# Patient Record
Sex: Female | Born: 1940 | Race: Black or African American | Hispanic: No | Smoking: Never smoker
Health system: Southern US, Community
[De-identification: ages and names within clinical notes are randomized; demographics above are authoritative.]

## PROBLEM LIST (undated history)

## (undated) DIAGNOSIS — F329 Major depressive disorder, single episode, unspecified: Secondary | ICD-10-CM

## (undated) DIAGNOSIS — E785 Hyperlipidemia, unspecified: Secondary | ICD-10-CM

## (undated) DIAGNOSIS — R350 Frequency of micturition: Secondary | ICD-10-CM

## (undated) DIAGNOSIS — K219 Gastro-esophageal reflux disease without esophagitis: Secondary | ICD-10-CM

## (undated) DIAGNOSIS — F0390 Unspecified dementia without behavioral disturbance: Secondary | ICD-10-CM

## (undated) DIAGNOSIS — I10 Essential (primary) hypertension: Secondary | ICD-10-CM

## (undated) DIAGNOSIS — K759 Inflammatory liver disease, unspecified: Secondary | ICD-10-CM

## (undated) DIAGNOSIS — F419 Anxiety disorder, unspecified: Secondary | ICD-10-CM

## (undated) DIAGNOSIS — F32A Depression, unspecified: Secondary | ICD-10-CM

## (undated) DIAGNOSIS — M810 Age-related osteoporosis without current pathological fracture: Secondary | ICD-10-CM

## (undated) DIAGNOSIS — H01009 Unspecified blepharitis unspecified eye, unspecified eyelid: Secondary | ICD-10-CM

## (undated) DIAGNOSIS — T783XXA Angioneurotic edema, initial encounter: Secondary | ICD-10-CM

## (undated) DIAGNOSIS — T464X5A Adverse effect of angiotensin-converting-enzyme inhibitors, initial encounter: Secondary | ICD-10-CM

## (undated) DIAGNOSIS — R011 Cardiac murmur, unspecified: Secondary | ICD-10-CM

## (undated) HISTORY — DX: Essential (primary) hypertension: I10

## (undated) HISTORY — DX: Depression, unspecified: F32.A

## (undated) HISTORY — DX: Angioneurotic edema, initial encounter: T78.3XXA

## (undated) HISTORY — DX: Adverse effect of angiotensin-converting-enzyme inhibitors, initial encounter: T46.4X5A

## (undated) HISTORY — DX: Hyperlipidemia, unspecified: E78.5

## (undated) HISTORY — DX: Anxiety disorder, unspecified: F41.9

## (undated) HISTORY — DX: Major depressive disorder, single episode, unspecified: F32.9

## (undated) HISTORY — PX: THYROIDECTOMY: SHX17

---

## 1997-04-07 ENCOUNTER — Encounter: Admission: RE | Admit: 1997-04-07 | Discharge: 1997-04-07 | Payer: Self-pay | Admitting: Sports Medicine

## 1997-09-15 ENCOUNTER — Encounter: Admission: RE | Admit: 1997-09-15 | Discharge: 1997-09-15 | Payer: Self-pay | Admitting: Family Medicine

## 1998-03-06 ENCOUNTER — Emergency Department (HOSPITAL_COMMUNITY): Admission: EM | Admit: 1998-03-06 | Discharge: 1998-03-06 | Payer: Self-pay | Admitting: Emergency Medicine

## 1998-06-03 ENCOUNTER — Ambulatory Visit (HOSPITAL_COMMUNITY): Admission: RE | Admit: 1998-06-03 | Discharge: 1998-06-03 | Payer: Self-pay | Admitting: Gastroenterology

## 1998-09-02 ENCOUNTER — Emergency Department (HOSPITAL_COMMUNITY): Admission: EM | Admit: 1998-09-02 | Discharge: 1998-09-02 | Payer: Self-pay | Admitting: Emergency Medicine

## 1999-08-05 ENCOUNTER — Other Ambulatory Visit: Admission: RE | Admit: 1999-08-05 | Discharge: 1999-08-05 | Payer: Self-pay | Admitting: Internal Medicine

## 1999-08-12 ENCOUNTER — Ambulatory Visit (HOSPITAL_COMMUNITY): Admission: RE | Admit: 1999-08-12 | Discharge: 1999-08-12 | Payer: Self-pay | Admitting: Internal Medicine

## 1999-08-12 ENCOUNTER — Encounter: Payer: Self-pay | Admitting: Internal Medicine

## 1999-08-24 ENCOUNTER — Encounter: Admission: RE | Admit: 1999-08-24 | Discharge: 1999-08-24 | Payer: Self-pay | Admitting: Internal Medicine

## 1999-08-24 ENCOUNTER — Encounter: Payer: Self-pay | Admitting: Internal Medicine

## 2000-08-28 ENCOUNTER — Other Ambulatory Visit: Admission: RE | Admit: 2000-08-28 | Discharge: 2000-08-28 | Payer: Self-pay | Admitting: Internal Medicine

## 2000-10-02 ENCOUNTER — Encounter: Admission: RE | Admit: 2000-10-02 | Discharge: 2000-10-02 | Payer: Self-pay | Admitting: Internal Medicine

## 2000-10-02 ENCOUNTER — Encounter: Payer: Self-pay | Admitting: Internal Medicine

## 2000-10-02 ENCOUNTER — Encounter: Admission: RE | Admit: 2000-10-02 | Discharge: 2000-12-31 | Payer: Self-pay | Admitting: Internal Medicine

## 2001-11-26 ENCOUNTER — Encounter: Admission: RE | Admit: 2001-11-26 | Discharge: 2001-11-26 | Payer: Self-pay | Admitting: Family Medicine

## 2001-11-26 ENCOUNTER — Encounter: Payer: Self-pay | Admitting: Family Medicine

## 2003-09-12 ENCOUNTER — Emergency Department (HOSPITAL_COMMUNITY): Admission: EM | Admit: 2003-09-12 | Discharge: 2003-09-12 | Payer: Self-pay | Admitting: Emergency Medicine

## 2005-03-04 ENCOUNTER — Encounter: Admission: RE | Admit: 2005-03-04 | Discharge: 2005-03-04 | Payer: Self-pay | Admitting: Family Medicine

## 2005-03-21 ENCOUNTER — Emergency Department (HOSPITAL_COMMUNITY): Admission: EM | Admit: 2005-03-21 | Discharge: 2005-03-22 | Payer: Self-pay | Admitting: Emergency Medicine

## 2005-04-25 ENCOUNTER — Emergency Department (HOSPITAL_COMMUNITY): Admission: EM | Admit: 2005-04-25 | Discharge: 2005-04-26 | Payer: Self-pay | Admitting: Emergency Medicine

## 2005-10-05 ENCOUNTER — Other Ambulatory Visit: Admission: RE | Admit: 2005-10-05 | Discharge: 2005-10-05 | Payer: Self-pay | Admitting: Family Medicine

## 2006-03-29 ENCOUNTER — Encounter: Admission: RE | Admit: 2006-03-29 | Discharge: 2006-03-29 | Payer: Self-pay | Admitting: Family Medicine

## 2007-05-02 ENCOUNTER — Encounter: Admission: RE | Admit: 2007-05-02 | Discharge: 2007-05-02 | Payer: Self-pay | Admitting: Family Medicine

## 2007-05-03 ENCOUNTER — Emergency Department (HOSPITAL_COMMUNITY): Admission: EM | Admit: 2007-05-03 | Discharge: 2007-05-03 | Payer: Self-pay | Admitting: Emergency Medicine

## 2009-01-12 ENCOUNTER — Encounter: Payer: Self-pay | Admitting: Family Medicine

## 2009-01-12 ENCOUNTER — Ambulatory Visit: Payer: Self-pay | Admitting: Family Medicine

## 2009-01-12 DIAGNOSIS — Z9089 Acquired absence of other organs: Secondary | ICD-10-CM | POA: Insufficient documentation

## 2009-01-12 DIAGNOSIS — I10 Essential (primary) hypertension: Secondary | ICD-10-CM

## 2009-01-12 DIAGNOSIS — R093 Abnormal sputum: Secondary | ICD-10-CM

## 2009-01-12 DIAGNOSIS — F324 Major depressive disorder, single episode, in partial remission: Secondary | ICD-10-CM

## 2009-01-12 DIAGNOSIS — Z794 Long term (current) use of insulin: Secondary | ICD-10-CM

## 2009-01-12 DIAGNOSIS — E1165 Type 2 diabetes mellitus with hyperglycemia: Secondary | ICD-10-CM

## 2009-01-12 DIAGNOSIS — E785 Hyperlipidemia, unspecified: Secondary | ICD-10-CM | POA: Insufficient documentation

## 2009-01-12 DIAGNOSIS — F411 Generalized anxiety disorder: Secondary | ICD-10-CM | POA: Insufficient documentation

## 2009-01-12 HISTORY — DX: Essential (primary) hypertension: I10

## 2009-01-12 LAB — CONVERTED CEMR LAB
ALT: 32 units/L (ref 0–35)
Alkaline Phosphatase: 99 units/L (ref 39–117)
BUN: 20 mg/dL (ref 6–23)
Creatinine, Ser: 1.05 mg/dL (ref 0.40–1.20)
Sodium: 143 meq/L (ref 135–145)
Total CHOL/HDL Ratio: 3.6
Triglycerides: 169 mg/dL — ABNORMAL HIGH (ref <150)
VLDL: 34 mg/dL (ref 0–40)

## 2009-01-28 ENCOUNTER — Ambulatory Visit: Payer: Self-pay | Admitting: Family Medicine

## 2009-01-28 LAB — CONVERTED CEMR LAB
Blood in Urine, dipstick: NEGATIVE
Nitrite: NEGATIVE
Protein, U semiquant: NEGATIVE
Urobilinogen, UA: 0.2

## 2009-04-28 ENCOUNTER — Telehealth: Payer: Self-pay | Admitting: *Deleted

## 2009-05-15 ENCOUNTER — Ambulatory Visit: Payer: Self-pay | Admitting: Family Medicine

## 2009-05-15 ENCOUNTER — Encounter: Payer: Self-pay | Admitting: Family Medicine

## 2009-05-15 DIAGNOSIS — K5900 Constipation, unspecified: Secondary | ICD-10-CM | POA: Insufficient documentation

## 2009-05-15 LAB — CONVERTED CEMR LAB
AST: 36 units/L (ref 0–37)
CO2: 24 meq/L (ref 19–32)
Calcium: 9.1 mg/dL (ref 8.4–10.5)
Chloride: 104 meq/L (ref 96–112)
Hgb A1c MFr Bld: 7.1 %
Sodium: 141 meq/L (ref 135–145)
Total Bilirubin: 0.7 mg/dL (ref 0.3–1.2)
Total Protein: 7.4 g/dL (ref 6.0–8.3)

## 2009-05-19 ENCOUNTER — Encounter: Admission: RE | Admit: 2009-05-19 | Discharge: 2009-05-19 | Payer: Self-pay | Admitting: Family Medicine

## 2009-09-21 ENCOUNTER — Encounter: Payer: Self-pay | Admitting: Family Medicine

## 2009-09-21 ENCOUNTER — Ambulatory Visit: Payer: Self-pay | Admitting: Family Medicine

## 2009-09-21 DIAGNOSIS — M810 Age-related osteoporosis without current pathological fracture: Secondary | ICD-10-CM | POA: Insufficient documentation

## 2009-09-21 LAB — CONVERTED CEMR LAB
Creatinine, Ser: 1.11 mg/dL (ref 0.40–1.20)
Glucose, Bld: 209 mg/dL — ABNORMAL HIGH (ref 70–99)
Hgb A1c MFr Bld: 8 %
Vit D, 25-Hydroxy: 56 ng/mL (ref 30–89)

## 2009-09-30 ENCOUNTER — Ambulatory Visit: Payer: Self-pay | Admitting: Family Medicine

## 2009-09-30 DIAGNOSIS — G309 Alzheimer's disease, unspecified: Secondary | ICD-10-CM

## 2009-09-30 DIAGNOSIS — F411 Generalized anxiety disorder: Secondary | ICD-10-CM | POA: Insufficient documentation

## 2009-09-30 DIAGNOSIS — F0281 Dementia in other diseases classified elsewhere with behavioral disturbance: Secondary | ICD-10-CM

## 2009-10-20 ENCOUNTER — Telehealth: Payer: Self-pay | Admitting: Family Medicine

## 2009-10-30 ENCOUNTER — Encounter: Payer: Self-pay | Admitting: Family Medicine

## 2009-11-25 ENCOUNTER — Ambulatory Visit: Payer: Self-pay | Admitting: Family Medicine

## 2009-11-25 ENCOUNTER — Encounter: Payer: Self-pay | Admitting: Family Medicine

## 2009-11-25 LAB — CONVERTED CEMR LAB
Creatinine, Ser: 1.1 mg/dL (ref 0.40–1.20)
Potassium: 3.5 meq/L (ref 3.5–5.3)
Sodium: 143 meq/L (ref 135–145)

## 2010-01-24 ENCOUNTER — Encounter: Payer: Self-pay | Admitting: Family Medicine

## 2010-02-02 NOTE — Assessment & Plan Note (Signed)
Summary: Mood, memory, hypertension    Vital Signs:  Patient profile:   70 year old female Height:      64 inches Weight:      122 pounds BMI:     21.02 BSA:     1.59 Temp:     98.1 degrees F Pulse rate:   69 / minute BP sitting:   141 / 76  Vitals Entered By: Jone Baseman CMA (September 30, 2009 10:29 AM) CC: Discuss memory, Hypertension Management, Depression Is Patient Diabetic? No Pain Assessment Patient in pain? no        Primary Care Provider:  Doree Albee MD  CC:  Discuss memory, Hypertension Management, and Depression.  History of Present Illness: 62 YOF with PMHx/o hypertension, diabetes, recent diagnosis of generalized anxiety here for followup visit on hypetension and memory issues.    Depression History:      The patient comes in today for her first follow up visit for depression.  The patient denies a depressed mood most of the day and a diminished interest in her usual daily activities.  The patient denies insomnia, hypersomnia, psychomotor agitation, fatigue (loss of energy), and feelings of worthlessness (guilt).        Comments:  Mood: Pt/daughter report moderate improvement in mood since starting buspar. Feels that medication has helped.  No HI/SI per pt. .  Hypertension History:      She denies headache, chest pain, palpitations, dyspnea with exertion, orthopnea, peripheral edema, and visual symptoms.        Positive major cardiovascular risk factors include female age 73 years old or older, diabetes, hyperlipidemia, and hypertension.  Negative major cardiovascular risk factors include non-tobacco-user status.       Habits & Providers  Alcohol-Tobacco-Diet     Tobacco Status: never  Current Medications (verified): 1)  Hydrochlorothiazide 25 Mg Tabs (Hydrochlorothiazide) .... Take 1 Tablet Daily 2)  Protonix 40 Mg Tbec (Pantoprazole Sodium) .... Take 1 Tablet Daily 3)  Aspir-Low 81 Mg Tbec (Aspirin) 4)  Buspirone Hcl 10 Mg Tabs (Buspirone  Hcl) .... Take 1 Tablet Twice Daily 5)  Miralax  Powd (Polyethylene Glycol 3350) .Marland Kitchen.. 1 Capful in 8 Ozs Water Daily 6)  Aricept 5 Mg Tabs (Donepezil Hcl) .... Take 1 Tablet Nightly  Allergies: No Known Drug Allergies  Physical Exam  General:  alert.   Head:  normocephalic and atraumatic.   Eyes:  vision grossly intact; glasses in place  Ears:  R ear normal and L ear normal.   Nose:  no external deformity.   Mouth:  good dentition.   Neck:  supple and full ROM.   Lungs:  CTAB, no wheezes, rales, rhoncii  Heart:  RRR, no rubs, gallops, murmurs  Abdomen:  S/NT/+ bowel sounds  Extremities:  2+ peripheral pulses, trace edema  Neurologic:  alert & oriented X3.    Diabetes Management Exam:    Foot Exam (with socks and/or shoes not present):       Sensory-Pinprick/Light touch:          Left medial foot (L-4): normal          Left dorsal foot (L-5): normal          Left lateral foot (S-1): normal          Right medial foot (L-4): normal          Right dorsal foot (L-5): normal          Right lateral foot (S-1): normal  Sensory-Monofilament:          Left foot: normal          Right foot: normal       Inspection:          Left foot: normal          Right foot: normal       Nails:          Left foot: normal          Right foot: normal   Impression & Recommendations:  Problem # 1:  ANXIETY DISORDER, GENERALIZED (ICD-300.02) Overall mood improved. Will reassess at next clinical visit in 1 month.  Her updated medication list for this problem includes:    Buspirone Hcl 10 Mg Tabs (Buspirone hcl) .Marland Kitchen... Take 1 tablet twice daily  Problem # 2:  DEMENTIA, MILD (ICD-294.8) MMSE 22/28. Will start patient aricept for memory. Will reassess for improvement in overall memory at next visit and subsequent visits. Also addressed side effect profile of aricept with patient. Pt/daughter agreeable to plan.   Problem # 3:  HYPERTENSION (ICD-401.9) Blood pressures minimally elevated will  consider starting on norvasc if SBP >140 at next clinical visit. Pt agreeable to plan.  Her updated medication list for this problem includes:    Hydrochlorothiazide 25 Mg Tabs (Hydrochlorothiazide) .Marland Kitchen... Take 1 tablet daily  Problem # 4:  DIABETES MELLITUS, TYPE II (ICD-250.00) Overall CBGs in mid-upper 100s. Reencouraged tid-qid blood sugar testing as well as low-carb diet.  Pt diet controlled in the past. Will reassess need for medical management with nexy A1C. Will also refer to podiatry.  Her updated medication list for this problem includes:    Aspir-low 81 Mg Tbec (Aspirin)  Orders: Podiatry Referral (Podiatry)  Complete Medication List: 1)  Hydrochlorothiazide 25 Mg Tabs (Hydrochlorothiazide) .... Take 1 tablet daily 2)  Protonix 40 Mg Tbec (Pantoprazole sodium) .... Take 1 tablet daily 3)  Aspir-low 81 Mg Tbec (Aspirin) 4)  Buspirone Hcl 10 Mg Tabs (Buspirone hcl) .... Take 1 tablet twice daily 5)  Miralax Powd (Polyethylene glycol 3350) .Marland Kitchen.. 1 capful in 8 ozs water daily 6)  Aricept 5 Mg Tabs (Donepezil hcl) .... Take 1 tablet nightly  Hypertension Assessment/Plan:      The patient's hypertensive risk group is category C: Target organ damage and/or diabetes.  Her calculated 10 year risk of coronary heart disease is 24 %.  Today's blood pressure is 141/76.  Her blood pressure goal is < 140/80.  Patient Instructions: 1)  It was good to see you again today 2)  We will start you on medication for your memory 3)  Check your blood sugars and blood pressures everyday 4)  Your mood seems to be better  5)  Come back to see me in 1 month to see if your mood has improved 6)  Otherwise call for any questions Prescriptions: ARICEPT 5 MG TABS (DONEPEZIL HCL) take 1 tablet nightly  #30 x 3   Entered and Authorized by:   Doree Albee MD   Signed by:   Doree Albee MD on 09/30/2009   Method used:   Electronically to        [facility] [phone]* (retail)       [address]       [place], [place]  [address]       Ph: [phone]       Fax: [phone]   RxID:   [id number]

## 2010-02-02 NOTE — Assessment & Plan Note (Signed)
Summary: f/u,df   Vital Signs:  Patient profile:   70 year old female Height:      64 inches Weight:      120 pounds BMI:     20.67 Temp:     98.3 degrees F oral Pulse rate:   96 / minute BP sitting:   131 / 82  (right arm) Cuff size:   regular  Vitals Entered By: Tessie Fass, CMA CC: F/U diabetes Is Patient Diabetic? Yes Pain Assessment Patient in pain? no        Primary Care Provider:  Doree Albee MD  CC:  F/U diabetes.  History of Present Illness: 1 YOF w/ PMHx/o HTN, DM, GERD here for new patient visit followup: 1. HTN: BP much improved in setting of starting HCTZ; almost at goal. Pt asymptomatic at baseline, and currently w/o symptomatology. Pt is asking about eye check as pt has not been to eye doctor in several years. Pt has been wearing glasses "for as long as I can remember" with no acute worsening of vision since dx of HTN and DM.  2. DM: HbA1C at 7.0 01/12/09. Pt states that blood sugars are usually very good.  3. GERD: Pt initially presented at new pt visit w/ complaint of persistent oral secretions, especially after eating in setting of remote hx/o reflux in past. Pt with marked improvement in symptomatology since beginning daily protonix.     Habits & Providers  Alcohol-Tobacco-Diet     Tobacco Status: never  Allergies: No Known Drug Allergies  Physical Exam  General:  alert and underweight appearing.   Head:  normocephalic and atraumatic.   Eyes:  vision grossly intact.  + glasses  Nose:  no external deformity.   Mouth:  good dentition and no gingival abnormalities.   Neck:  supple and full ROM.   Lungs:  normal respiratory effort, no intercostal retractions, no accessory muscle use, and normal breath sounds.   Heart:  normal rate, regular rhythm, no murmur, and no gallop.   Abdomen:  soft, non-tender, and normal bowel sounds.   Extremities:  no edema Neurologic:  alert & oriented X3.  neuro exam grossly intact.    Impression &  Recommendations:  Problem # 1:  HYPERTENSION (ICD-401.9) BP currently at goal. Will check UA for proteinuria to assess pt need for ACEi for renal protection. Otherwise pt will followup in 3-6 months.  Her updated medication list for this problem includes:    Hydrochlorothiazide 25 Mg Tabs (Hydrochlorothiazide) .Marland Kitchen... Take 1 tablet daily  Orders: Urinalysis-[facility] (00000) [facility]- Est Level  3 ([address])  Problem # 2:  ABNORMAL SPUTUM (ICD-786.4) Likely secondary to GERD in setting of protonix regimen, WIll continue current regimen.   Problem # 3:  DIABETES MELLITUS, TYPE II (ICD-250.00) Stable. Will reassess in 3-6 months.  Orders: Ophthalmology Referral (Ophthalmology) Urinalysis-[facility] (00000) [facility]- Est Level  3 ([address]454)  Problem # 4:  Preventive Health Care (ICD-V70.0) Will obtain records from office of Dr. Laurann Montana as pt had longstanding care from this primary care physician.   Complete Medication List: 1)  Hydrochlorothiazide 25 Mg Tabs (Hydrochlorothiazide) .... Take 1 tablet daily 2)  Protonix 40 Mg Tbec (Pantoprazole sodium) .... Take 1 tablet daily   Prevention & Chronic Care Immunizations   Influenza vaccine: Fluvax MCR  (01/12/2009)    Tetanus booster: Not documented    Pneumococcal vaccine: Pneumovax  (01/12/2009)    H. zoster vaccine: Not documented  Colorectal Screening   Hemoccult: Not documented  Colonoscopy: Not documented  Other Screening   Pap smear: Not documented    Mammogram: Not documented    DXA bone density scan: Not documented   Smoking status: never  (01/28/2009)  Diabetes Mellitus   HgbA1C: 7.0  (01/12/2009)    Eye exam: Not documented   Diabetic eye exam action/deferral: Ophthalmology referral  (01/28/2009)    Foot exam: yes  (01/12/2009)   High risk foot: Not documented   Foot care education: Not documented    Urine microalbumin/creatinine ratio: Not documented    Diabetes flowsheet reviewed?: Yes   Progress toward A1C goal:  Unchanged  Lipids   Total Cholesterol: 206  (01/12/2009)   Lipid panel action/deferral: Lipid Panel ordered   LDL: 115  (01/12/2009)   LDL Direct: Not documented   HDL: 57  (01/12/2009)   Triglycerides: 169  (01/12/2009)    SGOT (AST): 33  (01/12/2009)   SGPT (ALT): 32  (01/12/2009)   Alkaline phosphatase: 99  (01/12/2009)   Total bilirubin: 0.5  (01/12/2009)    Lipid flowsheet reviewed?: Yes   Progress toward LDL goal: Unchanged  Hypertension   Last Blood Pressure: 131 / 82  (01/28/2009)   Serum creatinine: 1.05  (01/12/2009)   Serum potassium 4.2  (01/12/2009)    Hypertension flowsheet reviewed?: Yes   Progress toward BP goal: At goal  Self-Management Support :    Diabetes self-management support: Written self-care plan  (01/28/2009)   Diabetes care plan printed    Hypertension self-management support: Written self-care plan  (01/28/2009)   Hypertension self-care plan printed.    Lipid self-management support: Written self-care plan  (01/28/2009)   Lipid self-care plan printed.   Nursing Instructions: Refer for screening diabetic eye exam (see order)   Laboratory Results   Urine Tests  Date/Time Received: January 28, 2009 4:27 PM  Date/Time Reported: January 28, 2009 4:32 PM   Routine Urinalysis   Color: yellow Appearance: Clear Glucose: negative   (Normal Range: Negative) Bilirubin: negative   (Normal Range: Negative) Ketone: trace (5)   (Normal Range: Negative) Spec. Gravity: 1.025   (Normal Range: 1.003-1.035) Blood: negative   (Normal Range: Negative) pH: 5.5   (Normal Range: 5.0-8.0) Protein: negative   (Normal Range: Negative) Urobilinogen: 0.2   (Normal Range: 0-1) Nitrite: negative   (Normal Range: Negative) Leukocyte Esterace: negative   (Normal Range: Negative)    Comments: ...............test performed by......Marland KitchenBonnie A. [place], MLS (ASCP)cm

## 2010-02-02 NOTE — Assessment & Plan Note (Signed)
Summary: F/U VISIT/[facility]   Vital Signs:  Patient profile:   70 year old female Height:      64 inches Weight:      124 pounds BMI:     21.36 Temp:     98.5 degrees F oral Pulse rate:   88 / minute BP sitting:   145 / 75  (left arm) Cuff size:   regular  Vitals Entered By: Tessie Fass CMA (September 21, 2009 1:47 PM) CC: F/U, Depression, Hypertension Management Is Patient Diabetic? Yes Pain Assessment Patient in pain? no        Primary Care Provider:  Doree Albee MD  CC:  F/U, Depression, and Hypertension Management.  History of Present Illness: 83 YOF w/ multiple medical problems here for followup visit: DM: Pt denies polyuria, polydypsia, hyperphagia or other systemic sxs. Has recently picked up sugar intake per daughter; drinking sweetened drinks and more deserts.   Memory: Daughter reports pt forgetting placement of things such as keys and money on a regular basis. Able to perform ADLs on a regular basis. HAs not forgotten names of family or places of repeated travel.   Depression History:      Positive alarm features for depression include insomnia.  However, she denies significant weight loss and feelings of worthlessness (guilt).  The patient denies symptoms of a manic disorder including persistently & abnormally elevated mood, talkative or feels need to keep talking, inflated self-esteem or grandiosity, and excessive buying sprees.        The patient denies that she feels like life is not worth living, denies that she wishes that she were dead, and denies that she has thought about ending her life.        Comments:  Daughter reports hx/o paranoia at multiple previous residencies; acusing neighbors of wanting to get her money-being out to get her. .  Hypertension History:      She denies headache, chest pain, palpitations, dyspnea with exertion, orthopnea, PND, peripheral edema, and visual symptoms.  Further comments include: Has not been compliant in using HCTZ since  last clincinal visit per daughter. Marland Kitchen        Positive major cardiovascular risk factors include female age 51 years old or older, diabetes, hyperlipidemia, and hypertension.  Negative major cardiovascular risk factors include non-tobacco-user status.           Habits & Providers  Alcohol-Tobacco-Diet     Tobacco Status: never  Allergies: No Known Drug Allergies  Physical Exam  General:  alert and well-developed.   Head:  normocephalic and atraumatic.   Eyes:  vision grossly intact, pupils equal, pupils round, and pupils reactive to light.   Ears:  R ear normal and L ear normal.   Mouth:  good dentition.   Neck:  supple and full ROM, no JVD Lungs:  CTAB, no wheezes, rales, rhoncii  Heart:  RRR, no rubs, gallops, murmurs Abdomen:  non-tender, normal bowel sounds, and no distention.   Extremities:  no edema, 2+ peripheral pulses  Neurologic:  alert & oriented X3.     Impression & Recommendations:  Problem # 1:  HYPERTENSION (ICD-401.9) Not compliant on regimen. Reinforced need for comlinace w/ regimen. Will obtain BMET to asses renal function. Daughter agreeable to reinforcing plan at home in addition to pt being agreeable to plan. Hypertension red flags reviewed.  Her updated medication list for this problem includes:    Hydrochlorothiazide 25 Mg Tabs (Hydrochlorothiazide) .Marland Kitchen... Take 1 tablet daily  Orders: Basic Met-[facility] [phone])  Problem # 2:  DEPRESSION (ICD-311) Overall symptomatology consitent with GAD. Will start pt on Buspar for appropriate treatment. Will reassess in 2-4 weeks. Pt agreeable to plan.  Will also reassess memory at next clinical visit w/ MMSE.  Her updated medication list for this problem includes:    Buspirone Hcl 10 Mg Tabs (Buspirone hcl) .Marland Kitchen... Take 1 tablet twice daily  Problem # 3:  DIABETES MELLITUS, TYPE II (ICD-250.00) Plan to obtian A1C and BMET. Will consider starting metformin pending A1C. Also stressed need to check CBGs at home as well  as decrease CHO intake. Pt agreeable to plan.  Her updated medication list for this problem includes:    Aspir-low 81 Mg Tbec (Aspirin)  Orders: A1C-[facility] (52841) Ophthalmology Referral (Ophthalmology) UA Microalbumin-[facility] [phone])  Problem # 4:  CONSTIPATION (ICD-564.00) Pt ionstructed to start taking miralax daily as pt reports worsening in constipation as pt has been noncompliant w/ miralax use.  Her updated medication list for this problem includes:    Miralax Powd (Polyethylene glycol 3350) .Marland Kitchen... 1 capful in 8 ozs water daily  Problem # 5:  OSTEOPOROSIS (ICD-733.00) Will check calcium and vit D level. Pt reports intermittent use of oscal. Encouraged daily use. Wil consider starting of bisphosphinate pending lab results. Pt agreeable to plan.  Orders: Vit D, 25 [place]-[facility] ([id number])  Complete Medication List: 1)  Hydrochlorothiazide 25 Mg Tabs (Hydrochlorothiazide) .... Take 1 tablet daily 2)  Protonix 40 Mg Tbec (Pantoprazole sodium) .... Take 1 tablet daily 3)  Aspir-low 81 Mg Tbec (Aspirin) 4)  Buspirone Hcl 10 Mg Tabs (Buspirone hcl) .... Take 1 tablet twice daily 5)  Miralax Powd (Polyethylene glycol 3350) .Marland Kitchen.. 1 capful in 8 ozs water daily  Other Orders: Pap Smear-[facility] ([id number])  Hypertension Assessment/Plan:      The patient's hypertensive risk group is category C: Target organ damage and/or diabetes.  Her calculated 10 year risk of coronary heart disease is 24 %.  Today's blood pressure is 145/75.    Patient Instructions: 1)  It was to see you again  2)  We will check some labs for your blood pressure and diabetes 3)  We will also check some labs for your osteoporosis 4)  These are the medications that I will call in for you to take daily: 5)  HCTZ 25 mg daily 6)  Miralax 1 capful in 8 ozs of water daily 7)  BuSpar 10 mg twice daily 8)  Protonix 40 mg daily  9)  -Take Oscal daily for your osteoporosis; we may start you on an additional medication depending on what  your labs show 10)  In terms of diet: 11)  Limit your Sodium(salt) intake 12)  Decrease your sugar intake 13)  Come back to see me in 1 week 14)  We will talk about your memory and we will do a pap smear 15)  Otherwise call for any questions, 16)  God Bless Prescriptions: ASPIR-LOW 81 MG TBEC (ASPIRIN)   #30 x 3   Entered and Authorized by:   Doree Albee MD   Signed by:   Doree Albee MD on 09/21/2009   Method used:   Print then Give to Patient   RxID:   [id number] PROTONIX 40 MG TBEC (PANTOPRAZOLE SODIUM) take 1 tablet daily  #30 x 1   Entered and Authorized by:   Doree Albee MD   Signed by:   Doree Albee MD on 09/21/2009   Method used:   Print then Give to Patient  RxID:   [id number] HYDROCHLOROTHIAZIDE 25 MG TABS (HYDROCHLOROTHIAZIDE) take 1 tablet daily  #30 x 1   Entered and Authorized by:   Doree Albee MD   Signed by:   Doree Albee MD on 09/21/2009   Method used:   Print then Give to Patient   RxID:   [id number] MIRALAX  POWD (POLYETHYLENE GLYCOL 3350) 1 capful in 8 ozs water daily  #86month(qs) x 3   Entered and Authorized by:   Doree Albee MD   Signed by:   Doree Albee MD on 09/21/2009   Method used:   Print then Give to Patient   RxID:   [id number] BUSPIRONE HCL 10 MG TABS (BUSPIRONE HCL) take 1 tablet twice daily  #60 x 3   Entered and Authorized by:   Doree Albee MD   Signed by:   Doree Albee MD on 09/21/2009   Method used:   Print then Give to Patient   RxID:   [id number]   Laboratory Results   Urine Tests  Date/Time Received: September 21, 2009 2:50 PM  Date/Time Reported: September 21, 2009 3:30 PM   Microalbumin (urine): 30 mg/L Creatinine: 200mg /dL  A:C Ratio <63 Normal Comments: ...............test performed by......Marland KitchenBonnie A. [place], MLS (ASCP)cm   Blood Tests   Date/Time Received: September 21, 2009 1:47 PM  Date/Time Reported: September 21, 2009 1:56 PM   HGBA1C: 8.0%   (Normal Range:  Non-Diabetic - 3-6%   Control Diabetic - 6-8%)  Comments: ...............test performed by......Marland KitchenBonnie A. [place], MLS (ASCP)cm      Prevention & Chronic Care Immunizations   Influenza vaccine: Fluvax MCR  (01/12/2009)    Tetanus booster: Not documented   Td booster deferral: Not indicated  (05/15/2009)    Pneumococcal vaccine: Pneumovax  (01/12/2009)    H. zoster vaccine: Not documented  Colorectal Screening   Hemoccult: Not documented    Colonoscopy: Not documented   Colonoscopy action/deferral: Not indicated  (05/15/2009)  Other Screening   Pap smear: Not documented   Pap smear action/deferral: Ordered  (09/21/2009)   Pap smear due: 10/02/2009    Mammogram: ASSESSMENT: Negative - BI-RADS 1^MM DIGITAL SCREENING  (05/19/2009)    DXA bone density scan: Not documented   DXA bone density action/deferral: Ordered  (05/15/2009)   Smoking status: never  (09/21/2009)  Diabetes Mellitus   HgbA1C: 8.0  (09/21/2009)    Eye exam: Not documented   Diabetic eye exam action/deferral: Ophthalmology referral  (09/21/2009)    Foot exam: yes  (05/15/2009)   Foot exam action/deferral: Do today   High risk foot: No  (05/15/2009)   Foot care education: Not documented    Urine microalbumin/creatinine ratio: Not documented   Urine microalbumin action/deferral: Ordered    Diabetes flowsheet reviewed?: Yes   Progress toward A1C goal: Deteriorated  Lipids   Total Cholesterol: 206  (01/12/2009)   Lipid panel action/deferral: Lipid Panel ordered   LDL: 115  (01/12/2009)   LDL Direct: Not documented   HDL: 57  (01/12/2009)   Triglycerides: 169  (01/12/2009)    SGOT (AST): 36  (05/15/2009)   SGPT (ALT): 28  (05/15/2009)   Alkaline phosphatase: 97  (05/15/2009)   Total bilirubin: 0.7  (05/15/2009)    Lipid flowsheet reviewed?: Yes   Progress toward LDL goal: Unchanged  Hypertension   Last Blood Pressure: 145 / 75  (09/21/2009)   Serum creatinine: 0.97  (05/15/2009)   Serum  potassium 3.7  (05/15/2009)    Hypertension flowsheet reviewed?: Yes   Progress toward BP  goal: Deteriorated  Self-Management Support :   Personal Goals (by the next clinic visit) :     Personal A1C goal: 6.5  (05/15/2009)     Personal blood pressure goal: 130/80  (05/15/2009)     Personal LDL goal: 100  (05/15/2009)    Diabetes self-management support: Written self-care plan  (05/15/2009)    Hypertension self-management support: Written self-care plan  (05/15/2009)    Lipid self-management support: Written self-care plan  (05/15/2009)    Nursing Instructions: Pap smear today Refer for screening diabetic eye exam (see order)     Appended Document: F/U VISIT/[facility]    Clinical Lists Changes  Orders: Added new Test order of [facility]- Est  Level 4 (14782) - Signed

## 2010-02-02 NOTE — Assessment & Plan Note (Signed)
Summary: np,df   Vital Signs:  Patient profile:   70 year old female Height:      64 inches Weight:      123.3 pounds BMI:     21.24 Temp:     98.5 degrees F oral Pulse rate:   84 / minute Pulse rhythm:   regular BP sitting:   168 / 93  (left arm) Cuff size:   regular  Vitals Entered By: Loralee Pacas CMA (January 12, 2009 3:09 PM) Comments pt. states that she has been having some problems with her throat she's been spitting up thick mucous x 2 months   Primary Care Provider:  Doree Albee MD   History of Present Illness: 41 YOF w/ PMHx/o Anxiety, Depression, Type 2 DM, HLD, HTN. Here for new pt visit.  States that she is here she can no longer get transportation to previous PCP as pt lives at ALF. Pt states that she has been spitting up thick fluid fro back of throat, usually assd w/ after eating. Pt desribes sputum and thick and whitish. No cough, wheezing. Pt describes sputum as non bloody. Pt states sxs have been going on for the last 2-3 months. Pt does report remote hx/o PPI use in past. However pt currently denies having GERD, or allergic rhinitis. Otherwise, pt denies any acute complaints(for full details please review ROS). Pt wishes to desire establishment of care.   Habits & Providers  Alcohol-Tobacco-Diet     Tobacco Status: never  Exercise-Depression-Behavior     Does Patient Exercise: no     Drug Use: no  Allergies (verified): No Known Drug Allergies  Past History:  Social History: Last updated: 01/12/2009 living in assisted living facility Retired Divorced Never Smoked Alcohol use-no Drug use-no Regular exercise-no  Past Medical History: Anxiety Depression Diabetes mellitus, type II Hyperlipidemia Hypertension  Family History: Family History of Alcoholism/Addiction Family History of Arthritis Family History of CAD Female 1st degree relative <60 Family History of Colon CA 1st degree relative <60 Family History Diabetes 1st degree  relative  Social History: living in assisted living facility Retired Divorced Never Smoked Alcohol use-no Drug use-no Regular exercise-no Drug Use:  no Blood Transfusions:  no Ethnicity:  Black Occupation:  None Transportation:  Therapist, music Residence:  Assisted Living Does Patient Exercise:  no Dental Care w/in 6 mos.:  no Alcohol:  None HIV Risk:  no risk noted Smoking Status:  never  Review of Systems  The patient denies decreased hearing, chest pain, syncope, dyspnea on exertion, peripheral edema, abdominal pain, severe indigestion/heartburn, incontinence, muscle weakness, difficulty walking, and depression.   Eyes:  Eye glasses in place. Pt w/ most recent optometry visit w/inlast 6 months. Vision at baseline. CV:  Denies fatigue, leg cramps with exertion, and palpitations. GI:  Denies bloody stools, change in bowel habits, constipation, nausea, and vomiting. GU:  Denies dysuria, incontinence, and urinary frequency. Endo:  Denies heat intolerance, polyuria, and weight change.  Physical Exam  General:  alert, well-nourished, and well-hydrated.   Head:  normocephalic and atraumatic.   Eyes:  vision grossly intact, pupils equal, pupils round, and pupils reactive to light.   Ears:  R ear normal and L ear normal.   Nose:  no external deformity.   Mouth:  good dentition.  Dentures in place Neck:  supple and full ROM.   Lungs:  normal respiratory effort, no accessory muscle use, and normal breath sounds.   Heart:  normal rate, regular rhythm, no murmur, no gallop, and  no rub.   Abdomen:  soft, non-tender, and normal bowel sounds.   Msk:  normal ROM.    Diabetes Management Exam:    Foot Exam (with socks and/or shoes not present):       Sensory-Pinprick/Light touch:          Left medial foot (L-4): normal          Left dorsal foot (L-5): normal          Left lateral foot (S-1): normal       Sensory-Monofilament:          Left foot: normal          Right foot:  normal       Inspection:          Left foot: normal          Right foot: normal       Nails:          Left foot: normal          Right foot: normal    Eye Exam:       Eye Exam done elsewhere   Impression & Recommendations:  Problem # 1:  Preventive Health Care (ICD-V70.0) In setting of establishment of care and past medical hx. Will obtain baseline labs including HbA1C, lipid profile, CMET to evaluate status of baseline chronic medical conditions. Apart from elevated BP, and increased sputum production. Pt otherwise clinically stable. Pt adisd to return for followup visit in theh next 1-2 weeks. Will aslo obtain prior medical records once more info recieved.  Problem # 2:  HYPERTENSION (ICD-401.9) intake SBP into 160s. Will start pt on HCTZ for BP management pending baseline renal function labs. Will consider transitioning HCTZ if pt baseline renal labs are grossly abnormal.  Her updated medication list for this problem includes:    Hydrochlorothiazide 25 Mg Tabs (Hydrochlorothiazide) .Marland Kitchen... Take 1 tablet daily  Problem # 3:  ABNORMAL SPUTUM (ICD-786.4) Likely secondary to GERD symptomatology. Will start pt on PPI w/ prompt followup in 2-4 weeks to evaluate for resolution of symptomatology.   Complete Medication List: 1)  Hydrochlorothiazide 25 Mg Tabs (Hydrochlorothiazide) .... Take 1 tablet daily 2)  Protonix 40 Mg Tbec (Pantoprazole sodium) .... Take 1 tablet daily  Other Orders: T-Lipid Profile [phone]) A1C-[facility] [phone]) Comp Met-[facility] [phone]) TSH-[facility] [phone]) Flu Shot (Medicare) [phone]) Pneumonia Shot (Medicare) [phone]) Influenza Vaccine MCR (24401) Pneumococcal Vaccine (02725) Admin 1st Vaccine (36644)  Patient Instructions: 1)  Please schedule a follow-up appointment in 2 weeks.  2)  See your eye doctor yearly to check for diabetic eye damage. 3)  Check your  Blood Pressure regularly . If it is above: 180/110   you should make an appointment. 4)  Thank you  for taking the time to see [place] today Prescriptions: PROTONIX 40 MG TBEC (PANTOPRAZOLE SODIUM) take 1 tablet daily  #30 x 1   Entered and Authorized by:   Doree Albee MD   Signed by:   Doree Albee MD on 01/12/2009   Method used:   Electronically to        [facility] [phone]* (retail)       [address]       [place], [place]  [address]       Ph: [phone]       Fax: [phone]   RxID:   [id number] HYDROCHLOROTHIAZIDE 25 MG TABS (HYDROCHLOROTHIAZIDE) take 1 tablet daily  #30 x 1   Entered and Authorized by:  Doree Albee MD   Signed by:   Doree Albee MD on 01/12/2009   Method used:   Electronically to        [facility] [phone]* (retail)       [address]       [place], [place]  [address]       Ph: [phone]       Fax: [phone]   RxID:   [id number]    Prevention & Chronic Care Immunizations   Influenza vaccine: Fluvax MCR  (01/12/2009)    Tetanus booster: Not documented    Pneumococcal vaccine: Pneumovax  (01/12/2009)    H. zoster vaccine: Not documented  Colorectal Screening   Hemoccult: Not documented    Colonoscopy: Not documented  Other Screening   Pap smear: Not documented    Mammogram: Not documented    DXA bone density scan: Not documented   Smoking status: never  (01/12/2009)  Diabetes Mellitus   HgbA1C: 7.0  (01/12/2009)    Eye exam: Not documented    Foot exam: yes  (01/12/2009)   High risk foot: Not documented   Foot care education: Not documented    Urine microalbumin/creatinine ratio: Not documented  Lipids   Total Cholesterol: Not documented   Lipid panel action/deferral: Lipid Panel ordered   LDL: Not documented   LDL Direct: Not documented   HDL: Not documented   Triglycerides: Not documented    SGOT (AST): Not documented   SGPT (ALT): Not documented CMP ordered    Alkaline phosphatase: Not documented   Total bilirubin: Not documented  Hypertension   Last Blood Pressure: 168 / 93   (01/12/2009)   Serum creatinine: Not documented   Serum potassium Not documented CMP ordered   Self-Management Support :    Diabetes self-management support: Written self-care plan  (01/12/2009)   Diabetes care plan printed    Hypertension self-management support: Written self-care plan  (01/12/2009)   Hypertension self-care plan printed.    Lipid self-management support: Written self-care plan  (01/12/2009)   Lipid self-care plan printed.   Nursing Instructions: Give Flu vaccine today Give Pneumovax today     Immunizations Administered:  Influenza Vaccine # 1:    Vaccine Type: Fluvax MCR    Mfr: GlaxoSmithKline    Dose: 0.5 ml    Route: IM    Given by: Gladstone Pih    Exp. Date: 07/02/2009    Lot #: [id number]    VIS given: 10/10    Physician counseled: yes  Pneumonia Vaccine:    Vaccine Type: Pneumovax    Site: right deltoid    Mfr: Merck    Dose: 0.5 ml    Route: IM    Given by: Gladstone Pih    Exp. Date: 12/13/2009    Lot #: [id number]    VIS given: 08/01/95 version given January 12, 2009.    Physician counseled: yes  Flu Vaccine Consent Questions:    Do you have a history of severe allergic reactions to this vaccine? no    Any prior history of allergic reactions to egg and/or gelatin? no    Do you have a sensitivity to the preservative Thimersol? no    Do you have a past history of Guillan-Barre Syndrome? no    Do you currently have an acute febrile illness? no    Have you ever had a severe reaction to latex? no    Vaccine information given and explained to patient? yes    Are you currently  pregnant? no  Laboratory Results   Blood Tests   Date/Time Received: January 12, 2009 4:15 PM  Date/Time Reported: January 12, 2009 4:50 PM   HGBA1C: 7.0%   (Normal Range: Non-Diabetic - 3-6%   Control Diabetic - 6-8%)  Comments: ...........test performed by...........Marland KitchenTerese Door, CMA

## 2010-02-02 NOTE — Assessment & Plan Note (Signed)
Summary: f/u,eo   Vital Signs:  Patient profile:   70 year old female Weight:      118.5 pounds Temp:     98.4 degrees F oral Pulse rate:   90 / minute Pulse rhythm:   regular BP sitting:   114 / 68  (left arm) Cuff size:   regular  Vitals Entered By: Loralee Pacas CMA (November 25, 2009 10:55 AM) CC: follow-up visit, Depression, Hypertension Management Is Patient Diabetic? Yes Did you bring your meter with you today? No   CC:  follow-up visit, Depression, and Hypertension Management.  Depression History:      Positive alarm features for depression include psychomotor agitation.  However, she denies significant weight loss, significant weight gain, feelings of worthlessness (guilt), and impaired concentration (indecisiveness).        The patient denies that she feels like life is not worth living, denies that she wishes that she were dead, and denies that she has thought about ending her life.        Comments:  Daughter reports pt w/ persisent paranoid behavior w/ pt knocking in neighbors doors to sleep/spend the night because she feels some on is "after her"/trying to break in to her appartment. Daughter states that pt has voluntarily moved 5-6 different communities over the last several years w/ similar concerns. However, there is no shown to be after her/breaking in o her apartment. .  Hypertension History:      She complains of visual symptoms, but denies headache, chest pain, palpitations, dyspnea with exertion, orthopnea, and peripheral edema.  Further comments include: visual symptoms felt to be related to glasses as pt feels she needs new rx. .        Positive major cardiovascular risk factors include female age 58 years old or older, diabetes, hyperlipidemia, and hypertension.  Negative major cardiovascular risk factors include non-tobacco-user status.       Habits & Providers  Alcohol-Tobacco-Diet     Tobacco Status: never  Exercise-Depression-Behavior     Have you  felt down or hopeless? yes     Have you felt little pleasure in things? no     Depression Counseling: further diagnostic testing and/or other treatment is indicated     Seat Belt Use: sometimes  Current Medications (verified): 1)  Hydrochlorothiazide 25 Mg Tabs (Hydrochlorothiazide) .... Take 1 Tablet Daily 2)  Protonix 40 Mg Tbec (Pantoprazole Sodium) .... Take 1 Tablet Daily 3)  Aspir-Low 81 Mg Tbec (Aspirin) 4)  Metformin Hcl 500 Mg Tabs (Metformin Hcl) .Marland Kitchen.. 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   Head:  normocephalic and atraumatic.   Eyes:  vision grossly intact.   Nose:  no external deformity.   Mouth:  good dentition.   Neck:  supple and full ROM. 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 Will increase aricept.  Plan for [facility] referral to evaluate functional status as well as to evaluate for any concerning behaviors. Daughter in process of applying for HCPOA. May need haldol/risperdol in the future. Will reassess pending HH assessment.  Orders: Home Health Referral [facility])  [facility]- Est  Level 4 (04540)  Problem # 2:  ANXIETY DISORDER, GENERALIZED (ICD-300.02) See problem #1. Will also increase wellbutrin.  Orders: Home Health Referral (Home Health) [facility]- Est  Level 4 (98119)  Her updated medication list for this problem includes:    Buspirone Hcl 15 Mg Tabs (Buspirone hcl) .Marland Kitchen... 1 tablet twice daily  Problem # 3:  DIABETES MELLITUS, TYPE II (ICD-250.00) Pt not checking blood sugars at home. Will check A1C. Most recent BMETw/ elevated blood sugar.  May need to start on metformin. Her updated medication list for this problem includes:    Aspir-low 81 Mg Tbec (Aspirin)    Metformin Hcl 500 Mg Tabs (Metformin hcl) .Marland Kitchen... 1 tablet daily  Orders: A1C-[facility] (14782) [facility]- Est  Level 4 (95621)  Problem # 4:  OSTEOPOROSIS (ICD-733.00) Will cont cal/vit D. Will start on fosamax Orders: [facility]- Est  Level 4 (30865)  Her updated medication list for this problem includes:    Calcium Citrate-vitamin D 315-200 Mg-unit Tabs (Calcium citrate-vitamin d) .Marland Kitchen... 2 tabs twice daily    Fosamax 70 Mg Tabs (Alendronate sodium) .Marland Kitchen... 1 tablet weekly  Complete Medication List: 1)  Hydrochlorothiazide 25 Mg Tabs (Hydrochlorothiazide) .... Take 1 tablet daily 2)  Protonix 40 Mg Tbec (Pantoprazole sodium) .... Take 1 tablet daily 3)  Aspir-low 81 Mg Tbec (Aspirin) 4)  Metformin Hcl 500 Mg Tabs (Metformin hcl) .Marland Kitchen.. 1 tablet daily 5)  Buspirone Hcl 15 Mg Tabs (Buspirone hcl) .Marland Kitchen.. 1 tablet twice daily 6)  Calcium Citrate-vitamin D 315-200 Mg-unit Tabs (Calcium citrate-vitamin d) .... 2 tabs twice daily 7)  Fosamax 70 Mg Tabs (Alendronate sodium) .Marland Kitchen.. 1 tablet weekly  Other Orders: Hemoccult Cards (Take Home) (Hemoccult Cards) Basic Met-[facility] [phone]) Influenza Vaccine MCR (84132)  Hypertension Assessment/Plan:      The patient's hypertensive risk group is category C: Target organ damage and/or diabetes.  Her calculated 10 year risk of coronary heart disease is 11 %.  Today's blood pressure is 114/68.  Her blood pressure goal is < 140/80.   Patient Instructions: 1)  It was good to see you today 2)  We will check some labs concerning blood pressure and diabetes 3)  We will likely need to place Ms. Sitts on medication for her diabetes 4)  I will be making a referral for home health to helop make sure that you are taking your medications like you should and make sure that other home concerns can be addressed  5)  I would also like to increase your wellbutrin  to help with your mood 6)   I will also add a medication for your bone density 7)  Come back to see me after the holidays or call if you have any other questions 8)  Happy Thanksgiving and God Bless, 9)  Doree Albee MD  Prescriptions: Mindi Slicker 70 MG TABS (ALENDRONATE SODIUM) 1 tablet weekly  #4 tabsqmonth x 6   Entered and Authorized by:   Doree Albee MD   Signed by:   Doree Albee MD on 11/28/2009   Method used:   Electronically to        [facility] [phone]* (retail)       [address]       [place], [place]  [address]       Ph: [phone]       Fax: [phone]   RxID:   [id number] CALCIUM CITRATE-VITAMIN D 315-200 MG-UNIT TABS (CALCIUM CITRATE-VITAMIN D) 2 tabs twice daily  #30 x 11  Entered and Authorized by:   Doree Albee MD   Signed by:   Doree Albee MD on 11/28/2009   Method used:   Electronically to        [facility] [phone]* (retail)       [address]       [place], [place]  [address]       Ph: [phone]       Fax: [phone]   RxID:   [id number] BUSPIRONE HCL 15 MG TABS (BUSPIRONE HCL) 1 tablet twice daily  #60 x 6   Entered and Authorized by:   Doree Albee MD   Signed by:   Doree Albee MD on 11/28/2009   Method used:   Electronically to        [facility] [phone]* (retail)       [address]       [place], [place]  [address]       Ph: [phone]       Fax: [phone]   RxID:   [id number] METFORMIN HCL 500 MG TABS (METFORMIN HCL) 1 tablet daily  #30 x 1   Entered and Authorized by:   Doree Albee MD   Signed by:   Doree Albee MD on 11/28/2009   Method used:   Electronically to        [facility] [phone]* (retail)       [address]       [place], [place]  [address]       Ph: [phone]       Fax: [phone]   RxID:   [id number]    Orders Added: 1)  Hemoccult Cards (Take Home) [Hemoccult Cards] 2)  Home Health Referral [facility]] 3)  Basic Met-[facility] [[address]-035[address]] 4)  A1C-[facility]  [83036] 5)  [facility]- Est  Level 4 [99214] 6)  Influenza Vaccine MCR [00025]   Immunizations Administered:  Influenza Vaccine # 1:    Vaccine Type: Fluvax MCR    Site: left deltoid    Mfr: GlaxoSmithKline    Dose: 0.5 ml    Route: IM    Given by: Loralee Pacas CMA    Exp. Date: 07/03/2010    Lot #: [id number]    VIS given: 07/28/09 version given November 25, 2009.  Flu Vaccine Consent Questions:    Do you have a history of severe allergic reactions to this vaccine? no    Any prior history of allergic reactions to egg and/or gelatin? no    Do you have a sensitivity to the preservative Thimersol? no    Do you have a past history of Guillan-Barre Syndrome? no    Do you currently have an acute febrile illness? no    Have you ever had a severe reaction to latex? no    Vaccine information given and explained to patient? yes    Are you currently pregnant? no   Immunizations Administered:  Influenza Vaccine # 1:    Vaccine Type: Fluvax MCR    Site: left deltoid    Mfr: GlaxoSmithKline    Dose: 0.5 ml    Route: IM    Given by: Loralee Pacas CMA    Exp. Date: 07/03/2010    Lot #: [id number]    VIS given: 07/28/09 version given November 25, 2009.   Prevention & Chronic Care Immunizations   Influenza vaccine: Fluvax MCR  (11/25/2009)    Tetanus booster: Not documented   Td booster deferral: Not indicated  (05/15/2009)  Pneumococcal vaccine: Pneumovax  (01/12/2009)    H. zoster vaccine: Not documented  Colorectal Screening   Hemoccult: Not documented   Hemoccult action/deferral: Ordered  (11/25/2009)    Colonoscopy: Not documented   Colonoscopy action/deferral: Not indicated  (05/15/2009)  Other Screening   Pap smear: Not documented   Pap smear action/deferral: Ordered  (09/21/2009)   Pap smear due: 10/02/2009    Mammogram: ASSESSMENT: Negative - BI-RADS 1^MM DIGITAL SCREENING  (05/19/2009)    DXA bone density scan: Not documented   DXA bone density action/deferral:  Ordered  (05/15/2009)   Smoking status: never  (11/25/2009)  Diabetes Mellitus   HgbA1C: 7.8  (11/25/2009)   HgbA1C action/deferral: Ordered  (11/25/2009)    Eye exam: Not documented   Diabetic eye exam action/deferral: Ophthalmology referral  (09/21/2009)    Foot exam: yes  (09/30/2009)   Foot exam action/deferral: Do today   High risk foot: No  (05/15/2009)   Foot care education: Not documented    Urine microalbumin/creatinine ratio: Not documented   Urine microalbumin action/deferral: Ordered    Diabetes flowsheet reviewed?: Yes   Progress toward A1C goal: Unchanged  Lipids   Total Cholesterol: 206  (01/12/2009)   Lipid panel action/deferral: Lipid Panel ordered   LDL: 115  (01/12/2009)   LDL Direct: Not documented   HDL: 57  (01/12/2009)   Triglycerides: 169  (01/12/2009)    SGOT (AST): 36  (05/15/2009)   SGPT (ALT): 28  (05/15/2009)   Alkaline phosphatase: 97  (05/15/2009)   Total bilirubin: 0.7  (05/15/2009)    Lipid flowsheet reviewed?: Yes   Progress toward LDL goal: Unchanged  Hypertension   Last Blood Pressure: 114 / 68  (11/25/2009)   Serum creatinine: 1.11  (09/21/2009)   Serum potassium 3.9  (09/21/2009)    Hypertension flowsheet reviewed?: Yes   Progress toward BP goal: At goal  Self-Management Support :   Personal Goals (by the next clinic visit) :     Personal A1C goal: 6.5  (05/15/2009)     Personal blood pressure goal: 130/80  (05/15/2009)     Personal LDL goal: 100  (05/15/2009)    Diabetes self-management support: Written self-care plan  (05/15/2009)    Hypertension self-management support: Written self-care plan  (05/15/2009)    Lipid self-management support: Written self-care plan  (05/15/2009)    Nursing Instructions: Give Flu vaccine today Provide Hemoccult cards with instructions (see order) HgbA1C today (see order)    Laboratory Results   Blood Tests   Date/Time Received: November 25, 2009 12:02 PM  Date/Time Reported:  November 25, 2009 2:25 PM   HGBA1C: 7.8%   (Normal Range: Non-Diabetic - 3-6%   Control Diabetic - 6-8%)  Comments: ...............test performed by......Marland KitchenBonnie A. [place], MLS (ASCP)cm

## 2010-02-02 NOTE — Progress Notes (Signed)
Summary: referral  Phone Note Call from Patient Call back at [phone]   Caller: daughter-Cassandra Summary of Call: is wanting to know about her podiatry referral Initial call taken by: De Nurse,  October 20, 2009 1:39 PM  Follow-up for Phone Call        spoke with daughter and in formed her of appt. info in orders Follow-up by: Jimmy Footman, CMA,  October 20, 2009 2:25 PM

## 2010-02-02 NOTE — Consult Note (Signed)
Summary: [facility]  [facility]   Imported By: Bradly Bienenstock 11/10/2009 16:17:02  _____________________________________________________________________  External Attachment:    Type:   Image     Comment:   External Document

## 2010-02-02 NOTE — Progress Notes (Signed)
Summary: refill  Phone Note Call from Patient Call back at Home Phone [phone]   Caller: Patient Summary of Call: needs refill for strips for her Conture DM machine [facility] Initial call taken by: De Nurse,  April 28, 2009 2:04 PM  Follow-up for Phone Call        called refill in & notified pt Follow-up by: Golden Circle RN,  April 28, 2009 2:05 PM

## 2010-02-02 NOTE — Assessment & Plan Note (Signed)
Summary: f/u,df   Vital Signs:  Patient profile:   70 year old female Height:      64 inches Weight:      125 pounds BMI:     21.53 BSA:     1.60 Temp:     99.0 degrees F Pulse rate:   88 / minute BP sitting:   127 / 76  Vitals Entered By: Jone Baseman CMA (May 15, 2009 1:29 PM) CC: f/u Is Patient Diabetic? Yes Did you bring your meter with you today? No Pain Assessment Patient in pain? no        Primary Care Provider:  Doree Albee MD  CC:  f/u.  History of Present Illness: 33 YOF w/ PMHx/o HTN and DM here for chronic disease followoup: HTN: Pt denies checking BPs at home on regular basis. Pt denies HA, CP , SOB. Pt reports abdominal fullness ass'd w/ constipation. Otherwise pt asymtomatic.  DM: Pt reports checking blood sugars once every other day w/ blood sugars being in the 120s-150s per pt. No change in diet per pt. Denies vision changes, nausea, reflux,  Increased BMs- Pt reports BMs 4-5X/day productive of "a few small pebbles". Straining w/ BMs. Persistent abdominal fullness. No melanotic like stools. No BRBPR. No nausea, vomiting. Pt w/ constipation in past.   Habits & Providers  Alcohol-Tobacco-Diet     Tobacco Status: never  Allergies: No Known Drug Allergies  Physical Exam  General:  alert and well-developed.   Head:  normocephalic and atraumatic.   Eyes:  vision grossly intact, pupils equal, pupils round, and pupils reactive to light.   Ears:  R ear normal and L ear normal.   Nose:  no external deformity.   Mouth:  good dentition.   Neck:  supple and full ROM, no JVD Lungs:  normal respiratory effort, no intercostal retractions, no accessory muscle use, and normal breath sounds.   Heart:  normal rate, regular rhythm, no murmur, no gallop, and no rub.   Abdomen:  non-tender, normal bowel sounds, and no distention.   Msk:  normal ROM and no joint tenderness.   Neurologic:  alert & oriented X3, cranial nerves II-XII intact, and strength normal in  all extremities.    Diabetes Management Exam:    Foot Exam (with socks and/or shoes not present):       Sensory-Pinprick/Light touch:          Left medial foot (L-4): normal          Left dorsal foot (L-5): normal          Left lateral foot (S-1): normal          Right medial foot (L-4): normal          Right dorsal foot (L-5): normal          Right lateral foot (S-1): normal       Sensory-Monofilament:          Left foot: normal          Right foot: normal       Inspection:          Left foot: normal          Right foot: normal       Nails:          Left foot: normal          Right foot: normal   Impression & Recommendations:  Problem # 1:  HYPERTENSION (ICD-401.9) BP at goal today in setting  of intermittent HCTZ use. Pt instructed to continue HCTZ use w/ reevaluation of use at next clinical visit. Plan to also obtain labs to evaluate electrolytes and renal function.  Orders: Comp Met-[facility] [phone]) [facility]- Est Level  3 (59563)  Her updated medication list for this problem includes:    Hydrochlorothiazide 25 Mg Tabs (Hydrochlorothiazide) .Marland Kitchen... Take 1 tablet daily  Problem # 2:  DIABETES MELLITUS, TYPE II (ICD-250.00) HbA1C @ 7.1  this visit (A1C 7.0 01/2009). currently on no medication. Plan to address possible addition of metformin at next visit. However, current A1C adequate in setting of age and comorbid disease.  Diabetic foot exam WNL. Pt tentatively scheduled for diabetic eye exam in next 2-4 weeks per pt. Otherwise will start pt on aspirin for cardiovascular prophylaxis. Her updated medication list for this problem includes:    Aspir-low 81 Mg Tbec (Aspirin)  Orders: A1C-[facility] (87564) [facility]- Est Level  3 (33295)  Problem # 3:  CONSTIPATION (ICD-564.00) Pt instructed to take 1 capful of miralax daily for constipation. Pt also instructed to increase water intake. Plan to reasess in 2-4 weeks for reassessment of symptoms.   Complete Medication List: 1)   Hydrochlorothiazide 25 Mg Tabs (Hydrochlorothiazide) .... Take 1 tablet daily 2)  Protonix 40 Mg Tbec (Pantoprazole sodium) .... Take 1 tablet daily 3)  Aspir-low 81 Mg Tbec (Aspirin)  Other Orders: Dexa scan (Dexa scan) Future Orders: Pap Smear-[facility] ([id number]) ... 06/02/2009  Patient Instructions: 1)  Your blood pressure is great today!!! 2)  Try taking miralax-1 capful daily for constipation 3)  We may start you on a medication for cholesterol at our next visit. We will discuss this next week 4)  Please return next week for evaluation for possible memory loss 5)  Thank you for allowing [place] to help in your healthcare   Prevention & Chronic Care Immunizations   Influenza vaccine: Fluvax MCR  (01/12/2009)    Tetanus booster: Not documented   Td booster deferral: Not indicated  (05/15/2009)    Pneumococcal vaccine: Pneumovax  (01/12/2009)    H. zoster vaccine: Not documented  Colorectal Screening   Hemoccult: Not documented    Colonoscopy: Not documented   Colonoscopy action/deferral: Not indicated  (05/15/2009)  Other Screening   Pap smear: Not documented   Pap smear action/deferral: Deferred  (05/15/2009)    Mammogram: Not documented    DXA bone density scan: Not documented   DXA bone density action/deferral: Ordered  (05/15/2009)   Smoking status: never  (05/15/2009)  Diabetes Mellitus   HgbA1C: 7.1  (05/15/2009)    Eye exam: Not documented   Diabetic eye exam action/deferral: Ophthalmology referral  (05/15/2009)    Foot exam: yes  (05/15/2009)   Foot exam action/deferral: Do today   High risk foot: No  (05/15/2009)   Foot care education: Not documented    Urine microalbumin/creatinine ratio: Not documented  Lipids   Total Cholesterol: 206  (01/12/2009)   Lipid panel action/deferral: Lipid Panel ordered   LDL: 115  (01/12/2009)   LDL Direct: Not documented   HDL: 57  (01/12/2009)   Triglycerides: 169  (01/12/2009)    SGOT (AST): 33  (01/12/2009)    SGPT (ALT): 32  (01/12/2009) CMP ordered    Alkaline phosphatase: 99  (01/12/2009)   Total bilirubin: 0.5  (01/12/2009)    Lipid flowsheet reviewed?: Yes   Progress toward LDL goal: Unchanged  Hypertension   Last Blood Pressure: 127 / 76  (05/15/2009)   Serum creatinine: 1.05  (01/12/2009)  Serum potassium 4.2  (01/12/2009) CMP ordered     Hypertension flowsheet reviewed?: Yes   Progress toward BP goal: Improved  Self-Management Support :   Personal Goals (by the next clinic visit) :     Personal A1C goal: 6.5  (05/15/2009)     Personal blood pressure goal: 130/80  (05/15/2009)     Personal LDL goal: 100  (05/15/2009)    Diabetes self-management support: Written self-care plan  (05/15/2009)   Diabetes care plan printed    Hypertension self-management support: Written self-care plan  (05/15/2009)   Hypertension self-care plan printed.    Lipid self-management support: Written self-care plan  (05/15/2009)   Lipid self-care plan printed.   Nursing Instructions: Give Herpes zoster vaccine today Pap smear today Schedule screening DXA bone density scan (see order) Refer for screening diabetic eye exam (see order) Diabetic foot exam today    Prevention & Chronic Care Immunizations   Influenza vaccine: Fluvax MCR  (01/12/2009)    Tetanus booster: Not documented   Td booster deferral: Not indicated  (05/15/2009)    Pneumococcal vaccine: Pneumovax  (01/12/2009)    H. zoster vaccine: Not documented  Colorectal Screening   Hemoccult: Not documented    Colonoscopy: Not documented   Colonoscopy action/deferral: Not indicated  (05/15/2009)  Other Screening   Pap smear: Not documented   Pap smear action/deferral: Deferred  (05/15/2009)    Mammogram: Not documented    DXA bone density scan: Not documented   DXA bone density action/deferral: Ordered  (05/15/2009)   Smoking status: never  (05/15/2009)  Diabetes Mellitus   HgbA1C: 7.1  (05/15/2009)    Eye exam:  Not documented   Diabetic eye exam action/deferral: Ophthalmology referral  (05/15/2009)    Foot exam: yes  (05/15/2009)   Foot exam action/deferral: Do today   High risk foot: No  (05/15/2009)   Foot care education: Not documented    Urine microalbumin/creatinine ratio: Not documented  Lipids   Total Cholesterol: 206  (01/12/2009)   Lipid panel action/deferral: Lipid Panel ordered   LDL: 115  (01/12/2009)   LDL Direct: Not documented   HDL: 57  (01/12/2009)   Triglycerides: 169  (01/12/2009)    SGOT (AST): 33  (01/12/2009)   SGPT (ALT): 32  (01/12/2009) CMP ordered    Alkaline phosphatase: 99  (01/12/2009)   Total bilirubin: 0.5  (01/12/2009)    Lipid flowsheet reviewed?: Yes   Progress toward LDL goal: Unchanged  Hypertension   Last Blood Pressure: 127 / 76  (05/15/2009)   Serum creatinine: 1.05  (01/12/2009)   Serum potassium 4.2  (01/12/2009) CMP ordered     Hypertension flowsheet reviewed?: Yes   Progress toward BP goal: Improved  Self-Management Support :   Personal Goals (by the next clinic visit) :     Personal A1C goal: 6.5  (05/15/2009)     Personal blood pressure goal: 130/80  (05/15/2009)     Personal LDL goal: 100  (05/15/2009)    Diabetes self-management support: Written self-care plan  (05/15/2009)   Diabetes care plan printed    Hypertension self-management support: Written self-care plan  (05/15/2009)   Hypertension self-care plan printed.    Lipid self-management support: Written self-care plan  (05/15/2009)   Lipid self-care plan printed.   Diabetic Foot Exam Foot Inspection Is there a history of a foot ulcer?              No Is there a foot ulcer now?  No Is there swelling or an abnormal foot shape?          No Are the toenails long?                No Are the toenails thick?                No Are the toenails ingrown?              No Is there heavy callous build-up?              No Is there pain in the calf muscle  (Intermittent claudication) when walking?    NoIs there a claw toe deformity?              No Is there elevated skin temperature?            No Is there limited ankle dorsiflexion?            No Is there foot or ankle muscle weakness?            No  Diabetic Foot Care Education Pulse Check          Right Foot          Left Foot Posterior Tibial:        normal            normal Dorsalis Pedis:        normal            normal  High Risk Feet? No   10-g (5.07) Semmes-Weinstein Monofilament Test           Right Foot          Left Foot Visual Inspection               Test Control      normal         normal Site 1         normal         normal Site 2         normal         normal Site 3         normal         normal Site 4         normal         normal Site 5         normal         normal Site 6         normal         normal Site 7         normal         normal Site 8         normal         normal Site 9         normal         normal Site 10         normal         normal  Impression      normal         normal   Laboratory Results   Blood Tests   Date/Time Received: May 15, 2009 1:28 PM  Date/Time Reported: May 15, 2009 2:15 PM   HGBA1C: 7.1%   (Normal Range: Non-Diabetic - 3-6%   Control Diabetic - 6-8%)  Comments: ...............test performed by......Marland KitchenBonnie A. [place], MLS (ASCP)cm

## 2010-03-17 ENCOUNTER — Ambulatory Visit (INDEPENDENT_AMBULATORY_CARE_PROVIDER_SITE_OTHER): Payer: Medicare (Managed Care) | Admitting: Family Medicine

## 2010-03-17 ENCOUNTER — Encounter: Payer: Self-pay | Admitting: Family Medicine

## 2010-03-17 DIAGNOSIS — E119 Type 2 diabetes mellitus without complications: Secondary | ICD-10-CM

## 2010-03-17 DIAGNOSIS — F0390 Unspecified dementia without behavioral disturbance: Secondary | ICD-10-CM

## 2010-03-17 DIAGNOSIS — I10 Essential (primary) hypertension: Secondary | ICD-10-CM

## 2010-03-17 LAB — CBC
HCT: 36.8 % (ref 36.0–46.0)
Hemoglobin: 12 g/dL (ref 12.0–15.0)
MCH: 31.1 pg (ref 26.0–34.0)
RBC: 3.86 MIL/uL — ABNORMAL LOW (ref 3.87–5.11)

## 2010-03-17 LAB — BASIC METABOLIC PANEL
BUN: 17 mg/dL (ref 6–23)
CO2: 26 mEq/L (ref 19–32)
Calcium: 9.4 mg/dL (ref 8.4–10.5)
Creat: 1.06 mg/dL (ref 0.40–1.20)
Glucose, Bld: 131 mg/dL — ABNORMAL HIGH (ref 70–99)

## 2010-03-17 LAB — CONVERTED CEMR LAB
BUN: 17 mg/dL (ref 6–23)
Chloride: 103 meq/L (ref 96–112)
Creatinine, Ser: 1.06 mg/dL (ref 0.40–1.20)
Glucose, Bld: 131 mg/dL — ABNORMAL HIGH (ref 70–99)
HCT: 36.8 % (ref 36.0–46.0)
MCV: 95.3 fL (ref 78.0–100.0)
Platelets: 207 10*3/uL (ref 150–400)
Potassium: 4.3 meq/L (ref 3.5–5.3)
Sodium: 140 meq/L (ref 135–145)
WBC: 5.4 10*3/uL (ref 4.0–10.5)

## 2010-03-17 MED ORDER — HALOPERIDOL 0.5 MG PO TABS: ORAL_TABLET | ORAL | Status: DC

## 2010-03-17 NOTE — Patient Instructions (Addendum)
I am adding on norvasc to your blood pressure regimen I am checking some labs today Also increase metformin 1 tab twice daily  I am starting you on medication for sleep and agitation. I am also referring you to be evaluated in geriatric clinic; you can come in tomorrow morning for evaluation Come back to see me in 2 weeks to see how things are going God Bless, Doree Albee MD

## 2010-03-17 NOTE — Progress Notes (Signed)
  Subjective:    Patient ID: [name], female    DOB: [birth year], 70 y.o.   MRN: [id number]  HPI 68 YOF w/ PMHx/o dementia, HTN, DM here for behavior follow up: Behavior: Pt has continued with paranoid activity per daughter. Believes that someone is trying to creep into the vents. Also has been knocking on neighbor's doors asking if any one was seen breaking into house. Previous rx for [facility] was done in 11/2009. However, was not able to be completed secondary to insurance coverage. Daughter denies any aggressive behaviour. Pt also reports worsening sleep secondary to fear of someone breaking in. Daughter states that medicare would not pay for home health RN, but there were some other options available. Daughter is wanting to discuss options.   DM: Has not been checkong blood sugars daily per pt. No polyuria, polydypsia. Has been compliant with daily metformin.   HTN: Pt not checking BO at home. No HA, CP, DOE, nausea. Is currently taking once daily HCTZ.       Review of Systems See HPI    Objective:   Physical Exam Gen: up in bed, NAD HEENT: NCAT, EOMI, no scleral icterus CV: RRR, no rubs, gallops, murmurs Pulm: ctab, No wheezes, rales, rhocii EXT: 2+ peripheral pulse; n o       Assessment & Plan:  6 YOF w/ PMHx/o dementia with worsening behavior  Dementia: Currently on aricept. Will continue. WIll start on low dose haldol for agitation at night. Will consider rechecking MMSE at next visit. Overall case discussed with [name]. Will refer pt to geriatric clinic for full assessment. Also discussed medicaid options with daughter. Plan for pt to follow up in am with Geriatric Clinic.   DM: WIll check A1C. May increase metformin to 500mg  bid  HTN: Will check BMET. Will add on norvasc to BP regimen.

## 2010-03-18 ENCOUNTER — Ambulatory Visit (INDEPENDENT_AMBULATORY_CARE_PROVIDER_SITE_OTHER): Payer: Medicare (Managed Care) | Admitting: Family Medicine

## 2010-03-18 ENCOUNTER — Encounter: Payer: Self-pay | Admitting: Family Medicine

## 2010-03-18 VITALS — BP 152/79 | HR 69 | Temp 98.0°F | Ht 61.81 in | Wt 111.0 lb

## 2010-03-18 DIAGNOSIS — F329 Major depressive disorder, single episode, unspecified: Secondary | ICD-10-CM

## 2010-03-18 DIAGNOSIS — R011 Cardiac murmur, unspecified: Secondary | ICD-10-CM | POA: Insufficient documentation

## 2010-03-18 DIAGNOSIS — M79651 Pain in right thigh: Secondary | ICD-10-CM | POA: Insufficient documentation

## 2010-03-18 DIAGNOSIS — F3289 Other specified depressive episodes: Secondary | ICD-10-CM

## 2010-03-18 DIAGNOSIS — N3941 Urge incontinence: Secondary | ICD-10-CM | POA: Insufficient documentation

## 2010-03-18 DIAGNOSIS — F411 Generalized anxiety disorder: Secondary | ICD-10-CM

## 2010-03-18 DIAGNOSIS — F068 Other specified mental disorders due to known physiological condition: Secondary | ICD-10-CM

## 2010-03-18 DIAGNOSIS — M79609 Pain in unspecified limb: Secondary | ICD-10-CM

## 2010-03-18 LAB — CONVERTED CEMR LAB: Vitamin B-12: 631 pg/mL (ref 211–911)

## 2010-03-18 MED ORDER — DONEPEZIL HCL 5 MG PO TABS: 5.0000 mg | ORAL_TABLET | Freq: Every day | ORAL | Status: DC

## 2010-03-18 MED ORDER — AMLODIPINE BESYLATE 5 MG PO TABS: 5.0000 mg | ORAL_TABLET | Freq: Every day | ORAL | Status: DC

## 2010-03-18 MED ORDER — MIRTAZAPINE 15 MG PO TABS: 15.0000 mg | ORAL_TABLET | Freq: Every day | ORAL | Status: DC

## 2010-03-18 MED ORDER — METFORMIN HCL 500 MG PO TABS: 500.0000 mg | ORAL_TABLET | Freq: Every day | ORAL | Status: DC

## 2010-03-18 MED ORDER — ACETAMINOPHEN ER 650 MG PO TBCR: 650.0000 mg | EXTENDED_RELEASE_TABLET | Freq: Three times a day (TID) | ORAL | Status: AC

## 2010-03-18 NOTE — Progress Notes (Signed)
  Subjective:    Patient ID: [name], female    DOB: 03-21-1940, 70 y.o.   MRN: [id number]  HPI We are seeing [name] today for geri assessment.  She and her daughter are present and expect to get home health referral. See below for results of assessments.  R thigh pain:  She last R thigh pain for a month or so now.  Worse with standing straight, better with sitting down.  Unable to describe quality of pain.  Not taking anything for the pain.  Pain radiates down lateral thigh.  No bowel changes.  Incontinence:  She has episodes of random urges, rushes to the toilet but doesn't always make it.  I spent > 45 mins with this pt, >50% was face to face counselling.  Review of Systems    See HPI Objective:   Physical Exam  Constitutional:       Thin but well developed AAF. NAD  HENT:  Head: Normocephalic and atraumatic.  Neck: Neck supple. No tracheal deviation present. No thyromegaly present.  Cardiovascular: Normal rate and regular rhythm.  Exam reveals no gallop and no friction rub.   Murmur heard.      3/6 SEM at R 2ICS.  Pulmonary/Chest: Effort normal and breath sounds normal. No respiratory distress. She has no wheezes. She has no rales. She exhibits no tenderness.  Musculoskeletal:       Bilateral Shoulders: Inspection reveals no abnormalities, atrophy or asymmetry. Palpation is normal with no tenderness over AC joint or bicipital groove. ROM is full in all planes. Rotator cuff strength normal throughout. No signs of impingement with negative Neer and Hawkin's tests, empty can sign. Speeds and Yergason's tests normal. No labral pathology noted with negative Obrien's, negative clunk and good stability. Normal scapular function observed. No painful arc and no drop arm sign. No apprehension sign  Bilateral Hips: ROM IR: 45 Deg, ER: 45 Deg, Flexion: 120 Deg, Extension: 100 Deg, Abduction: 45 Deg, Adduction: 45 Deg Strength IR: 5/5, ER: 5/5, Flexion: 5/5, Extension:  5/5, Abduction: 5/5, Adduction: 5/5 Pelvic alignment unremarkable to inspection and palpation. Standing hip rotation and gait without trendelenburg sign / unsteadiness. Greater trochanter without tenderness to palpation. No tenderness over piriformis and greater trochanter. No pain with FABER or FADIR. No SI joint tenderness and normal minimal SI movement.   Lymphadenopathy:    She has no cervical adenopathy.      Geri depression scale:  10 abnormals (>5 is positive) MMSE:  17/30   Assessment & Plan:

## 2010-03-18 NOTE — Assessment & Plan Note (Signed)
Severe. Not currently treated. Will start mirtazapine 15mg  qHS in the hopes of tx depression and improving appetite.

## 2010-03-18 NOTE — Assessment & Plan Note (Signed)
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 No signs CES. Will start acetaminophen 650.

## 2010-03-18 NOTE — Assessment & Plan Note (Signed)
To PCP. May consider newer bladder antispasmodics such as Vesicare with less central action. Should also try Kegel exercises.

## 2010-03-18 NOTE — Assessment & Plan Note (Addendum)
Moderate with MMSE 17. Agree with aricept, may increase dose in a month. Namenda has better evidence for mod/severe depression, can add this if no improvement with aricept up-titration. Will treat concurrent depression. CBC, CMET, TSH are ok. Completing workup with B12, RPR.

## 2010-03-18 NOTE — Assessment & Plan Note (Signed)
I suspect AS however without pulsus parvus et tardus, narrow pulse pressure, CHF, CP, or other symptoms unlikely tight stenosis. Should remain vigilant for onset of symptoms.

## 2010-03-18 NOTE — Assessment & Plan Note (Signed)
Stop Buspar as pt not using this.

## 2010-03-18 NOTE — Patient Instructions (Signed)
Great to meet you. Start taking the new medication as below. Checking some bloodwork. Come back to see [place] in a month, keep your other appts with Dr. Alvester Morin.  -Dr. Karie Schwalbe.

## 2010-03-19 LAB — PREALBUMIN: Prealbumin: 23.2 mg/dL (ref 17.0–34.0)

## 2010-03-31 ENCOUNTER — Ambulatory Visit (INDEPENDENT_AMBULATORY_CARE_PROVIDER_SITE_OTHER): Payer: Medicare (Managed Care) | Admitting: Family Medicine

## 2010-03-31 DIAGNOSIS — M79609 Pain in unspecified limb: Secondary | ICD-10-CM

## 2010-03-31 DIAGNOSIS — F0280 Dementia in other diseases classified elsewhere without behavioral disturbance: Secondary | ICD-10-CM

## 2010-03-31 DIAGNOSIS — F0281 Dementia in other diseases classified elsewhere with behavioral disturbance: Secondary | ICD-10-CM

## 2010-03-31 DIAGNOSIS — M79651 Pain in right thigh: Secondary | ICD-10-CM

## 2010-03-31 NOTE — Patient Instructions (Signed)
Dementia Dementia is a general term for problems with brain function. A person with dementia has memory loss and a hard time with at least one other brain function such as thinking, speaking, language skills or solving problems. It can affect social functioning, how you do your job, mood or personality. The changes may be hidden for a long time. The earliest forms of this disease are usually not detected by family or friends. CAUSES Dementia can be caused by:  Many small strokes (vascular dementia).   Medications.   Masses or pressure in the brain (tumor, blood clot, normal pressure hydrocephalus).   Infection (chronic meningitis or Creutzfelt-Jakob disease).   Depression. This can contribute to the symptoms of dementia.   Degeneration of brain cells (Alzheimer's disease or Lewy Body Disease).   Metabolic causes (excessive alcohol intake, vitamin B12 deficiency or thyroid disease).  SYMPTOMS Symptoms are often hard to detect. Families or co-workers may not notice them early in the disease process. Different people with dementia may have different symptoms.  Symptoms can include:  A hard time with memory, especially recent memory.   You may ask the same question multiple times or forget something someone just told you.   Long-term memory may not be impaired.   A hard time speaking your thoughts or finding certain words.   A hard time solving problems or performing familiar tasks (such as how to use a telephone).   Sudden changes in mood.   Changes in personality, especially increasing moodiness or mistrust.   A hard time understanding complex ideas that were never a problem in the past.  DIAGNOSIS There are no specific tests for dementia.   Your caregiver may recommend a thorough evaluation. This is because some forms of dementia can be reversible. The evaluation will likely include a physical exam and getting a detailed history from you and a family member. The history often  gives the best clues and suggestions for a diagnosis.   Memory testing may be done. A detailed brain function evaluation called neuropsychologic testing may be helpful.   Lab tests and brain imaging (such as CT scan or MRI scan) are sometimes important.   Sometimes observation and reevaluation over time is very helpful.  TREATMENT Treatment depends on the cause.   If the problem is a vitamin deficiency, it may be helped or cured with that supplement.   For dementias such as Alzheimer's disease (degenerative dementia), medicines are available to stabilize or slow the course of the disease. There are no cures for this type.   Your caregiver can help direct you to groups, organizations and other caregivers to help with decisions in the care of you or your loved one.   Dementia research is very active. Several medications are being looked at and tested in clinical trials. There is always a measure of hope that help will be coming.  SEEK MEDICAL CARE IF:  New behavioral problems start such as moodiness, aggressiveness or seeing things that are not there (hallucinations) develop.   Any new problem with brain function happens. This includes problems with balance, speech or falling a lot.   Problems with swallowing develop.   Any symptoms of other illness happen.  Small changes or worsening in any aspect of brain function can be a sign that the illness is getting worse. It can also be a sign of another medical illness such as infection. Seeing a caregiver right away is important. SEEK IMMEDIATE MEDICAL CARE IF:  A temperature over 100.5 develops   or as directed by your caregiver.   New or worsened confusion develops.   New or worsened sleepiness or having a hard time staying awake develops.  Document Released: 06/15/2000 Document Re-Released: 06/08/2007 [employer] Patient Information 2011 [employer]. 

## 2010-04-01 NOTE — Progress Notes (Signed)
  Subjective:    Patient ID: [name], female    DOB: November 03, 1940, 70 y.o.   MRN: [id number]  HPI Pt here for follow up on Dementia: Pt recently seen in geriatric clinic. Currently on aricept, haldol and remeron. Haldol and remeron recently started over the last 2 weeks. Mood overall stable per daughter. Daughter states that pt has began mixing pills in pill dispenser. Daughter has been managing pills for several months and noticed pills in wrong place this weekend. Pt in process of applying for medicaid.   Knee Pain: Stable with tylenol. Also using cane (given by friend to keep) which has helped with ambulation   Review of Systems See HPI     Objective:   Physical Exam Gen: up in chair, NAD HEENT: NCAT, EOMI, TMs clear bilaterally CV: RRR, no murmurs auscultated PULM: CTAB, no wheezes, rales, rhoncii ABD: S/NT/+ bowel sounds  EXT: 2+ peripheral pulses MSK: R knee,  Full ROM, neg mcmurray's       Assessment & Plan:  Dementia- Overall stable mood, but behavior is concerning. Stressed importance of medicaid completion. Also broached issue of possible SNF placement if medication behavior continues. Daughter agreeable to managing medications until followup. Will follow up in 2 weeks.   Knee Pain- Stable. Continue cane. Continue prn tylenol.

## 2010-04-01 NOTE — Assessment & Plan Note (Signed)
Overall stable mood, but behavior is concerning. Stressed importance of medicaid completion. Also broached issue of possible SNF placement if medication behavior continues. Daughter agreeable to managing medications until followup. Will follow up in 2 weeks.

## 2010-04-01 NOTE — Assessment & Plan Note (Signed)
Stable. Continue cane. Continue prn tylenol.

## 2010-04-22 ENCOUNTER — Encounter: Payer: Self-pay | Admitting: Family Medicine

## 2010-04-22 ENCOUNTER — Ambulatory Visit (INDEPENDENT_AMBULATORY_CARE_PROVIDER_SITE_OTHER): Payer: Medicare (Managed Care) | Admitting: Family Medicine

## 2010-04-22 VITALS — BP 130/68 | HR 72 | Temp 97.8°F | Ht 61.5 in | Wt 118.3 lb

## 2010-04-22 DIAGNOSIS — G309 Alzheimer's disease, unspecified: Secondary | ICD-10-CM

## 2010-04-22 DIAGNOSIS — M79609 Pain in unspecified limb: Secondary | ICD-10-CM

## 2010-04-22 DIAGNOSIS — M79651 Pain in right thigh: Secondary | ICD-10-CM

## 2010-04-22 DIAGNOSIS — R011 Cardiac murmur, unspecified: Secondary | ICD-10-CM

## 2010-04-22 DIAGNOSIS — F068 Other specified mental disorders due to known physiological condition: Secondary | ICD-10-CM

## 2010-04-22 DIAGNOSIS — F0281 Dementia in other diseases classified elsewhere with behavioral disturbance: Secondary | ICD-10-CM

## 2010-04-22 MED ORDER — DONEPEZIL HCL 10 MG PO TABS: 10.0000 mg | ORAL_TABLET | Freq: Every day | ORAL | Status: DC

## 2010-04-22 NOTE — Assessment & Plan Note (Signed)
Pt has mid-systolic murmur likely AS, no recent ECHO.  Will let PCP follow this, if pt has symptoms of chest pain or lightheadedness will do ECHO.

## 2010-04-22 NOTE — Progress Notes (Signed)
Pt has gained 3 lbs since she was here last.  She does not have any balanance problems per pateitn.  She walks for exercise.  Her daughter feels she is not taking her pills correctly.  Pt and daughter has looked into getting her into an adult daytime care facility.  No feelings of lightheadedness no pain in chest. Pt has known mid-systolic murmur.   Dementia: Pt has delusions of a man that comes into her house and eats her food and also goes into the duct system and stares at her. She has had the locks changed in her house and uses a door stop as well but still says that he is getting into her house. She does have some distress when thinking about it and says it wakes her up at night but he does not talk to her or touch her. Pt is forgetting to take her pills at night and the daughter is concerned about that. She has spoken with a resource person in the area and does not qualify for personal care services but they are looking into an adult daycare.   ROS: neg except as noted in HPI.   PE:  Gen: NAD, sitting comfortably on table.  Heent: [place]/AT, no external abnormalities. CV: Pt has mid systolic murmur heard loudest in 4th LICS, mid sternum and 2nd RICS. Suspect AS, no murmur into carotids.  Pulm: CTAB, no wheezes or crackles.  Ext: no edema.

## 2010-04-22 NOTE — Assessment & Plan Note (Signed)
Pt is doing all ADLs at this time. She is going to be moving in with her daughter June 1st. She sometimes forgets to take her evening medicine.  Increased Aricept today to 10 mg qHS

## 2010-04-22 NOTE — Patient Instructions (Signed)
Come back in 1 month to see Dr. Alvester Morin. Make the appointment today on your way out.  We increased your Aricept to 10 mg so your next prescription will be a higher dose.

## 2010-04-22 NOTE — Assessment & Plan Note (Signed)
Pt borrowed her friends cane. She is no longer needing the cane and never used the Tylenol.

## 2010-05-10 ENCOUNTER — Encounter: Payer: Self-pay | Admitting: Home Health Services

## 2010-05-19 ENCOUNTER — Ambulatory Visit: Payer: Medicare (Managed Care) | Admitting: Family Medicine

## 2010-05-25 ENCOUNTER — Other Ambulatory Visit: Payer: Self-pay | Admitting: Family Medicine

## 2010-05-26 NOTE — Telephone Encounter (Signed)
Refill request

## 2010-06-07 ENCOUNTER — Encounter: Payer: Self-pay | Admitting: Family Medicine

## 2010-06-07 ENCOUNTER — Ambulatory Visit (INDEPENDENT_AMBULATORY_CARE_PROVIDER_SITE_OTHER): Payer: Medicare (Managed Care) | Admitting: Family Medicine

## 2010-06-07 VITALS — BP 118/72 | HR 72 | Wt 116.7 lb

## 2010-06-07 DIAGNOSIS — F0280 Dementia in other diseases classified elsewhere without behavioral disturbance: Secondary | ICD-10-CM

## 2010-06-07 DIAGNOSIS — K069 Disorder of gingiva and edentulous alveolar ridge, unspecified: Secondary | ICD-10-CM

## 2010-06-07 DIAGNOSIS — K056 Periodontal disease, unspecified: Secondary | ICD-10-CM

## 2010-06-07 DIAGNOSIS — K068 Other specified disorders of gingiva and edentulous alveolar ridge: Secondary | ICD-10-CM | POA: Insufficient documentation

## 2010-06-07 DIAGNOSIS — F0281 Dementia in other diseases classified elsewhere with behavioral disturbance: Secondary | ICD-10-CM

## 2010-06-07 DIAGNOSIS — E119 Type 2 diabetes mellitus without complications: Secondary | ICD-10-CM

## 2010-06-07 DIAGNOSIS — R011 Cardiac murmur, unspecified: Secondary | ICD-10-CM

## 2010-06-07 LAB — POCT GLYCOSYLATED HEMOGLOBIN (HGB A1C): Hemoglobin A1C: 7.3

## 2010-06-07 MED ORDER — METFORMIN HCL 500 MG PO TABS: ORAL_TABLET | ORAL | Status: DC

## 2010-06-07 NOTE — Assessment & Plan Note (Signed)
No CP, SOB, increased WOB during 06/07/10 visit. Will continue to clinically follow. If pt becomes symptomatic, will consider ECHO as previously documented.

## 2010-06-07 NOTE — Progress Notes (Signed)
  Subjective:    Patient ID: [name], female    DOB: 21-Dec-1940, 70 y.o.   MRN: [id number]  HPI Pt here for chronic problem followup. Pt recently moved in with daughter out of safety concerns. Has lived with daiughter for approx 1 week.  5 dollars. Paranoia has improved somewhat. Is currently taking aricept 10mg  nightly. Tolerating this well per daughter.  Gum Pain- x 1 week. No fevers. Swallowing at baseline per pt. Pt sleeps with dentures in nightly per pt.  DM-  Has been checking blood sugars at home. Did not bring log today. + Polyuria, polydypsia. HbA1C 7.3 today. + subjective increase in sugar intake. Currently metformin 500 1 tablet daily.    Review of Systems See HPI     Objective:   Physical Exam Gen: In bed, NAD,  HEENT: NCAT, EOMI, no oral lesions with dentures removed CV: RRR, no murmur auscultated on exam.  PULM: CTAB, no wheezes, rales, rhocii ABD: S/NT/+ bowel sounds  ECT: 2+ peripheral pulses, no edema     Assessment & Plan:  Gum Pain: Instructed pt that she needs to take dentures out nightly. Will refer pt refer to affordable dent[facility] for possible new denture fitting. No current signs of infection or trauma. Behavior: Somewhat improved since moving in with daughter. Still with intermittent episodes of paranoia. On haldol and remeron nightly. Also on aricept 10 daily. Will continue to follow clinically. Pt may need uptitration of haldol if paranoia persists.   DM: A1C 7.3 today. Will increase metformin to 1g in am and 500mg  in pm given polyuria and polydypsia. Noted increase in carbohydrate intake.  Broached issue of decreasing carb load with pt and daughter. Pt willing to consider.

## 2010-06-07 NOTE — Assessment & Plan Note (Signed)
Somewhat improved since moving in with daughter. Still with intermittent episodes of paranoia. On haldol and remeron nightly. Also on aricept 10 daily. Will continue to follow clinically. Pt may need uptitration of haldol if paranoia persists.

## 2010-06-07 NOTE — Assessment & Plan Note (Signed)
Instructed pt that she needs to take dentures out nightly. Will refer pt refer to [employer] [facility] for possible new denture fitting. No current signs of infection or trauma.

## 2010-06-07 NOTE — Patient Instructions (Signed)
It was good to see you today I am going to increase your metformin to help blood sugars; Try limit the amount of sugar your eat as much as possible, I am referring you to affordable dent[facility] for your denture pain  Try taking your dentures out at night. This may help with your gum pain.  Come back to see me on June 18th to discuss more about functionality  Otherwise, call with any other additional questions.  God Bless, Doree Albee MD

## 2010-06-07 NOTE — Assessment & Plan Note (Signed)
A1C 7.3 today. Will increase metformin to 1g in am and 500mg  in pm given polyuria and polydypsia. Noted increase in carbohydrate intake.  Broached issue of decreasing carb load with pt and daughter. Pt willing to consider.

## 2010-06-21 ENCOUNTER — Ambulatory Visit (INDEPENDENT_AMBULATORY_CARE_PROVIDER_SITE_OTHER): Payer: Medicare (Managed Care) | Admitting: Family Medicine

## 2010-06-21 ENCOUNTER — Encounter: Payer: Self-pay | Admitting: Family Medicine

## 2010-06-21 VITALS — BP 121/72 | HR 78 | Wt 115.0 lb

## 2010-06-21 DIAGNOSIS — F0280 Dementia in other diseases classified elsewhere without behavioral disturbance: Secondary | ICD-10-CM

## 2010-06-21 DIAGNOSIS — G309 Alzheimer's disease, unspecified: Secondary | ICD-10-CM

## 2010-06-21 DIAGNOSIS — F02818 Dementia in other diseases classified elsewhere, unspecified severity, with other behavioral disturbance: Secondary | ICD-10-CM

## 2010-06-21 DIAGNOSIS — F0281 Dementia in other diseases classified elsewhere with behavioral disturbance: Secondary | ICD-10-CM

## 2010-06-21 NOTE — Patient Instructions (Signed)
It was good to see you again Ms. Nordell seems otherwise stable, Let me know if I can be of any additional help in getting [name] additional resources. Call with any other questions, Come back to see me in 3 months God Bless,  Doree Albee MD

## 2010-06-25 ENCOUNTER — Encounter: Payer: Self-pay | Admitting: Family Medicine

## 2010-06-25 NOTE — Progress Notes (Signed)
  Subjective:    Patient ID: [name], female    DOB: August 04, 1940, 70 y.o.   MRN: [id number]  HPI Pt here for adult day care and SCAT form fill out.  No acute issues or since last clinical visit. Has adjusted well to living at home with daughter. Overall mood has been stable per daughter. Paranoia has also improved. Agititation improved with haldol.    Review of Systems See HPI    Objective:   Physical Exam Gen: Up in chair, NAD CV: RRR, no rubs, gallops, murmurs PULM: CTAB, no wheezes ABD: S/NT/+bowel sounds EXT: 2 + peripheral pulses    Assessment & Plan:  Dementia: Overall stable. Will continue current regimen. SCAT and city bus forms filled out. Will otherwise follow up in 3-6 months. Pt/daughter agreeable to plan.

## 2010-06-25 NOTE — Assessment & Plan Note (Signed)
Overall stable. Will continue current regimen. SCAT and city bus forms filled out. Will otherwise follow up in 3-6 months. Pt/daughter agreeable to plan.

## 2010-07-11 ENCOUNTER — Other Ambulatory Visit: Payer: Self-pay | Admitting: Family Medicine

## 2010-07-11 MED ORDER — ALENDRONATE SODIUM 70 MG PO TABS: 70.0000 mg | ORAL_TABLET | ORAL | Status: DC

## 2010-07-16 ENCOUNTER — Other Ambulatory Visit: Payer: Self-pay | Admitting: Family Medicine

## 2010-07-16 MED ORDER — HALOPERIDOL 0.5 MG PO TABS: 0.5000 mg | ORAL_TABLET | Freq: Every day | ORAL | Status: DC

## 2010-07-16 MED ORDER — ALENDRONATE SODIUM 70 MG PO TABS: 70.0000 mg | ORAL_TABLET | ORAL | Status: DC

## 2010-07-18 ENCOUNTER — Other Ambulatory Visit: Payer: Self-pay | Admitting: Family Medicine

## 2010-07-18 DIAGNOSIS — F329 Major depressive disorder, single episode, unspecified: Secondary | ICD-10-CM

## 2010-07-18 MED ORDER — MIRTAZAPINE 15 MG PO TABS: 15.0000 mg | ORAL_TABLET | Freq: Every day | ORAL | Status: DC

## 2010-08-13 ENCOUNTER — Other Ambulatory Visit: Payer: Self-pay | Admitting: Family Medicine

## 2010-08-13 MED ORDER — ALENDRONATE SODIUM 70 MG PO TABS: 70.0000 mg | ORAL_TABLET | ORAL | Status: DC

## 2010-08-25 ENCOUNTER — Other Ambulatory Visit: Payer: Self-pay | Admitting: Family Medicine

## 2010-08-25 MED ORDER — DONEPEZIL HCL 5 MG PO TABS: 10.0000 mg | ORAL_TABLET | Freq: Every day | ORAL | Status: DC

## 2010-10-27 ENCOUNTER — Other Ambulatory Visit: Payer: Self-pay | Admitting: Family Medicine

## 2010-10-27 MED ORDER — HYDROCHLOROTHIAZIDE 25 MG PO TABS: 25.0000 mg | ORAL_TABLET | Freq: Every day | ORAL | Status: DC

## 2010-10-29 ENCOUNTER — Other Ambulatory Visit: Payer: Self-pay | Admitting: Family Medicine

## 2010-10-29 DIAGNOSIS — Z1231 Encounter for screening mammogram for malignant neoplasm of breast: Secondary | ICD-10-CM

## 2010-11-10 ENCOUNTER — Other Ambulatory Visit: Payer: Self-pay | Admitting: Family Medicine

## 2010-11-11 NOTE — Telephone Encounter (Signed)
Refill request

## 2010-11-16 ENCOUNTER — Ambulatory Visit (INDEPENDENT_AMBULATORY_CARE_PROVIDER_SITE_OTHER): Payer: No Typology Code available for payment source | Admitting: Family Medicine

## 2010-11-16 ENCOUNTER — Ambulatory Visit
Admission: RE | Admit: 2010-11-16 | Discharge: 2010-11-16 | Disposition: A | Discharge status: Home / Self Care | Payer: No Typology Code available for payment source | Source: Ambulatory Visit | Attending: Family Medicine | Admitting: Family Medicine

## 2010-11-16 VITALS — BP 151/81 | HR 85 | Ht 64.0 in | Wt 125.0 lb

## 2010-11-16 DIAGNOSIS — R350 Frequency of micturition: Secondary | ICD-10-CM

## 2010-11-16 DIAGNOSIS — H9209 Otalgia, unspecified ear: Secondary | ICD-10-CM

## 2010-11-16 DIAGNOSIS — F0280 Dementia in other diseases classified elsewhere without behavioral disturbance: Secondary | ICD-10-CM

## 2010-11-16 DIAGNOSIS — F0281 Dementia in other diseases classified elsewhere with behavioral disturbance: Secondary | ICD-10-CM

## 2010-11-16 DIAGNOSIS — M810 Age-related osteoporosis without current pathological fracture: Secondary | ICD-10-CM

## 2010-11-16 DIAGNOSIS — E119 Type 2 diabetes mellitus without complications: Secondary | ICD-10-CM

## 2010-11-16 DIAGNOSIS — Z23 Encounter for immunization: Secondary | ICD-10-CM

## 2010-11-16 DIAGNOSIS — I10 Essential (primary) hypertension: Secondary | ICD-10-CM

## 2010-11-16 DIAGNOSIS — Z1231 Encounter for screening mammogram for malignant neoplasm of breast: Secondary | ICD-10-CM

## 2010-11-16 DIAGNOSIS — H9201 Otalgia, right ear: Secondary | ICD-10-CM

## 2010-11-16 LAB — POCT URINALYSIS DIPSTICK
Glucose, UA: NEGATIVE
Leukocytes, UA: NEGATIVE
Protein, UA: NEGATIVE
Urobilinogen, UA: 0.2

## 2010-11-16 LAB — BASIC METABOLIC PANEL
BUN: 20 mg/dL (ref 6–23)
Chloride: 102 mEq/L (ref 96–112)
Glucose, Bld: 156 mg/dL — ABNORMAL HIGH (ref 70–99)
Potassium: 4.4 mEq/L (ref 3.5–5.3)
Sodium: 140 mEq/L (ref 135–145)

## 2010-11-16 LAB — POCT GLYCOSYLATED HEMOGLOBIN (HGB A1C): Hemoglobin A1C: 6.3

## 2010-11-16 MED ORDER — METFORMIN HCL 1000 MG PO TABS: ORAL_TABLET | ORAL | Status: DC

## 2010-11-16 MED ORDER — BLOOD GLUCOSE METER KIT: PACK | Status: DC

## 2010-11-16 MED ORDER — ALENDRONATE SODIUM 70 MG PO TABS: ORAL_TABLET | ORAL | Status: DC

## 2010-11-16 NOTE — Patient Instructions (Addendum)
It was good to see you again I think Ms. Spada's  increased urinary frequency is likely from her Aricept If she develops any fever, nausea, vomiting, or abdominal pain please call us to let us know Come back for followup visit in 6 months Call if any questions God Bless,  Doree Albee MD

## 2010-11-20 ENCOUNTER — Encounter: Payer: Self-pay | Admitting: Family Medicine

## 2010-11-20 DIAGNOSIS — R350 Frequency of micturition: Secondary | ICD-10-CM | POA: Insufficient documentation

## 2010-11-20 DIAGNOSIS — H9201 Otalgia, right ear: Secondary | ICD-10-CM | POA: Insufficient documentation

## 2010-11-20 NOTE — Progress Notes (Signed)
  Subjective:    Patient ID: [name], female    DOB: 05/25/1940, 70 y.o.   MRN: [id number]  HPI Patient is here for general medical follow up:  Right ear pain: Daughter reports patient with right ear pain x1-2 months. This is somewhat of a chronic/90 issue. Patient is been intermittently complaining of right ear pain. No noted clear discharge. No secondary fevers. No nasal congestion or rhinorrhea. No URI symptoms. Daughter also reports patient with subjective decrease in hearing.  Increased urinary frequency:. Daughter also reports patient with increased urinary frequency over the last 2-3 months. No dysuria type symptoms. No pelvic/abdominal pain. No fever. Blood sugars are been overall well controlled. No polyphasia.  Dementia: Seemingly stable on aricept 10. Pt has daughter and granddaughter who are helping manage pt at home in addition to adult daycare during the week. No behavioral disturbances or hypervigilance displayed by the pt.    Review of Systems See HPI     Objective:   Physical Exam Gen: up in chair, NAD HEENT: NCAT, EOMI, Marked cerumen impaction bilaterally  CV: RRR, no murmurs auscultated PULM: CTAB, no wheezes, rales, rhoncii ABD: S/NT/+ bowel sounds  EXT: 2+ peripheral pulses     Assessment & Plan:

## 2010-11-20 NOTE — Assessment & Plan Note (Signed)
A1C 6.3 today. Stable on metformin. Will continue to follow.

## 2010-11-20 NOTE — Assessment & Plan Note (Signed)
Will continue on current regimen in setting of increased urinary frequency.

## 2010-11-20 NOTE — Assessment & Plan Note (Signed)
Likely secondary to Cerumen impaction. TMs WNL s/p cerumen removal. Discussed red flags for return. Will continue to follow.

## 2010-11-20 NOTE — Assessment & Plan Note (Signed)
Likely secondary to pro-cholinergic effect of aricept. UA negative. Discussed overall risks and benefits of medication in light of increased urination. Daughter would like to continue aricept at current dose. Will continue to follow. Discussed red flags for return.

## 2010-11-21 ENCOUNTER — Encounter: Payer: Self-pay | Admitting: Family Medicine

## 2010-12-30 ENCOUNTER — Encounter: Payer: Self-pay | Admitting: Family Medicine

## 2010-12-30 ENCOUNTER — Ambulatory Visit (INDEPENDENT_AMBULATORY_CARE_PROVIDER_SITE_OTHER): Payer: No Typology Code available for payment source | Admitting: Family Medicine

## 2010-12-30 VITALS — BP 150/80 | HR 88 | Temp 98.1°F | Ht 64.0 in | Wt 125.0 lb

## 2010-12-30 DIAGNOSIS — R05 Cough: Secondary | ICD-10-CM

## 2010-12-30 DIAGNOSIS — R059 Cough, unspecified: Secondary | ICD-10-CM

## 2010-12-30 MED ORDER — GUAIFENESIN-CODEINE 100-10 MG/5ML PO SYRP: 5.0000 mL | ORAL_SOLUTION | Freq: Three times a day (TID) | ORAL | Status: AC | PRN

## 2010-12-30 NOTE — Patient Instructions (Signed)
Thank you for coming in today. It is normal to have a cough for up to 6 weeks after a cold.  Use over the counter cough medicine as needed.  If that is not working you can use a small amount of the prescription cough medicine at night.  It may cause drowsiness constipation and confusion so use sparingly.

## 2010-12-30 NOTE — Assessment & Plan Note (Signed)
Cough likely postviral in nature. Plan to follow conservatively have provided codeine-based cough medicine for use with extreme cough. Discussed at length that this medication is risky in a patient with Alzheimer's dementia. Will followup if no improvement or sooner if worsening.

## 2010-12-30 NOTE — Progress Notes (Signed)
Ms. Linquist presents to clinic today to followup a cough for 2-3 weeks. The cough keeps her up at night but is not causing other problems. She denies any dyspnea vomiting abdominal pain or fever or chest pain. She has tried some over-the-counter cough medicine that works a little bit. She feels well overall. She thinks that perhaps she had a cold that preceded this cough.  PMH reviewed.  ROS as above otherwise neg Medications reviewed. Current Outpatient Prescriptions  Medication Sig Dispense Refill  . alendronate (FOSAMAX) 70 MG tablet 1 tab weekly  5 tablet  6  . amLODipine (NORVASC) 5 MG tablet Take 1 tablet (5 mg total) by mouth daily.  60 tablet  11  . aspirin (ASPIR-LOW) 81 MG EC tablet       . BAYER CONTOUR TEST test strip       . Blood Glucose Monitoring Suppl (BLOOD GLUCOSE METER) kit Use as instructed  1 each  0  . calcium citrate-vitamin D (CITRACAL+D) 315-200 MG-UNIT per tablet Take 2 tablets by mouth 2 (two) times daily.        Marland Kitchen donepezil (ARICEPT) 10 MG tablet Take 1 tablet (10 mg total) by mouth at bedtime.  30 tablet  2  . donepezil (ARICEPT) 5 MG tablet Take 2 tablets (10 mg total) by mouth at bedtime.  60 tablet  6  . guaiFENesin-codeine (ROBITUSSIN AC) 100-10 MG/5ML syrup Take 5 mLs by mouth 3 (three) times daily as needed for cough.  120 mL  0  . haloperidol (HALDOL) 0.5 MG tablet Take 1 tablet (0.5 mg total) by mouth at bedtime.  30 tablet  6  . hydrochlorothiazide (HYDRODIURIL) 25 MG tablet Take 1 tablet (25 mg total) by mouth daily.  30 tablet  11  . mirtazapine (REMERON) 15 MG tablet TAKE ONE TABLET BY MOUTH EVERY DAY AT BEDTIME  30 tablet  6  . omeprazole (PRILOSEC) 40 MG capsule Take 40 mg by mouth daily.          Exam:  BP 150/80  Pulse 88  Temp(Src) 98.1 F (36.7 C) (Oral)  Ht 5\' 4"  (1.626 m)  Wt 125 lb (56.7 kg)  BMI 21.46 kg/m2 Gen: Well NAD Lungs: CTABL Nl WOB Heart: RRR no systolic murmur present at the upper lower sternal borders Abd: NABS, NT,  ND Exts: Non edematous BL  LE, warm and well perfused.

## 2011-04-04 ENCOUNTER — Telehealth: Payer: Self-pay | Admitting: Family Medicine

## 2011-04-04 ENCOUNTER — Ambulatory Visit (INDEPENDENT_AMBULATORY_CARE_PROVIDER_SITE_OTHER): Payer: No Typology Code available for payment source | Admitting: Family Medicine

## 2011-04-04 ENCOUNTER — Encounter: Payer: Self-pay | Admitting: Family Medicine

## 2011-04-04 VITALS — BP 112/66 | Temp 98.1°F | Ht 64.0 in | Wt 123.1 lb

## 2011-04-04 DIAGNOSIS — F02818 Dementia in other diseases classified elsewhere, unspecified severity, with other behavioral disturbance: Secondary | ICD-10-CM

## 2011-04-04 DIAGNOSIS — F411 Generalized anxiety disorder: Secondary | ICD-10-CM

## 2011-04-04 DIAGNOSIS — R350 Frequency of micturition: Secondary | ICD-10-CM

## 2011-04-04 DIAGNOSIS — I10 Essential (primary) hypertension: Secondary | ICD-10-CM

## 2011-04-04 DIAGNOSIS — E119 Type 2 diabetes mellitus without complications: Secondary | ICD-10-CM

## 2011-04-04 DIAGNOSIS — F068 Other specified mental disorders due to known physiological condition: Secondary | ICD-10-CM

## 2011-04-04 DIAGNOSIS — F0281 Dementia in other diseases classified elsewhere with behavioral disturbance: Secondary | ICD-10-CM

## 2011-04-04 DIAGNOSIS — R300 Dysuria: Secondary | ICD-10-CM

## 2011-04-04 DIAGNOSIS — G309 Alzheimer's disease, unspecified: Secondary | ICD-10-CM

## 2011-04-04 LAB — POCT URINALYSIS DIPSTICK
Bilirubin, UA: NEGATIVE
Ketones, UA: NEGATIVE
Protein, UA: NEGATIVE
Spec Grav, UA: 1.01

## 2011-04-04 LAB — POCT GLYCOSYLATED HEMOGLOBIN (HGB A1C): Hemoglobin A1C: 13.6

## 2011-04-04 LAB — BASIC METABOLIC PANEL
CO2: 24 mEq/L (ref 19–32)
Calcium: 9.5 mg/dL (ref 8.4–10.5)
Sodium: 132 mEq/L — ABNORMAL LOW (ref 135–145)

## 2011-04-04 MED ORDER — METFORMIN HCL 1000 MG PO TABS: ORAL_TABLET | ORAL | Status: DC

## 2011-04-04 MED ORDER — HALOPERIDOL 0.5 MG PO TABS: ORAL_TABLET | ORAL | Status: DC

## 2011-04-04 MED ORDER — DONEPEZIL HCL 5 MG PO TABS: 5.0000 mg | ORAL_TABLET | Freq: Every day | ORAL | Status: DC

## 2011-04-04 MED ORDER — BLOOD GLUCOSE METER KIT: PACK | Status: DC

## 2011-04-04 MED ORDER — MEMANTINE HCL 5 MG PO TABS: ORAL_TABLET | ORAL | Status: DC

## 2011-04-04 NOTE — Assessment & Plan Note (Signed)
UA with >1000 glucose. I suspect this as likely cause of increased urinary frequency. A1C today. Will likely need addition of long acting insulin to metformin  if A1C reflective of UA. Check Cr, K, and renal function in light of metformin use.

## 2011-04-04 NOTE — Progress Notes (Signed)
S:  Patient presents today for chronic problem followup  Urinary frequency: Patient does report having  increased urinary frequency over the last 3-4 weeks. Patient is known to have prior history of urinary frequency with initial starting of Aricept. Family was agreeable to continue the medication and full dose because of cognition benefits. Per family urination has been relatively stable the last 5 months however patient has had increase urinary for frequency last month. Patient and daughter deny any dysuria, abdominal pain, fever, nausea, flank pain. No hematuria. From a diabetic standpoint patient has not been having her blood sugars checked.  Agitation: Per patient she's noticed that she's been having increased agitation over the last month. Daughter states that she's had periods of waking up at night walking around the house. Patient was previously on Haldol for agitation at 0.5 mg and this as previously effective. These episodes of agitation jitteriness occur about once to twice per week per daughter and patient.    DM: Checking CBGs?:no How Often?:n/a Blood Sugar Range:n/a Polyuria, polydypsia, polyphagia:possible polyuria  Symptomatic Hypoglycemia:no Medication Compliance?:yes, on metformin  ACE if hypertensive/obesity?:no Statin if LDL >100?:no        O:  Current Outpatient Prescriptions  Medication Sig Dispense Refill  . alendronate (FOSAMAX) 70 MG tablet 1 tab weekly  5 tablet  6  . amLODipine (NORVASC) 5 MG tablet Take 1 tablet (5 mg total) by mouth daily.  60 tablet  11  . aspirin (ASPIR-LOW) 81 MG EC tablet       . BAYER CONTOUR TEST test strip       . Blood Glucose Monitoring Suppl (BLOOD GLUCOSE METER) kit Use as instructed  1 each  0  . calcium citrate-vitamin D (CITRACAL+D) 315-200 MG-UNIT per tablet Take 2 tablets by mouth 2 (two) times daily.        Marland Kitchen donepezil (ARICEPT) 10 MG tablet Take 1 tablet (10 mg total) by mouth at bedtime.  30 tablet  2  . donepezil  (ARICEPT) 5 MG tablet Take 1 tablet (5 mg total) by mouth at bedtime.  30 tablet  0  . haloperidol (HALDOL) 0.5 MG tablet 1 tablet nightly  30 tablet  2  . haloperidol (HALDOL) 0.5 MG tablet 0.5-1mg  nightly for agitation  60 tablet  6  . hydrochlorothiazide (HYDRODIURIL) 25 MG tablet Take 1 tablet (25 mg total) by mouth daily.  30 tablet  11  . memantine (NAMENDA) 5 MG tablet Take 5mg  twice daily for 1 week, then take 10 mg twice daily thereafter  120 tablet  6  . metFORMIN (GLUCOPHAGE) 1000 MG tablet 1000 mg in am and 500 mg in pm  60 tablet  11  . metFORMIN (GLUCOPHAGE) 1000 MG tablet 1000mg  twice daily  60 tablet  11  . mirtazapine (REMERON) 15 MG tablet TAKE ONE TABLET BY MOUTH EVERY DAY AT BEDTIME  30 tablet  6  . omeprazole (PRILOSEC) 40 MG capsule Take 40 mg by mouth daily.        Marland Kitchen DISCONTD: donepezil (ARICEPT) 5 MG tablet Take 2 tablets (10 mg total) by mouth at bedtime.  60 tablet  6  . DISCONTD: haloperidol (HALDOL) 0.5 MG tablet Take 1 tablet (0.5 mg total) by mouth at bedtime.  30 tablet  6    Wt Readings from Last 3 Encounters:  04/04/11 123 lb 1.6 oz (55.838 kg)  12/30/10 125 lb (56.7 kg)  11/16/10 125 lb (56.7 kg)   Temp Readings from Last 3 Encounters:  04/04/11 98.1  F (36.7 C) Oral  12/30/10 98.1 F (36.7 C) Oral  04/22/10 97.8 F (36.6 C) Oral   BP Readings from Last 3 Encounters:  04/04/11 112/66  12/30/10 150/80  11/16/10 151/81   Pulse Readings from Last 3 Encounters:  12/30/10 88  11/16/10 85  06/21/10 78     General: alert and cooperative HEENT: PERRLA and extra ocular movement intact Heart: RRR, mild I-II/VI SEM heard Lungs: clear to auscultation, no wheezes or rales and unlabored breathing Abdomen: abdomen is soft without significant tenderness, masses, organomegaly or guarding Extremities: extremities normal, atraumatic, no cyanosis or edema Skin:no rashes, sacral exam performed with no decubiti ulcers or abrasions noted  Neurology: normal  without focal findings and cranial nerves 2-12 intact   A/P:

## 2011-04-04 NOTE — Assessment & Plan Note (Signed)
Stable. Checking Cr and K. May consider transition to ACE at follow up visit 2/2 to DM.

## 2011-04-04 NOTE — Patient Instructions (Addendum)
It was good to see you today  Dementia: DECREASE Aricept to 5mg  daily for the next month  START namenda 5 mg twice daily for 1 week, then 10mg  twice daily thereafter  I have also increased the haldol for agitation:  Haldol- 0.5-1mg  nightly for agitation   Diabetes: I am checking A1C I have refilled the metformin  I will contact you if we need to change her diabetic medications I am also checking your kidney function  Come back to see me in 3 weeks,  Call if any questions,

## 2011-04-04 NOTE — Assessment & Plan Note (Signed)
Currently on remeron 15 and Haldol 0.5 mg. Will increase haldol to 05.-1mg  at night. Follow up in  3weeks.

## 2011-04-04 NOTE — Telephone Encounter (Signed)
[facility] lab called in critical high glucose of 598 from 0900 on 04/04/11.

## 2011-04-04 NOTE — Assessment & Plan Note (Signed)
Transition from aricept to namenda today given ?procholinergic effects of aricept

## 2011-04-04 NOTE — Assessment & Plan Note (Signed)
DDX includes medication (aricept), infection, and DM. No red flags for infection. UA with neg nitrites and LEs. Given >1000 glucose on UA, would consider uncontrolled DM as likely cause. Aricept is also likely offender. Will decrease aricept. Will check A1C and adjust diabetic regimen accordingly. Infectious red flags discussed.  Follow up in 3 weeks.

## 2011-04-05 ENCOUNTER — Other Ambulatory Visit: Payer: Self-pay | Admitting: Family Medicine

## 2011-04-05 ENCOUNTER — Telehealth: Payer: Self-pay | Admitting: Family Medicine

## 2011-04-05 DIAGNOSIS — E119 Type 2 diabetes mellitus without complications: Secondary | ICD-10-CM

## 2011-04-05 MED ORDER — INSULIN GLARGINE 100 UNIT/ML ~~LOC~~ SOLN: 5.0000 [IU] | Freq: Every day | SUBCUTANEOUS | Status: DC

## 2011-04-05 NOTE — Telephone Encounter (Signed)
Diabetes discussed with patient's daughter. Discussed need for insulin management as well as avoidance of high sugar foods. Discussed with patient's daughter that patient's blood sugars was previously well controlled as of 3 months ago with a hemoglobin A1c of 6 ( yesterday 13). Daughter does report the patient had a significant spike intake of sugary foods that she's been eating. Discussed with daughter that these foods will need to be removed from the environment as to avoid a persistence of hyperglycemia. Discussed  that she may need to be on insulin for a short period time if these foods can be removed and hemoglobin A1c normalizes as it was before. Blood glucose testing kit also called in. Discussed 3 times a day blood sugar checks and followup in the next 7 days. Diabetic and cardiovascular red flags were discussed at length with daughter including DKA and HONK symptoms. Daughter agreeable to plan.

## 2011-04-05 NOTE — Telephone Encounter (Signed)
Ms. Westrich calling back to say she went to pick up mother's insulin rx, but there wasn't rxs for lancets or strips.  Please send rx in to pharmacy for missing items.  Let her know when completed.

## 2011-04-07 ENCOUNTER — Telehealth: Payer: Self-pay | Admitting: Family Medicine

## 2011-04-07 ENCOUNTER — Other Ambulatory Visit: Payer: Self-pay | Admitting: Family Medicine

## 2011-04-07 NOTE — Telephone Encounter (Signed)
Caregiver is calling because Insulin was called in, but they don't have any needles.  Her Blood Sugar is 586.

## 2011-04-07 NOTE — Telephone Encounter (Signed)
Fwd. To PCP for refill. .[name]  

## 2011-04-07 NOTE — Progress Notes (Signed)
Called and talked with patient's daughter. Daughter was able to pick up patient's Lantus solostar pen as well as her blood glucose starter kit.  However, the Lantus solostart pen  did not come with needles. And the blood glucose starter kit only came with 2 lancets and test strips. Daughter was requesting Lantus solostar pen needles, testing strips and , testing lancets. Told patient I would call these in. Diabetic testing supplies as well as Lantus solostar pen needles were called in to pharmacy. Instructed to call pt about these supplies being ready for pick up. Pharmacist agreeable.

## 2011-04-07 NOTE — Telephone Encounter (Signed)
See telephone note.

## 2011-04-14 ENCOUNTER — Encounter: Payer: Self-pay | Admitting: Family Medicine

## 2011-04-14 ENCOUNTER — Ambulatory Visit (INDEPENDENT_AMBULATORY_CARE_PROVIDER_SITE_OTHER): Payer: Medicare Other | Admitting: Family Medicine

## 2011-04-14 VITALS — BP 134/78 | HR 89 | Temp 98.5°F | Ht 64.0 in | Wt 125.0 lb

## 2011-04-14 DIAGNOSIS — E119 Type 2 diabetes mellitus without complications: Secondary | ICD-10-CM

## 2011-04-14 NOTE — Patient Instructions (Addendum)
Lantus: 7 units tonight If morning fasting blood sugar is > 200, then increase Lantus to 9  If you notice fasting blood sugar continued to be > 200 for 3 days- increase to 11  Follow-up with Dr. Alvester Morin

## 2011-04-14 NOTE — Assessment & Plan Note (Signed)
New start to insulin- started lantus 1 week ago.  Making progress in blood sugars- no hypoglycemia.  Will continue to titrate up lantus carefully- see patient instructions.  Patient to follow-up with PCP in 2 weeks

## 2011-04-14 NOTE — Progress Notes (Signed)
  Subjective:    Patient ID: [name], female    DOB: 1940/12/10, 71 y.o.   MRN: [id number]  HPI  Here with daughter to review starting insulin.  Daughter has been checking blood sugars and administering lantus 5 units nightly for 7 days.  Continues to take metformin 1000 bid  She notes that before starting Lantuss fasting morning CBG'S in 400-500's.  Now in low 300's.  No problem with administering lantus- daughter is in healthcare.  complains of aching in left leg.  Not currently painful. Review of Systems    see HPI Objective:   Physical Exam GEN: NAD Leg:  Noted not currently painful- but sometimes has aching over varicose veins in medial left leg.   No redness, warmth, tenderness, or swelling.      Assessment & Plan:

## 2011-04-25 ENCOUNTER — Ambulatory Visit (HOSPITAL_COMMUNITY)
Admission: RE | Admit: 2011-04-25 | Discharge: 2011-04-25 | Disposition: A | Discharge status: Home / Self Care | Payer: Medicare Other | Source: Ambulatory Visit | Attending: Family Medicine | Admitting: Family Medicine

## 2011-04-25 ENCOUNTER — Ambulatory Visit (INDEPENDENT_AMBULATORY_CARE_PROVIDER_SITE_OTHER): Payer: Medicare Other | Admitting: Family Medicine

## 2011-04-25 ENCOUNTER — Ambulatory Visit
Admission: RE | Admit: 2011-04-25 | Discharge: 2011-04-25 | Disposition: A | Discharge status: Home / Self Care | Payer: Medicare Other | Source: Ambulatory Visit | Attending: Family Medicine | Admitting: Family Medicine

## 2011-04-25 ENCOUNTER — Encounter: Payer: Self-pay | Admitting: Family Medicine

## 2011-04-25 ENCOUNTER — Telehealth: Payer: Self-pay | Admitting: Family Medicine

## 2011-04-25 VITALS — BP 129/80 | HR 73 | Ht 64.0 in | Wt 127.0 lb

## 2011-04-25 DIAGNOSIS — J40 Bronchitis, not specified as acute or chronic: Secondary | ICD-10-CM

## 2011-04-25 DIAGNOSIS — R011 Cardiac murmur, unspecified: Secondary | ICD-10-CM | POA: Insufficient documentation

## 2011-04-25 DIAGNOSIS — R05 Cough: Secondary | ICD-10-CM

## 2011-04-25 DIAGNOSIS — H01004 Unspecified blepharitis left upper eyelid: Secondary | ICD-10-CM

## 2011-04-25 DIAGNOSIS — E119 Type 2 diabetes mellitus without complications: Secondary | ICD-10-CM

## 2011-04-25 DIAGNOSIS — I10 Essential (primary) hypertension: Secondary | ICD-10-CM | POA: Insufficient documentation

## 2011-04-25 DIAGNOSIS — H01009 Unspecified blepharitis unspecified eye, unspecified eyelid: Secondary | ICD-10-CM

## 2011-04-25 LAB — COMPREHENSIVE METABOLIC PANEL
ALT: 39 U/L — ABNORMAL HIGH (ref 0–35)
AST: 44 U/L — ABNORMAL HIGH (ref 0–37)
Albumin: 4.6 g/dL (ref 3.5–5.2)
Calcium: 10.1 mg/dL (ref 8.4–10.5)
Chloride: 99 mEq/L (ref 96–112)
Potassium: 3.9 mEq/L (ref 3.5–5.3)
Total Protein: 7.8 g/dL (ref 6.0–8.3)

## 2011-04-25 LAB — LDL CHOLESTEROL, DIRECT: Direct LDL: 111 mg/dL — ABNORMAL HIGH

## 2011-04-25 MED ORDER — AZITHROMYCIN 250 MG PO TABS: ORAL_TABLET | ORAL | Status: AC

## 2011-04-25 NOTE — Patient Instructions (Signed)
It was good to see you today  I am increasing your lantus to 9 units daily. Go back to 7 units if Ms. Woodards blood sugars go below 100.  I am getting a chest xray for her cough.  I am also starting her on an antibiotic  You can use warm compresses to the eye  Come back to see me in 6 weeks,  Call if any questions,  God Bless,  Doree Albee MD   Blepharitis Blepharitis is redness, soreness, and swelling (inflammation) of one or both eyelids. It may be caused by an allergic reaction or a bacterial infection. Blepharitis may also be associated with reddened, scaly skin (seborrhea) of the scalp and eyebrows. While you sleep, eye discharge may cause your eyelashes to stick together. Your eyelids may itch, burn, swell, and may lose their lashes. These will grow back. Your eyes may become sensitive. Blepharitis may recur and need repeated treatment. If this is the case, you may require further evaluation by an eye specialist (ophthalmologist). HOME CARE INSTRUCTIONS   Keep your hands clean.   Use a clean towel each time you dry your eyelids. Do not use this towel to clean other areas. Do not share a towel or makeup with anyone.   Wash your eyelids with warm water or warm water mixed with a small amount of baby shampoo. Do this twice a day or as often as needed.   Wash your face and eyebrows at least once a day.   Use warm compresses 2 times a day for 10 minutes at a time, or as directed by your caregiver.   Apply antibiotic ointment as directed by your caregiver.   Avoid rubbing your eyes.   Avoid wearing makeup until you get better.   Follow up with your caregiver as directed.  SEEK IMMEDIATE MEDICAL CARE IF:   You have pain, redness, or swelling that gets worse or spreads to other parts of your face.   Your vision changes, or you have pain when looking at lights or moving objects.   You have a fever.   Your symptoms continue for longer than 2 to 4 days or become worse.  MAKE  SURE YOU:   Understand these instructions.   Will watch your condition.   Will get help right away if you are not doing well or get worse.  Document Released: 12/18/1999 Document Revised: 12/09/2010 Document Reviewed: 01/27/2010 [facility] Patient Information 2012 [place], [place].

## 2011-04-25 NOTE — Telephone Encounter (Signed)
Nurse from the center has received some paperwork, but the list of meds was not included.  She needs the list of meds faxed to [phone].

## 2011-04-25 NOTE — Assessment & Plan Note (Signed)
Warm compresses to affected area. No signs of infection. Handout given. Will follow prn.

## 2011-04-25 NOTE — Assessment & Plan Note (Signed)
Improved with lantus 7 units. WIll increase to lantus 9 units. Goal is to keep CBGs under 200. Hypoglycemia red flags discussed. Follow up in 4-6 weeks to reevaluate.

## 2011-04-25 NOTE — Progress Notes (Signed)
S:  Pt presents today for diabetic follow up as well as 2 acute issues.  DM: Checking CBGs?:yes How Often?:2-3 times per day  Blood Sugar Range:200s-300s Polyuria, polydypsia, polyphagia:no Symptomatic Hypoglycemia:no Medication Compliance?:yes    Lab Results  Component Value Date   HGBA1C 13.6 04/04/2011     Chemistry      Component Value Date/Time   NA 132* 04/04/2011 0943   K 3.7 04/04/2011 0943   CL 94* 04/04/2011 0943   CO2 24 04/04/2011 0943   BUN 20 04/04/2011 0943   CREATININE 1.19* 04/04/2011 0943   CREATININE 1.06 03/17/2010 2020      Component Value Date/Time   CALCIUM 9.5 04/04/2011 0943   ALKPHOS 97 05/15/2009 2032   AST 36 05/15/2009 2032   ALT 28 05/15/2009 2032   BILITOT 0.7 05/15/2009 2032      Cough: x 2 weeks, intermittently productive. No fevers. Possible sick contacts at adult daycare. No rhinorrhea, nasal congestion. No increased WOB or wheezing. No medications tried. Sxs have been relatively unchanged since onset of sxs. No smoker. No hx/o lung disease.    L eyelid irritation: X1-2 weeks. Mainly L upper eyelid. No drainage. Mild redness and irritation. No eye pain. No blurry vision.  Hx/o blepharitis in the past.       O:  Current Outpatient Prescriptions  Medication Sig Dispense Refill  . alendronate (FOSAMAX) 70 MG tablet 1 tab weekly  5 tablet  6  . amLODipine (NORVASC) 5 MG tablet Take 1 tablet (5 mg total) by mouth daily.  60 tablet  11  . aspirin (ASPIR-LOW) 81 MG EC tablet       . BAYER CONTOUR TEST test strip       . Blood Glucose Monitoring Suppl (BLOOD GLUCOSE METER) kit Use as instructed  1 each  0  . calcium citrate-vitamin D (CITRACAL+D) 315-200 MG-UNIT per tablet Take 2 tablets by mouth 2 (two) times daily.        Marland Kitchen donepezil (ARICEPT) 10 MG tablet Take 1 tablet (10 mg total) by mouth at bedtime.  30 tablet  2  . donepezil (ARICEPT) 5 MG tablet Take 1 tablet (5 mg total) by mouth at bedtime.  30 tablet  0  . haloperidol (HALDOL) 0.5 MG tablet 1  tablet nightly  30 tablet  2  . haloperidol (HALDOL) 0.5 MG tablet 0.5-1mg  nightly for agitation  60 tablet  6  . hydrochlorothiazide (HYDRODIURIL) 25 MG tablet Take 1 tablet (25 mg total) by mouth daily.  30 tablet  11  . insulin glargine (LANTUS SOLOSTAR) 100 UNIT/ML injection Inject 5 Units into the skin at bedtime.  5 pen  6  . memantine (NAMENDA) 5 MG tablet Take 5mg  twice daily for 1 week, then take 10 mg twice daily thereafter  120 tablet  6  . metFORMIN (GLUCOPHAGE) 1000 MG tablet 1000 mg in am and 500 mg in pm  60 tablet  11  . metFORMIN (GLUCOPHAGE) 1000 MG tablet 1000mg  twice daily  60 tablet  11  . mirtazapine (REMERON) 15 MG tablet TAKE ONE TABLET BY MOUTH EVERY DAY AT BEDTIME  30 tablet  6  . omeprazole (PRILOSEC) 40 MG capsule Take 40 mg by mouth daily.          Wt Readings from Last 3 Encounters:  04/25/11 127 lb (57.607 kg)  04/14/11 125 lb (56.7 kg)  04/04/11 123 lb 1.6 oz (55.838 kg)   Temp Readings from Last 3 Encounters:  04/14/11 98.5 F (  36.9 C) Oral  04/04/11 98.1 F (36.7 C) Oral  12/30/10 98.1 F (36.7 C) Oral   BP Readings from Last 3 Encounters:  04/25/11 129/80  04/14/11 134/78  04/04/11 112/66   Pulse Readings from Last 3 Encounters:  04/25/11 73  04/14/11 89  12/30/10 88    General: alert and cooperative HEENT: PERRLA, extra ocular movement intact and noted L upper eyelid swelling, mild erythema, no discharge Heart: S1, S2 normal, no murmur, rub or gallop, regular rate and rhythm Lungs: clear to auscultation, no wheezes or rales and unlabored breathing Abdomen: abdomen is soft without significant tenderness, masses, organomegaly or guarding Extremities: extremities normal, atraumatic, no cyanosis or edema Skin:no rashes Neurology: normal without focal findings  EKG: Sinus rhythm with fusion complexes, no ST or T wave abnormalities. QT 392/QTc 423  A/P:

## 2011-04-25 NOTE — Assessment & Plan Note (Signed)
Likely viral vs. Allergic etiology. Given age and comorbidities, will cover with azithromycin. WIll also obtain CXR. Discussed respiratory and infectious red flags. Follow up prn.

## 2011-04-25 NOTE — Telephone Encounter (Signed)
Done

## 2011-04-28 ENCOUNTER — Telehealth: Payer: Self-pay | Admitting: Family Medicine

## 2011-04-28 ENCOUNTER — Encounter: Payer: Self-pay | Admitting: Family Medicine

## 2011-04-28 DIAGNOSIS — J189 Pneumonia, unspecified organism: Secondary | ICD-10-CM

## 2011-04-28 DIAGNOSIS — M899 Disorder of bone, unspecified: Secondary | ICD-10-CM

## 2011-04-28 NOTE — Telephone Encounter (Signed)
Called to follow up with pt's caregiver Eber Jones (daughter) about CXR results. Also discussed need for further imaging of humerus. Told her to go for repeat imaging in 10-14 days. Daughter agreeable.

## 2011-05-09 ENCOUNTER — Ambulatory Visit
Admission: RE | Admit: 2011-05-09 | Discharge: 2011-05-09 | Disposition: A | Discharge status: Home / Self Care | Payer: Medicare Other | Source: Ambulatory Visit | Attending: Family Medicine | Admitting: Family Medicine

## 2011-05-09 DIAGNOSIS — M899 Disorder of bone, unspecified: Secondary | ICD-10-CM

## 2011-05-09 DIAGNOSIS — J189 Pneumonia, unspecified organism: Secondary | ICD-10-CM

## 2011-05-10 ENCOUNTER — Telehealth: Payer: Self-pay | Admitting: Family Medicine

## 2011-05-10 MED ORDER — ACCU-CHEK SOFT TOUCH LANCETS MISC: Status: DC

## 2011-05-10 NOTE — Telephone Encounter (Signed)
Got the Accucheck meter and is now out of lancets - needs a new script for this  [facility]- [address]

## 2011-05-26 ENCOUNTER — Telehealth: Payer: Self-pay | Admitting: Family Medicine

## 2011-05-26 NOTE — Telephone Encounter (Signed)
Pt has not heard anything about xray results

## 2011-05-26 NOTE — Telephone Encounter (Signed)
Forward to PCP to review results 

## 2011-06-06 ENCOUNTER — Encounter: Payer: Self-pay | Admitting: Family Medicine

## 2011-06-06 ENCOUNTER — Ambulatory Visit (INDEPENDENT_AMBULATORY_CARE_PROVIDER_SITE_OTHER): Payer: Medicare Other | Admitting: Family Medicine

## 2011-06-06 VITALS — BP 144/75 | HR 78 | Ht 64.0 in | Wt 125.0 lb

## 2011-06-06 DIAGNOSIS — R05 Cough: Secondary | ICD-10-CM

## 2011-06-06 DIAGNOSIS — E119 Type 2 diabetes mellitus without complications: Secondary | ICD-10-CM

## 2011-06-06 MED ORDER — INSULIN GLARGINE 100 UNIT/ML ~~LOC~~ SOLN: 10.0000 [IU] | Freq: Every day | SUBCUTANEOUS | Status: DC

## 2011-06-06 MED ORDER — OMEPRAZOLE 40 MG PO CPDR: 40.0000 mg | DELAYED_RELEASE_CAPSULE | Freq: Every day | ORAL | Status: AC

## 2011-06-06 MED ORDER — DONEPEZIL HCL 5 MG PO TABS: 5.0000 mg | ORAL_TABLET | Freq: Every day | ORAL | Status: DC

## 2011-06-06 NOTE — Assessment & Plan Note (Signed)
Spot CBG today. WIll increase lantus to 10units at night. Next A1C due 07/2011. May consider transition to sliding scale + lantus if A1C still above 10.

## 2011-06-06 NOTE — Assessment & Plan Note (Signed)
Clinically improved but still lingering. Suspect it may be from intreated reflux. Will restart on ppi. Discussed resp red flags. Instructions given. Follow up as needed.

## 2011-06-06 NOTE — Progress Notes (Signed)
  Subjective:    Patient ID: [name], female    DOB: May 29, 1940, 71 y.o.   MRN: [id number]  HPI Pt presents today for follow up visit:  Cough:  Was treated for bronchitis 04/2011.  Azithromycin for atpical coverage.  CXR was obtained that showed PNA at the time.  Pt clinically improved, but has still had some persistence of cough.  Cough mainly at night and in the am.  No rhinorrhea or nasal congestion.  No fevers, chills, unintentional weight loss.  Has been off of reflux medication for 3-4 months.  Diabetes:  Checking CBGs?:yes How Often?:bid-tid Blood Sugar Range:140s-230s Polyuria, polydypsia, polyphagia:no Symptomatic Hypoglycemia:no Medication Compliance?:yes  Review of Systems See HPI, otherwise ROS negative     Objective:   Physical Exam Gen: up in chair, NAD HEENT: NCAT, EOMI, TMs clear bilaterally CV: RRR, no murmurs auscultated PULM: CTAB, no wheezes, rales, rhoncii ABD: S/NT/+ bowel sounds  EXT: 2+ peripheral pulses   Assessment & Plan:

## 2011-06-06 NOTE — Patient Instructions (Signed)
It was good to see today I'm increasing your Lantus to 10 units at night Restart the Prilosec Come back next month for a diabetic followup Call if any questions

## 2011-07-15 ENCOUNTER — Other Ambulatory Visit: Payer: Self-pay | Admitting: *Deleted

## 2011-07-15 DIAGNOSIS — I10 Essential (primary) hypertension: Secondary | ICD-10-CM

## 2011-07-15 MED ORDER — AMLODIPINE BESYLATE 5 MG PO TABS: 5.0000 mg | ORAL_TABLET | Freq: Every day | ORAL | Status: DC

## 2011-10-23 ENCOUNTER — Other Ambulatory Visit: Payer: Self-pay | Admitting: Family Medicine

## 2011-11-06 ENCOUNTER — Other Ambulatory Visit: Payer: Self-pay | Admitting: Family Medicine

## 2011-11-14 ENCOUNTER — Encounter: Payer: Self-pay | Admitting: Home Health Services

## 2011-11-16 ENCOUNTER — Encounter: Payer: Self-pay | Admitting: Home Health Services

## 2011-12-07 ENCOUNTER — Other Ambulatory Visit: Payer: Self-pay | Admitting: Family Medicine

## 2011-12-07 NOTE — Telephone Encounter (Signed)
No paper chart °

## 2012-02-14 ENCOUNTER — Encounter (HOSPITAL_COMMUNITY): Payer: Self-pay | Admitting: *Deleted

## 2012-02-14 ENCOUNTER — Inpatient Hospital Stay (HOSPITAL_COMMUNITY)
Admission: EM | Admit: 2012-02-14 | Discharge: 2012-02-17 | DRG: 812 | Disposition: A | Discharge status: Home / Self Care | Payer: Medicare Other | Attending: Internal Medicine | Admitting: Internal Medicine

## 2012-02-14 ENCOUNTER — Other Ambulatory Visit: Payer: Self-pay | Admitting: Family Medicine

## 2012-02-14 DIAGNOSIS — M810 Age-related osteoporosis without current pathological fracture: Secondary | ICD-10-CM

## 2012-02-14 DIAGNOSIS — R011 Cardiac murmur, unspecified: Secondary | ICD-10-CM

## 2012-02-14 DIAGNOSIS — G309 Alzheimer's disease, unspecified: Secondary | ICD-10-CM | POA: Diagnosis present

## 2012-02-14 DIAGNOSIS — F02818 Dementia in other diseases classified elsewhere, unspecified severity, with other behavioral disturbance: Secondary | ICD-10-CM

## 2012-02-14 DIAGNOSIS — F3289 Other specified depressive episodes: Secondary | ICD-10-CM

## 2012-02-14 DIAGNOSIS — D649 Anemia, unspecified: Secondary | ICD-10-CM

## 2012-02-14 DIAGNOSIS — M79651 Pain in right thigh: Secondary | ICD-10-CM

## 2012-02-14 DIAGNOSIS — K922 Gastrointestinal hemorrhage, unspecified: Secondary | ICD-10-CM | POA: Diagnosis present

## 2012-02-14 DIAGNOSIS — F329 Major depressive disorder, single episode, unspecified: Secondary | ICD-10-CM

## 2012-02-14 DIAGNOSIS — E1165 Type 2 diabetes mellitus with hyperglycemia: Secondary | ICD-10-CM

## 2012-02-14 DIAGNOSIS — IMO0002 Reserved for concepts with insufficient information to code with codable children: Secondary | ICD-10-CM

## 2012-02-14 DIAGNOSIS — R350 Frequency of micturition: Secondary | ICD-10-CM

## 2012-02-14 DIAGNOSIS — H9201 Otalgia, right ear: Secondary | ICD-10-CM

## 2012-02-14 DIAGNOSIS — K5900 Constipation, unspecified: Secondary | ICD-10-CM

## 2012-02-14 DIAGNOSIS — E119 Type 2 diabetes mellitus without complications: Secondary | ICD-10-CM | POA: Diagnosis present

## 2012-02-14 DIAGNOSIS — Z9089 Acquired absence of other organs: Secondary | ICD-10-CM

## 2012-02-14 DIAGNOSIS — Z79899 Other long term (current) drug therapy: Secondary | ICD-10-CM

## 2012-02-14 DIAGNOSIS — D62 Acute posthemorrhagic anemia: Principal | ICD-10-CM | POA: Diagnosis present

## 2012-02-14 DIAGNOSIS — R059 Cough, unspecified: Secondary | ICD-10-CM

## 2012-02-14 DIAGNOSIS — E785 Hyperlipidemia, unspecified: Secondary | ICD-10-CM

## 2012-02-14 DIAGNOSIS — F324 Major depressive disorder, single episode, in partial remission: Secondary | ICD-10-CM | POA: Diagnosis present

## 2012-02-14 DIAGNOSIS — D509 Iron deficiency anemia, unspecified: Secondary | ICD-10-CM | POA: Diagnosis present

## 2012-02-14 DIAGNOSIS — H01004 Unspecified blepharitis left upper eyelid: Secondary | ICD-10-CM

## 2012-02-14 DIAGNOSIS — Z7982 Long term (current) use of aspirin: Secondary | ICD-10-CM

## 2012-02-14 DIAGNOSIS — I10 Essential (primary) hypertension: Secondary | ICD-10-CM

## 2012-02-14 DIAGNOSIS — K5730 Diverticulosis of large intestine without perforation or abscess without bleeding: Secondary | ICD-10-CM | POA: Diagnosis present

## 2012-02-14 DIAGNOSIS — Z9889 Other specified postprocedural states: Secondary | ICD-10-CM

## 2012-02-14 DIAGNOSIS — F0280 Dementia in other diseases classified elsewhere without behavioral disturbance: Secondary | ICD-10-CM | POA: Diagnosis present

## 2012-02-14 DIAGNOSIS — N3941 Urge incontinence: Secondary | ICD-10-CM

## 2012-02-14 DIAGNOSIS — R05 Cough: Secondary | ICD-10-CM

## 2012-02-14 DIAGNOSIS — K068 Other specified disorders of gingiva and edentulous alveolar ridge: Secondary | ICD-10-CM

## 2012-02-14 DIAGNOSIS — F0281 Dementia in other diseases classified elsewhere with behavioral disturbance: Secondary | ICD-10-CM | POA: Diagnosis present

## 2012-02-14 DIAGNOSIS — R5381 Other malaise: Secondary | ICD-10-CM | POA: Diagnosis present

## 2012-02-14 DIAGNOSIS — F411 Generalized anxiety disorder: Secondary | ICD-10-CM

## 2012-02-14 HISTORY — DX: Inflammatory liver disease, unspecified: K75.9

## 2012-02-14 HISTORY — DX: Unspecified dementia, unspecified severity, without behavioral disturbance, psychotic disturbance, mood disturbance, and anxiety: F03.90

## 2012-02-14 LAB — POCT I-STAT, CHEM 8
BUN: 21 mg/dL (ref 6–23)
Calcium, Ion: 1.26 mmol/L (ref 1.13–1.30)
Chloride: 104 mEq/L (ref 96–112)
Creatinine, Ser: 0.9 mg/dL (ref 0.50–1.10)
Glucose, Bld: 233 mg/dL — ABNORMAL HIGH (ref 70–99)
HCT: 23 % — ABNORMAL LOW (ref 36.0–46.0)
Hemoglobin: 7.8 g/dL — ABNORMAL LOW (ref 12.0–15.0)
Potassium: 4.3 mEq/L (ref 3.5–5.1)
Sodium: 140 mEq/L (ref 135–145)
TCO2: 25 mmol/L (ref 0–100)

## 2012-02-14 LAB — CBC WITH DIFFERENTIAL/PLATELET
Basophils Absolute: 0.1 10*3/uL (ref 0.0–0.1)
Lymphs Abs: 3.6 10*3/uL (ref 0.7–4.0)
MCH: 18.4 pg — ABNORMAL LOW (ref 26.0–34.0)
MCHC: 29 g/dL — ABNORMAL LOW (ref 30.0–36.0)
MCV: 63.5 fL — ABNORMAL LOW (ref 78.0–100.0)
Monocytes Absolute: 0.7 10*3/uL (ref 0.1–1.0)
Platelets: 271 10*3/uL (ref 150–400)
RDW: 18.1 % — ABNORMAL HIGH (ref 11.5–15.5)

## 2012-02-14 LAB — RETICULOCYTES
RBC.: 3.31 MIL/uL — ABNORMAL LOW (ref 3.87–5.11)
Retic Count, Absolute: 62.9 10*3/uL (ref 19.0–186.0)
Retic Ct Pct: 1.9 % (ref 0.4–3.1)

## 2012-02-14 LAB — OCCULT BLOOD, POC DEVICE: Fecal Occult Bld: POSITIVE — AB

## 2012-02-14 LAB — ABO/RH: ABO/RH(D): O POS

## 2012-02-14 LAB — GLUCOSE, CAPILLARY: Glucose-Capillary: 197 mg/dL — ABNORMAL HIGH (ref 70–99)

## 2012-02-14 MED ORDER — CALCIUM CARBONATE-VITAMIN D 500-200 MG-UNIT PO TABS
1.0000 | ORAL_TABLET | Freq: Every day | ORAL | Status: DC
  Administered 2012-02-15 – 2012-02-17 (×3): 1 via ORAL
  Filled 2012-02-14 (×3): qty 1

## 2012-02-14 MED ORDER — CALCIUM CITRATE-VITAMIN D 315-200 MG-UNIT PO TABS: 1.0000 | ORAL_TABLET | Freq: Every day | ORAL | Status: DC

## 2012-02-14 MED ORDER — ONDANSETRON HCL 4 MG PO TABS: 4.0000 mg | ORAL_TABLET | Freq: Four times a day (QID) | ORAL | Status: DC | PRN

## 2012-02-14 MED ORDER — SODIUM CHLORIDE 0.9 % IV SOLN
INTRAVENOUS | Status: DC
  Administered 2012-02-15 – 2012-02-16 (×4): via INTRAVENOUS

## 2012-02-14 MED ORDER — AMLODIPINE BESYLATE 5 MG PO TABS
5.0000 mg | ORAL_TABLET | Freq: Every day | ORAL | Status: DC
  Administered 2012-02-15 – 2012-02-17 (×3): 5 mg via ORAL
  Filled 2012-02-14 (×3): qty 1

## 2012-02-14 MED ORDER — DOCUSATE SODIUM 100 MG PO CAPS
100.0000 mg | ORAL_CAPSULE | Freq: Two times a day (BID) | ORAL | Status: DC
  Administered 2012-02-15 – 2012-02-17 (×6): 100 mg via ORAL
  Filled 2012-02-14 (×7): qty 1

## 2012-02-14 MED ORDER — MIRTAZAPINE 15 MG PO TABS
15.0000 mg | ORAL_TABLET | Freq: Every day | ORAL | Status: DC
  Administered 2012-02-15 – 2012-02-16 (×3): 15 mg via ORAL
  Filled 2012-02-14 (×4): qty 1

## 2012-02-14 MED ORDER — DONEPEZIL HCL 5 MG PO TABS
5.0000 mg | ORAL_TABLET | Freq: Every day | ORAL | Status: DC
  Administered 2012-02-15 – 2012-02-16 (×3): 5 mg via ORAL
  Filled 2012-02-14 (×4): qty 1

## 2012-02-14 MED ORDER — FUROSEMIDE 10 MG/ML IJ SOLN
20.0000 mg | Freq: Once | INTRAMUSCULAR | Status: AC
  Administered 2012-02-15: 20 mg via INTRAVENOUS
  Filled 2012-02-14 (×2): qty 2

## 2012-02-14 MED ORDER — INSULIN ASPART 100 UNIT/ML ~~LOC~~ SOLN
0.0000 [IU] | Freq: Three times a day (TID) | SUBCUTANEOUS | Status: DC
  Administered 2012-02-15: 1 [IU] via SUBCUTANEOUS
  Administered 2012-02-15 (×2): 2 [IU] via SUBCUTANEOUS
  Administered 2012-02-16: 1 [IU] via SUBCUTANEOUS
  Administered 2012-02-16: 2 [IU] via SUBCUTANEOUS
  Administered 2012-02-16: 1 [IU] via SUBCUTANEOUS
  Administered 2012-02-17: 2 [IU] via SUBCUTANEOUS

## 2012-02-14 MED ORDER — PANTOPRAZOLE SODIUM 40 MG IV SOLR
40.0000 mg | Freq: Two times a day (BID) | INTRAVENOUS | Status: DC
  Administered 2012-02-15 – 2012-02-17 (×6): 40 mg via INTRAVENOUS
  Filled 2012-02-14 (×8): qty 40

## 2012-02-14 MED ORDER — HYDROCHLOROTHIAZIDE 25 MG PO TABS
25.0000 mg | ORAL_TABLET | Freq: Every day | ORAL | Status: DC
  Administered 2012-02-15 – 2012-02-17 (×3): 25 mg via ORAL
  Filled 2012-02-14 (×5): qty 1

## 2012-02-14 MED ORDER — ONDANSETRON HCL 4 MG/2ML IJ SOLN: 4.0000 mg | Freq: Four times a day (QID) | INTRAMUSCULAR | Status: DC | PRN

## 2012-02-14 MED ORDER — SENNA 8.6 MG PO TABS
1.0000 | ORAL_TABLET | Freq: Two times a day (BID) | ORAL | Status: DC
  Administered 2012-02-15 – 2012-02-17 (×6): 8.6 mg via ORAL
  Filled 2012-02-14 (×7): qty 1

## 2012-02-14 MED ORDER — HALOPERIDOL 0.5 MG PO TABS
0.5000 mg | ORAL_TABLET | Freq: Every day | ORAL | Status: DC
  Administered 2012-02-15 – 2012-02-16 (×3): 0.5 mg via ORAL
  Filled 2012-02-14 (×5): qty 1

## 2012-02-14 NOTE — ED Provider Notes (Signed)
History     CSN: [id number]  Arrival date & time 02/14/12  1745   First MD Initiated Contact with Patient 02/14/12 1905      Chief Complaint  Patient presents with  . Anemia    (Consider location/radiation/quality/duration/timing/severity/associated sxs/prior treatment) HPI Comments: Patient sent to the ER by her primary physician after routine blood work showed severe anemia. Patient is a poor historian because she has dementia, but daughter is with her. They deny any history of rectal bleeding or black tarry stools. Patient has not experienced any shortness of breath, heart palpitations, chest pain or passing out.  Patient is a 72 y.o. female presenting with anemia.  Anemia Pertinent negatives include no chest pain and no shortness of breath.    Past Medical History  Diagnosis Date  . Anxiety   . Depression   . Diabetes mellitus   . Hyperlipidemia   . Hypertension   . Dementia     History reviewed. No pertinent past surgical history.  No family history on file.  History  Substance Use Topics  . Smoking status: Never Smoker   . Smokeless tobacco: Not on file  . Alcohol Use: No    OB History   Grav Para Term Preterm Abortions TAB SAB Ect Mult Living                  Review of Systems  Respiratory: Negative for shortness of breath.   Cardiovascular: Negative for chest pain and palpitations.  Neurological: Negative for dizziness.  All other systems reviewed and are negative.    Allergies  Review of patient's allergies indicates no known allergies.  Home Medications   Current Outpatient Rx  Name  Route  Sig  Dispense  Refill  . alendronate (FOSAMAX) 70 MG tablet      1 tab weekly   5 tablet   6   . amLODipine (NORVASC) 5 MG tablet   Oral   Take 1 tablet (5 mg total) by mouth daily.   60 tablet   11   . aspirin (ASPIR-LOW) 81 MG EC tablet               . BAYER CONTOUR TEST test strip               . Blood Glucose Monitoring Suppl  (BLOOD GLUCOSE METER) kit      Use as instructed   1 each   0   . calcium citrate-vitamin D (CITRACAL+D) 315-200 MG-UNIT per tablet   Oral   Take 2 tablets by mouth 2 (two) times daily.           Marland Kitchen EXPIRED: donepezil (ARICEPT) 10 MG tablet   Oral   Take 1 tablet (10 mg total) by mouth at bedtime.   30 tablet   2   . donepezil (ARICEPT) 5 MG tablet   Oral   Take 1 tablet (5 mg total) by mouth at bedtime.   30 tablet   6   . EXPIRED: haloperidol (HALDOL) 0.5 MG tablet      1 tablet nightly   30 tablet   2   . haloperidol (HALDOL) 0.5 MG tablet      0.5-1mg  nightly for agitation   60 tablet   6   . hydrochlorothiazide (HYDRODIURIL) 25 MG tablet   Oral   Take 1 tablet (25 mg total) by mouth daily.   30 tablet   11   . insulin glargine (LANTUS SOLOSTAR) 100 UNIT/ML injection  Subcutaneous   Inject 10 Units into the skin at bedtime.   5 pen   6   . Lancets (ACCU-CHEK SOFT TOUCH) lancets      Use as instructed   100 each   12   . memantine (NAMENDA) 5 MG tablet      Take 5mg  twice daily for 1 week, then take 10 mg twice daily thereafter   120 tablet   6   . EXPIRED: metFORMIN (GLUCOPHAGE) 1000 MG tablet      1000 mg in am and 500 mg in pm   60 tablet   11   . metFORMIN (GLUCOPHAGE) 1000 MG tablet      1000mg  twice daily   60 tablet   11   . mirtazapine (REMERON) 15 MG tablet      TAKE ONE TABLET BY MOUTH EVERY DAY AT BEDTIME   30 tablet   6   . omeprazole (PRILOSEC) 40 MG capsule   Oral   Take 1 capsule (40 mg total) by mouth daily.   30 capsule   6     BP 145/71  Pulse 91  Temp(Src) 99 F (37.2 C) (Oral)  Resp 16  SpO2 100%  Physical Exam  Constitutional: She is oriented to person, place, and time. She appears well-developed and well-nourished. No distress.  HENT:  Head: Normocephalic and atraumatic.  Right Ear: Hearing normal.  Nose: Nose normal.  Mouth/Throat: Oropharynx is clear and moist and mucous membranes are normal.   Eyes: Conjunctivae and EOM are normal. Pupils are equal, round, and reactive to light.  Neck: Normal range of motion. Neck supple.  Cardiovascular: Normal rate, regular rhythm, S1 normal and S2 normal.  Exam reveals no gallop and no friction rub.   No murmur heard. Pulmonary/Chest: Effort normal and breath sounds normal. No respiratory distress. She exhibits no tenderness.  Abdominal: Soft. Normal appearance and bowel sounds are normal. There is no hepatosplenomegaly. There is no tenderness. There is no rebound, no guarding, no tenderness at McBurney's point and negative Murphy's sign. No hernia.  Musculoskeletal: Normal range of motion.  Neurological: She is alert and oriented to person, place, and time. She has normal strength. No cranial nerve deficit or sensory deficit. Coordination normal. GCS eye subscore is 4. GCS verbal subscore is 5. GCS motor subscore is 6.  Skin: Skin is warm, dry and intact. No rash noted. No cyanosis.  Psychiatric: She has a normal mood and affect. Her speech is normal and behavior is normal. Thought content normal.    ED Course  Procedures (including critical care time)  Labs Reviewed  CBC WITH DIFFERENTIAL - Abnormal; Notable for the following:    RBC 3.42 (*)    Hemoglobin 6.3 (*)    HCT 21.7 (*)    MCV 63.5 (*)    MCH 18.4 (*)    MCHC 29.0 (*)    RDW 18.1 (*)    All other components within normal limits  POCT I-STAT, CHEM 8 - Abnormal; Notable for the following:    Glucose, Bld 233 (*)    Hemoglobin 7.8 (*)    HCT 23.0 (*)    All other components within normal limits   No results found.   Diagnoses: 1. GI bleed 2. Anemia    MDM  Patient was sent to the ER for evaluation of anemia found on routine blood work. Patient is profoundly anemic, but hemodynamically stable. She is a poor historian because of her dementia. She did have heme  positive stool here in the ER. It is suspected that she has had ongoing GI bleeding causing the anemia and will  require workup. She also required blood transfusion. Patient to be hospitalized for further treatment and evaluation. Discussed with Dr. Eben Burow.        Gilda Crease, MD 02/14/12 2004

## 2012-02-14 NOTE — H&P (Signed)
History and Physical  [name] WJX:[id number] DOB: [birth year] DOA: 02/14/2012  Referring physician: ER PCP: Cala Bradford, MD   Chief Complaint: Lab abnormality  HPI: Patient sent to the ER by her primary physician after routine blood work showed severe anemia. Patient is a poor historian because she has dementia, but daughter is with her. The daughter mentioned that her mom had complained of bleeding from her bottom 2 months ago and has had several such episodes in last few months. The patient thought that she has started having periods again. Each episode would last 1-2 days. Patient has not experienced any shortness of breath, heart palpitations, chest pain or passing out. No other complaints today. She has never had blood transfusions in the past.   Review of Systems Respiratory: Negative for shortness of breath.  Cardiovascular: Negative for chest pain and palpitations.  Neurological: Negative for dizziness.  All other systems reviewed and are negative    Past Medical History  Diagnosis Date  . Anxiety   . Depression   . Diabetes mellitus   . Hyperlipidemia   . Hypertension   . Dementia     History reviewed. No pertinent past surgical history.  Social History:  reports that she has never smoked. She does not have any smokeless tobacco history on file. She reports that she does not drink alcohol or use illicit drugs.  No Known Allergies  No family history on file.   Prior to Admission medications   Medication Sig Start Date End Date Taking? Authorizing Provider  alendronate (FOSAMAX) 70 MG tablet 1 tab weekly 11/16/10   Doree Albee, MD  amLODipine (NORVASC) 5 MG tablet Take 1 tablet (5 mg total) by mouth daily. 07/15/11 07/14/12  Brent Bulla, MD  aspirin (ASPIR-LOW) 81 MG EC tablet     Historical Provider, MD  BAYER CONTOUR TEST test strip  03/10/10   Historical Provider, MD  Blood Glucose Monitoring Suppl (BLOOD GLUCOSE METER) kit Use as instructed 04/04/11 04/03/12   Doree Albee, MD  calcium citrate-vitamin D (CITRACAL+D) 315-200 MG-UNIT per tablet Take 2 tablets by mouth 2 (two) times daily.      Historical Provider, MD  donepezil (ARICEPT) 10 MG tablet Take 1 tablet (10 mg total) by mouth at bedtime. 04/22/10 04/22/11  Olivia Mackie, MD  donepezil (ARICEPT) 5 MG tablet Take 1 tablet (5 mg total) by mouth at bedtime. 06/06/11   Doree Albee, MD  haloperidol (HALDOL) 0.5 MG tablet 1 tablet nightly 03/17/10 04/17/10  Doree Albee, MD  haloperidol (HALDOL) 0.5 MG tablet 0.5-1mg  nightly for agitation 04/04/11   Doree Albee, MD  hydrochlorothiazide (HYDRODIURIL) 25 MG tablet Take 1 tablet (25 mg total) by mouth daily. 10/27/10   Doree Albee, MD  insulin glargine (LANTUS SOLOSTAR) 100 UNIT/ML injection Inject 10 Units into the skin at bedtime. 06/06/11 06/05/12  Doree Albee, MD  Lancets (ACCU-CHEK SOFT TOUCH) lancets Use as instructed 05/10/11 05/09/12  Doree Albee, MD  memantine [facility]) 5 MG tablet Take 5mg  twice daily for 1 week, then take 10 mg twice daily thereafter 04/04/11   Doree Albee, MD  metFORMIN (GLUCOPHAGE) 1000 MG tablet 1000 mg in am and 500 mg in pm 11/16/10 12/16/10  Doree Albee, MD  metFORMIN (GLUCOPHAGE) 1000 MG tablet 1000mg  twice daily 04/04/11   Doree Albee, MD  mirtazapine (REMERON) 15 MG tablet TAKE ONE TABLET BY MOUTH EVERY DAY AT BEDTIME 11/10/10   Doree Albee, MD  omeprazole (PRILOSEC) 40 MG capsule Take 1 capsule (40 mg  total) by mouth daily. 06/06/11   Doree Albee, MD   Physical Exam: Filed Vitals:   02/14/12 1927 02/14/12 2017 02/14/12 2030 02/14/12 2144  BP: 155/84 157/70 144/77 142/67  Pulse: 95 87 81 84  Temp:      TempSrc:    Oral  Resp:    16  Height:    5\' 1"  (1.549 m)  Weight:    120 lb 1 oz (54.46 kg)  SpO2: 100% 100% 100% 100%   Physical Exam: General: Vital signs reviewed and noted. Well-developed, well-nourished, in no acute distress; alert, appropriate and cooperative throughout examination.  Head:  Normocephalic, atraumatic.  Eyes: PERRL, EOMI, No signs of anemia or jaundince.  Nose: Mucous membranes moist, not inflammed, nonerythematous.  Throat: Oropharynx nonerythematous, no exudate appreciated.   Neck: No deformities, masses, or tenderness noted.Supple, No carotid Bruits, no JVD.  Lungs:  Normal respiratory effort. Clear to auscultation BL without crackles or wheezes.  Heart: RRR. S1 and S2 normal without gallop, murmur, or rubs.  Abdomen:  BS normoactive. Soft, Nondistended, non-tender.  No masses or organomegaly.  Extremities: No pretibial edema.  Neurologic: A&O X3, CN II - XII are grossly intact. Motor strength is 5/5 in the all 4 extremities, Sensations intact to light touch, Cerebellar signs negative.  Rectal exam Done by ER physician and FOBT was found positive  Skin: No visible rashes, scars.     Wt Readings from Last 3 Encounters:  02/14/12 120 lb 1 oz (54.46 kg)  06/06/11 125 lb (56.7 kg)  04/25/11 127 lb (57.607 kg)    Labs on Admission:  Basic Metabolic Panel:  Recent Labs Lab 02/14/12 1845  NA 140  K 4.3  CL 104  GLUCOSE 233*  BUN 21  CREATININE 0.90    CBC:  Recent Labs Lab 02/14/12 1813 02/14/12 1845  WBC 7.9  --   NEUTROABS 3.3  --   HGB 6.3* 7.8*  HCT 21.7* 23.0*  MCV 63.5*  --   PLT 271  --     EKG: Independently reviewed.    Principal Problem:   GIB (gastrointestinal bleeding) Active Problems:   Diabetes mellitus out of control   HYPERLIPIDEMIA   Dementia, Alzheimer's, with behavior disturbance   ANXIETY   DEPRESSION   HYPERTENSION   Anemia   Assessment/Plan # Anemia: Patient was sent to ER by PCP and found to have Hb- 6.3. Her FOBT is positive Admit to med- surg bed Transfuse 1 units PRBC F/U anemia panel Proton[facility] consulted.  # HTN: BP Stable. Continue home - meds of HCTZ and amlodipine.  # Depression/Anxiety: Continue remeron and haldol. Would also continue aricept  # DM: Check AIC. Hold  metformin SSI  # DVT: SCD's  Code Status: Full Family Communication: Family updated at bedside Disposition Plan/Anticipated LOS: Home when stable  Time spent: 70 minutes  Lars Mage, MD  [facility] Team 10  If 7PM-7AM, please contact night-coverage at [email or web address], password [facility] 02/14/2012, 10:29 PM

## 2012-02-14 NOTE — ED Notes (Signed)
Pt had gone to her Dr for routine tests.  Dr called daughter (pt poor historian d/t dementia) and told her to come to ED for a blood transfusion for hgb of 6.  Pt denies any chest pain, shortness of breath, weakness or any other s/s of anemia.

## 2012-02-14 NOTE — ED Notes (Signed)
Lab called with hgb 6.3 and hct 21.7 reported to Dr. Oletta Cohn and no new orders.  Pt in triage

## 2012-02-15 ENCOUNTER — Encounter (HOSPITAL_COMMUNITY): Payer: Self-pay

## 2012-02-15 DIAGNOSIS — F411 Generalized anxiety disorder: Secondary | ICD-10-CM

## 2012-02-15 DIAGNOSIS — K922 Gastrointestinal hemorrhage, unspecified: Secondary | ICD-10-CM

## 2012-02-15 DIAGNOSIS — F0280 Dementia in other diseases classified elsewhere without behavioral disturbance: Secondary | ICD-10-CM

## 2012-02-15 DIAGNOSIS — F0281 Dementia in other diseases classified elsewhere with behavioral disturbance: Secondary | ICD-10-CM

## 2012-02-15 DIAGNOSIS — M810 Age-related osteoporosis without current pathological fracture: Secondary | ICD-10-CM

## 2012-02-15 DIAGNOSIS — F02818 Dementia in other diseases classified elsewhere, unspecified severity, with other behavioral disturbance: Secondary | ICD-10-CM

## 2012-02-15 LAB — TYPE AND SCREEN
Antibody Screen: NEGATIVE
Unit division: 0

## 2012-02-15 LAB — CBC
HCT: 25.1 % — ABNORMAL LOW (ref 36.0–46.0)
Hemoglobin: 7.6 g/dL — ABNORMAL LOW (ref 12.0–15.0)
Hemoglobin: 7.5 g/dL — ABNORMAL LOW (ref 12.0–15.0)
MCH: 19.8 pg — ABNORMAL LOW (ref 26.0–34.0)
MCHC: 30.3 g/dL (ref 30.0–36.0)
RBC: 3.84 MIL/uL — ABNORMAL LOW (ref 3.87–5.11)
RBC: 3.83 MIL/uL — ABNORMAL LOW (ref 3.87–5.11)

## 2012-02-15 LAB — GLUCOSE, CAPILLARY
Glucose-Capillary: 186 mg/dL — ABNORMAL HIGH (ref 70–99)
Glucose-Capillary: 126 mg/dL — ABNORMAL HIGH (ref 70–99)

## 2012-02-15 LAB — VITAMIN B12: Vitamin B-12: 797 pg/mL (ref 211–911)

## 2012-02-15 LAB — IRON AND TIBC

## 2012-02-15 LAB — HEMOGLOBIN A1C: Mean Plasma Glucose: 200 mg/dL — ABNORMAL HIGH (ref <117)

## 2012-02-15 MED ORDER — SODIUM CHLORIDE 0.9 % IV SOLN
INTRAVENOUS | Status: DC
  Administered 2012-02-15: 11:00:00 via INTRAVENOUS
  Administered 2012-02-16: 500 mL via INTRAVENOUS

## 2012-02-15 MED ORDER — PEG 3350-KCL-NA BICARB-NACL 420 G PO SOLR
4000.0000 mL | Freq: Once | ORAL | Status: AC
  Administered 2012-02-15: 4000 mL via ORAL
  Filled 2012-02-15: qty 4000

## 2012-02-15 MED ORDER — ACETAMINOPHEN 325 MG PO TABS: 650.0000 mg | ORAL_TABLET | ORAL | Status: DC | PRN

## 2012-02-15 NOTE — Progress Notes (Signed)
CRITICAL VALUE ALERT  Critical value received:  hgb 7.6  Date of notification:  02/15/12  Time of notification:  638  MD notified (1st page):  [name]  Time of first page:  638  Patient asymptomatic, no SOB, pain or distress noted. Will continue to monitor.   [name]

## 2012-02-15 NOTE — Clinical Documentation Improvement (Signed)
Anemia Blood Loss Clarification  THIS DOCUMENT IS NOT A PERMANENT PART OF THE MEDICAL RECORD  RESPOND TO THE THIS QUERY, FOLLOW THE INSTRUCTIONS BELOW:  1. If needed, update documentation for the patient's encounter via the notes activity.  2. Access this query again and click edit on the In [employer].  3. After updating, or not, click F2 to complete all highlighted (required) fields concerning your review. Select "additional documentation in the medical record" OR "no additional documentation provided".  4. Click Sign note button.  5. The deficiency will fall out of your In Basket *Please let us know if you are not able to complete this workflow by phone or e-mail (listed below).        02/15/12  Dear Dr. Eben Burow Marton Red[place]  In an effort to better capture your patient's severity of illness, reflect appropriate length of stay and utilization of resources, a review of the patient medical record has revealed the following indicators.    Based on your clinical judgment, please clarify and document in a progress note and/or discharge summary the clinical condition associated with the following supporting information:  In responding to this query please exercise your independent judgment.  The fact that a query is asked, does not imply that any particular answer is desired or expected.  Possible Clinical Conditions?   Acute Blood Loss Anemia Acute on chronic blood loss anemia Other Condition Cannot Clinically Determine    Supporting Information:  Risk Factors: (As per notes)"GI BLEED & ANEMIA"  Signs and Symptoms (As per notes) "Anemia: Patient was sent to ER by PCP and found to have Hb- 6.3. Her FOBT is positive"  Diagnostics: LABS: Lab  02/14/12 1813  HGB=7.8 02/14/12 1845  HGB=6.3   Treatments: Admit to med- surg bed Transfuse 1 units PRBC F/U anemia panel [facility] consulted   Reviewed:  no additional documentation provided spoke with Dr. Eben Burow no  ABLA  Thank You,  Joanette Gula Delk RN, BSN, CCDS Clinical Documentation Specialist: [phone] Pager Health Information Management [place]

## 2012-02-15 NOTE — Consult Note (Signed)
[facility] CONSULT Reason for consult: anemia and heme positive stool Referring Physician:  [facility]. PCP: Dr. Laurann Montana  [name] is an 72 y.o. female.  HPI:  72 year old woman who presented to her PCP with vague symptoms and lab work revealed marked anemia and he MacColl positive stool. The patient had been complaining of bleeding from her rectum 2 months ago to her daughter and apparently had several episodes. She also indicated to me, she's had dark stools on several occasions over the past several months. She denies abdominal pain. She notes that she has been taking medication for arthritis and her daughter has given to her but is unable to tell me what it is. She did have colonoscopy in 2008 according to the all records was normal. She denies any known family history of colon cancer or polyps.  Past Medical History  Diagnosis Date  . Anxiety   . Depression   . Diabetes mellitus   . Hyperlipidemia   . Hypertension   . Dementia     Past Surgical History  Procedure Laterality Date  . Thyroidectomy      in 2000    History reviewed. No pertinent family history.  Social History:  reports that she has never smoked. She does not have any smokeless tobacco history on file. She reports that she does not drink alcohol or use illicit drugs.  Allergies: No Known Allergies  Medications; . amLODipine  5 mg Oral Daily  . calcium-vitamin D  1 tablet Oral Daily  . docusate sodium  100 mg Oral BID  . donepezil  5 mg Oral QHS  . haloperidol  0.5 mg Oral QHS  . hydrochlorothiazide  25 mg Oral Daily  . insulin aspart  0-9 Units Subcutaneous TID WC  . mirtazapine  15 mg Oral QHS  . pantoprazole (PROTONIX) IV  40 mg Intravenous Q12H  . senna  1 tablet Oral BID   PRN Meds acetaminophen, ondansetron (ZOFRAN) IV, ondansetron Results for orders placed during the hospital encounter of 02/14/12 (from the past 48 hour(s))  CBC WITH DIFFERENTIAL     Status: Abnormal    Collection Time    02/14/12  6:13 PM      Result Value Range   WBC 7.9  4.0 - 10.5 K/uL   RBC 3.42 (*) 3.87 - 5.11 MIL/uL   Hemoglobin 6.3 (*) 12.0 - 15.0 g/dL   Comment: REPEATED TO VERIFY     SPECIMEN CHECKED FOR CLOTS     CRITICAL RESULT CALLED TO, READ BACK BY AND VERIFIED WITH:     S NEWSOME,RN 1852 02/14/12 WBOND   HCT 21.7 (*) 36.0 - 46.0 %   MCV 63.5 (*) 78.0 - 100.0 fL   MCH 18.4 (*) 26.0 - 34.0 pg   MCHC 29.0 (*) 30.0 - 36.0 g/dL   RDW 11.9 (*) 14.7 - 82.9 %   Platelets 271  150 - 400 K/uL   Neutrophils Relative 42 (*) 43 - 77 %   Lymphocytes Relative 45  12 - 46 %   Monocytes Relative 9  3 - 12 %   Eosinophils Relative 3  0 - 5 %   Basophils Relative 1  0 - 1 %   Neutro Abs 3.3  1.7 - 7.7 K/uL   Lymphs Abs 3.6  0.7 - 4.0 K/uL   Monocytes Absolute 0.7  0.1 - 1.0 K/uL   Eosinophils Absolute 0.2  0.0 - 0.7 K/uL   Basophils Absolute 0.1  0.0 -  0.1 K/uL   RBC Morphology POLYCHROMASIA PRESENT     Comment: ELLIPTOCYTES  POCT I-STAT, CHEM 8     Status: Abnormal   Collection Time    02/14/12  6:45 PM      Result Value Range   Sodium 140  135 - 145 mEq/L   Potassium 4.3  3.5 - 5.1 mEq/L   Chloride 104  96 - 112 mEq/L   BUN 21  6 - 23 mg/dL   Creatinine, Ser 4.54  0.50 - 1.10 mg/dL   Glucose, Bld 098 (*) 70 - 99 mg/dL   Calcium, Ion 1.19  1.47 - 1.30 mmol/L   TCO2 25  0 - 100 mmol/L   Hemoglobin 7.8 (*) 12.0 - 15.0 g/dL   HCT 82.9 (*) 56.2 - 13.0 %  VITAMIN B12     Status: None   Collection Time    02/14/12  7:20 PM      Result Value Range   Vitamin B-12 797  211 - 911 pg/mL  FOLATE     Status: None   Collection Time    02/14/12  7:20 PM      Result Value Range   Folate >20.0     Comment: (NOTE)     Reference Ranges            Deficient:       0.4 - 3.3 ng/mL            Indeterminate:   3.4 - 5.4 ng/mL            Normal:              > 5.4 ng/mL  IRON AND TIBC     Status: Abnormal   Collection Time    02/14/12  7:20 PM      Result Value Range   Iron <10  (*) 42 - 135 ug/dL   TIBC Not calculated due to Iron <10.  250 - 470 ug/dL   Saturation Ratios Not calculated due to Iron <10.  20 - 55 %   UIBC 564 (*) 125 - 400 ug/dL  FERRITIN     Status: Abnormal   Collection Time    02/14/12  7:20 PM      Result Value Range   Ferritin 5 (*) 10 - 291 ng/mL  RETICULOCYTES     Status: Abnormal   Collection Time    02/14/12  7:20 PM      Result Value Range   Retic Ct Pct 1.9  0.4 - 3.1 %   RBC. 3.31 (*) 3.87 - 5.11 MIL/uL   Retic Count, Manual 62.9  19.0 - 186.0 K/uL  OCCULT BLOOD, POC DEVICE     Status: Abnormal   Collection Time    02/14/12  7:22 PM      Result Value Range   Fecal Occult Bld POSITIVE (*) NEGATIVE  TYPE AND SCREEN     Status: None   Collection Time    02/14/12  7:30 PM      Result Value Range   ABO/RH(D) O POS     Antibody Screen NEG     Sample Expiration 02/17/2012     Unit Number [id number]     Blood Component Type RBC LR PHER1     Unit division 00     Status of Unit ISSUED,FINAL     Transfusion Status OK TO TRANSFUSE     Crossmatch Result Compatible    ABO/RH     Status:  None   Collection Time    02/14/12  7:30 PM      Result Value Range   ABO/RH(D) O POS    PREPARE RBC (CROSSMATCH)     Status: None   Collection Time    02/14/12  9:18 PM      Result Value Range   Order Confirmation ORDER PROCESSED BY BLOOD BANK    GLUCOSE, CAPILLARY     Status: Abnormal   Collection Time    02/14/12  9:56 PM      Result Value Range   Glucose-Capillary 197 (*) 70 - 99 mg/dL  CBC     Status: Abnormal   Collection Time    02/15/12  5:50 AM      Result Value Range   WBC 7.9  4.0 - 10.5 K/uL   RBC 3.84 (*) 3.87 - 5.11 MIL/uL   Hemoglobin 7.6 (*) 12.0 - 15.0 g/dL   HCT 40.9 (*) 81.1 - 91.4 %   MCV 65.4 (*) 78.0 - 100.0 fL   MCH 19.8 (*) 26.0 - 34.0 pg   MCHC 30.3  30.0 - 36.0 g/dL   RDW 78.2 (*) 95.6 - 21.3 %   Platelets 263  150 - 400 K/uL  GLUCOSE, CAPILLARY     Status: Abnormal   Collection Time    02/15/12  7:35  AM      Result Value Range   Glucose-Capillary 187 (*) 70 - 99 mg/dL   Comment 1 Documented in Chart     Comment 2 Notify RN      No results found. ROS: patient denies all symptoms other than feeling tired. Constitutional: HEENT: negative Cardiovascular: denies chest pain or heart problems Respiratory: denies problems with her lungs or shortness of breath GI: as above GU: negative Musculoskeletal: negative Neuro/Psychiatric: negative Endocrine/Heme: she does have diabetes            Blood pressure 111/59, pulse 71, temperature 98.3 F (36.8 C), temperature source Oral, resp. rate 18, height 5\' 1"  (1.549 m), weight 54.46 kg (120 lb 1 oz), SpO2 100.00%.  Physical exam:   Gen. -- alert and oriented. She is in no distress Eyes -- sclera nod nonicteric Lungs -- clear Heart -- regular rate and rhythm without murmurs are gallops Abdomen -- soft and nontender with normal bowel sounds.  Assessment: 1. Heme positive stool/profound iron deficiency anemia. Patient give some symptoms that could indicate upper G.I. source as well as symptoms indicating possible lower G.I. source. I think she should have both EGD colonoscopy and she is agreeable to this.  Plan: we will plan EGD colonoscopy tomorrow. The patient is agreeable and I will discuss with the daughter. We did leave a message on the daughter cell phone to call us.   Gelena Klosinski JR,Vanissa Strength L 02/15/2012, 10:04 AM

## 2012-02-15 NOTE — Progress Notes (Signed)
INITIAL NUTRITION ASSESSMENT  DOCUMENTATION CODES Per approved criteria  -Not Applicable   INTERVENTION:  Advance diet as medically appropriate RD to follow for nutrition care plan, add supplements when able  NUTRITION DIAGNOSIS: Inadequate oral intake related to inability to eat as evidenced by NPO status  Goal: Oral intake with meals & supplements to meet >/= 90% of estimated nutrition needs  Monitor:  PO diet advancement & intake, weight, labs, I/O's  Reason for Assessment: Malnutrition Screening Tool Report  72 y.o. female  Admitting Dx: GIB (gastrointestinal bleeding)  ASSESSMENT: Patient sent to ER by her primary physician after routine blood work showed severe anemia; GI note reviewed 2/12 ---> plan for EGD tomorrow; patient reports her appetite "isn't too good but getting better;" no significant weight loss reported; NPO at this time, however, would like addition of nutrition supplements when/as able.  Height: Ht Readings from Last 1 Encounters:  02/14/12 5\' 1"  (1.549 m)    Weight: Wt Readings from Last 1 Encounters:  02/14/12 120 lb 1 oz (54.46 kg)    Ideal Body Weight: 105 lb  % Ideal Body Weight: 114%  Wt Readings from Last 10 Encounters:  02/14/12 120 lb 1 oz (54.46 kg)  06/06/11 125 lb (56.7 kg)  04/25/11 127 lb (57.607 kg)  04/14/11 125 lb (56.7 kg)  04/04/11 123 lb 1.6 oz (55.838 kg)  12/30/10 125 lb (56.7 kg)  11/16/10 125 lb (56.7 kg)  06/21/10 115 lb (52.164 kg)  06/07/10 116 lb 11.2 oz (52.935 kg)  04/22/10 118 lb 4.8 oz (53.661 kg)    Usual Body Weight: 125 lb  % Usual Body Weight: 96%  BMI:  Body mass index is 22.7 kg/(m^2).  Estimated Nutritional Needs: Kcal: 1450-1650 Protein: 70-80 gm Fluid: > 1.5 L  Skin: Intact  Diet Order: NPO  EDUCATION NEEDS: -No education needs identified at this time   Intake/Output Summary (Last 24 hours) at 02/15/12 1133 Last data filed at 02/15/12 1124  Gross per 24 hour  Intake 1014.17 ml   Output    400 ml  Net 614.17 ml    Last BM: 2/10  Labs:   Recent Labs Lab 02/14/12 1845  NA 140  K 4.3  CL 104  BUN 21  CREATININE 0.90  GLUCOSE 233*    CBG (last 3)   Recent Labs  02/14/12 2156 02/15/12 0735  GLUCAP 197* 187*    Scheduled Meds: . amLODipine  5 mg Oral Daily  . calcium-vitamin D  1 tablet Oral Daily  . docusate sodium  100 mg Oral BID  . donepezil  5 mg Oral QHS  . haloperidol  0.5 mg Oral QHS  . hydrochlorothiazide  25 mg Oral Daily  . insulin aspart  0-9 Units Subcutaneous TID WC  . mirtazapine  15 mg Oral QHS  . pantoprazole (PROTONIX) IV  40 mg Intravenous Q12H  . polyethylene glycol-electrolytes  4,000 mL Oral Once  . senna  1 tablet Oral BID    Continuous Infusions: . sodium chloride 50 mL/hr at 02/15/12 0129  . sodium chloride 20 mL/hr at 02/15/12 1124    Past Medical History  Diagnosis Date  . Anxiety   . Depression   . Diabetes mellitus   . Hyperlipidemia   . Hypertension   . Dementia     Past Surgical History  Procedure Laterality Date  . Thyroidectomy      in 2000    Maureen Chatters, RD, LDN Pager #: [phone] After-Hours Pager #: [phone]

## 2012-02-15 NOTE — Progress Notes (Signed)
Utilization review completed.  

## 2012-02-15 NOTE — Progress Notes (Signed)
Patient ID: [name]  female  NWG:[id number]    DOB: 1940/04/16    DOA: 02/14/2012  PCP: Cala Bradford, MD  Assessment/Plan: Principal Problem: Acute on chronic blood loss anemia GIB (gastrointestinal bleeding) - cont H/H, clear liquid diet, planned endoscopy and colonoscopy tomorrow, GI consult - Hold aspirin and Fosamax  Active Problems:   Diabetes mellitus out of control - Cont sliding scale insulin    HYPERLIPIDEMIA    Dementia, Alzheimer's, with behavior disturbance with ANXIETY   DEPRESSION - Continue Haldol, monitor closely    HYPERTENSION stable  DVT Prophylaxis: SCDs  Code Status: Full code  Disposition: Pending EGD and colonoscopy tomorrow   Subjective: Patient has some dementia, states had 1 episode of bleeding a month ago, daughter at bed side   Objective: Weight change:   Intake/Output Summary (Last 24 hours) at 02/15/12 1413 Last data filed at 02/15/12 1200  Gross per 24 hour  Intake 1054.17 ml  Output    500 ml  Net 554.17 ml   Blood pressure 106/67, pulse 69, temperature 98 F (36.7 C), temperature source Oral, resp. rate 18, height 5\' 1"  (1.549 m), weight 54.46 kg (120 lb 1 oz), SpO2 100.00%.  Physical Exam: General: Alert and awake, oriented x2, not in any acute distress. HEENT: anicteric sclera, pupils reactive to light and accommodation, EOMI CVS: S1-S2 clear, no murmur rubs or gallops Chest: clear to auscultation bilaterally, no wheezing, rales or rhonchi Abdomen: soft nontender, nondistended, normal bowel sounds, no organomegaly Extremities: no cyanosis, clubbing or edema noted bilaterally   Lab Results: Basic Metabolic Panel:  Recent Labs Lab 02/14/12 1845  NA 140  K 4.3  CL 104  GLUCOSE 233*  BUN 21  CREATININE 0.90   Liver Function Tests: No results found for this basename: AST, ALT, ALKPHOS, BILITOT, PROT, ALBUMIN,  in the last 168 hours No results found for this basename: LIPASE, AMYLASE,  in the last 168 hours No  results found for this basename: AMMONIA,  in the last 168 hours CBC:  Recent Labs Lab 02/14/12 1813  02/15/12 0550 02/15/12 1256  WBC 7.9  --  7.9 7.0  NEUTROABS 3.3  --   --   --   HGB 6.3*  < > 7.6* 7.5*  HCT 21.7*  < > 25.1* 24.9*  MCV 63.5*  --  65.4* 65.0*  PLT 271  --  263 254  < > = values in this interval not displayed. Cardiac Enzymes: No results found for this basename: CKTOTAL, CKMB, CKMBINDEX, TROPONINI,  in the last 168 hours BNP: No components found with this basename: POCBNP,  CBG:  Recent Labs Lab 02/14/12 2156 02/15/12 0735 02/15/12 1219  GLUCAP 197* 187* 186*     Micro Results: No results found for this or any previous visit (from the past 240 hour(s)).  Studies/Results: No results found.  Medications: Scheduled Meds: . amLODipine  5 mg Oral Daily  . calcium-vitamin D  1 tablet Oral Daily  . docusate sodium  100 mg Oral BID  . donepezil  5 mg Oral QHS  . haloperidol  0.5 mg Oral QHS  . hydrochlorothiazide  25 mg Oral Daily  . insulin aspart  0-9 Units Subcutaneous TID WC  . mirtazapine  15 mg Oral QHS  . pantoprazole (PROTONIX) IV  40 mg Intravenous Q12H  . polyethylene glycol-electrolytes  4,000 mL Oral Once  . senna  1 tablet Oral BID      LOS: 1 day   Donja Tipping M.D. [facility] 02/15/2012, 2:13 PM Pager: [phone]  If 7PM-7AM, please contact night-coverage [email or web address] Password [email or web address]

## 2012-02-16 ENCOUNTER — Encounter (HOSPITAL_COMMUNITY): Payer: Self-pay | Admitting: *Deleted

## 2012-02-16 ENCOUNTER — Inpatient Hospital Stay (HOSPITAL_COMMUNITY): Payer: Medicare Other

## 2012-02-16 ENCOUNTER — Encounter (HOSPITAL_COMMUNITY)
Admission: EM | Disposition: A | Discharge status: Home / Self Care | Payer: Self-pay | Source: Home / Self Care | Attending: Internal Medicine

## 2012-02-16 DIAGNOSIS — F3289 Other specified depressive episodes: Secondary | ICD-10-CM

## 2012-02-16 DIAGNOSIS — F329 Major depressive disorder, single episode, unspecified: Secondary | ICD-10-CM

## 2012-02-16 DIAGNOSIS — IMO0001 Reserved for inherently not codable concepts without codable children: Secondary | ICD-10-CM

## 2012-02-16 HISTORY — PX: COLONOSCOPY: SHX5424

## 2012-02-16 HISTORY — PX: ESOPHAGOGASTRODUODENOSCOPY: SHX5428

## 2012-02-16 LAB — CBC
HCT: 24 % — ABNORMAL LOW (ref 36.0–46.0)
HCT: 23.7 % — ABNORMAL LOW (ref 36.0–46.0)
Hemoglobin: 7.1 g/dL — ABNORMAL LOW (ref 12.0–15.0)
MCV: 65.9 fL — ABNORMAL LOW (ref 78.0–100.0)
MCV: 66 fL — ABNORMAL LOW (ref 78.0–100.0)
Platelets: 258 10*3/uL (ref 150–400)
RBC: 3.64 MIL/uL — ABNORMAL LOW (ref 3.87–5.11)
RDW: 20.2 % — ABNORMAL HIGH (ref 11.5–15.5)
WBC: 7.7 10*3/uL (ref 4.0–10.5)
WBC: 5.4 10*3/uL (ref 4.0–10.5)

## 2012-02-16 LAB — GLUCOSE, CAPILLARY
Glucose-Capillary: 147 mg/dL — ABNORMAL HIGH (ref 70–99)
Glucose-Capillary: 127 mg/dL — ABNORMAL HIGH (ref 70–99)

## 2012-02-16 SURGERY — EGD (ESOPHAGOGASTRODUODENOSCOPY)
Anesthesia: Moderate Sedation

## 2012-02-16 MED ORDER — MIDAZOLAM HCL 10 MG/2ML IJ SOLN
INTRAMUSCULAR | Status: DC | PRN
  Administered 2012-02-16 (×3): 1 mg via INTRAVENOUS
  Administered 2012-02-16: 2 mg via INTRAVENOUS

## 2012-02-16 MED ORDER — MIDAZOLAM HCL 5 MG/ML IJ SOLN
INTRAMUSCULAR | Status: AC
  Filled 2012-02-16: qty 2

## 2012-02-16 MED ORDER — FENTANYL CITRATE 0.05 MG/ML IJ SOLN
INTRAMUSCULAR | Status: AC
  Filled 2012-02-16: qty 2

## 2012-02-16 MED ORDER — BUTAMBEN-TETRACAINE-BENZOCAINE 2-2-14 % EX AERO
INHALATION_SPRAY | CUTANEOUS | Status: DC | PRN
  Administered 2012-02-16: 2 via TOPICAL

## 2012-02-16 MED ORDER — FENTANYL CITRATE 0.05 MG/ML IJ SOLN
INTRAMUSCULAR | Status: DC | PRN
  Administered 2012-02-16 (×3): 25 ug via INTRAVENOUS

## 2012-02-16 MED ORDER — DIPHENHYDRAMINE HCL 50 MG/ML IJ SOLN
INTRAMUSCULAR | Status: AC
  Filled 2012-02-16: qty 1

## 2012-02-16 MED ORDER — SODIUM CHLORIDE 0.9 % IV SOLN
25.0000 mg | Freq: Once | INTRAVENOUS | Status: AC
  Administered 2012-02-16: 25 mg via INTRAVENOUS
  Filled 2012-02-16: qty 2

## 2012-02-16 NOTE — Interval H&P Note (Signed)
History and Physical Interval Note:  02/16/2012 9:59 AM  [name]  has presented today for surgery, with the diagnosis of gi bleed, anemia  The various methods of treatment have been discussed with the patient and family. After consideration of risks, benefits and other options for treatment, the patient has consented to  Procedure(s): ESOPHAGOGASTRODUODENOSCOPY (EGD) (N/A) COLONOSCOPY (N/A) as a surgical intervention .  The patient's history has been reviewed, patient examined, no change in status, stable for surgery.  I have reviewed the patient's chart and labs.  Questions were answered to the patient's satisfaction.     Lavarius Doughten JR,Tonilynn Bieker L

## 2012-02-16 NOTE — Op Note (Signed)
[facility] [address] [place], [address]   ENDOSCOPY PROCEDURE REPORT  PATIENT: [name], [name]  MR#: [id number] BIRTHDATE: October 28, 1940 , 71  yrs. old GENDER: Female ENDOSCOPIST:Cynthya Yam Randa Evens, MD REFERRED BY:  [facility]. PCP: Dr. Cliffton Asters PROCEDURE DATE:  02/16/2012 PROCEDURE:      EGD with biopsy ASA CLASS:     class 2 INDICATIONS:   profound iron deficiency anemia MEDICATION:    fentanyl 25 mcg,  Versed 3 mg IV TOPICAL ANESTHETIC:   cetacaine spray  DESCRIPTION OF PROCEDURE: procedure had been discussed with the patient and consent obtained. In the left lateral decubitus position the Pentax adult endoscope was inserted blindly into the esophagus. There was a small hiatal hernia. The esophagus was otherwise completely normal. No Berrett's, esophagitis were noted. The scope is passed into the stomach. The stomach was examined in the forward and retroflex view. no ulcers for active inflammation were seen. The antrum was normal. The scope was passed into to the duodenum. But the duodenal bulb and 2nd duodenum were normal. Random biopsies were obtained for celiac disease. The scope was withdrawn in the patient tolerated the procedure well.     COMPLICATIONS: None  ENDOSCOPIC IMPRESSION: 1. Profound iron deficiency anemia. No obvious source of bleeding seen on EGD  RECOMMENDATIONS: 1. We will go ahead and proceed with colonoscopy at this time    _______________________________ eSigned:  Carman Ching, MD 02/16/2012 11:18 AM

## 2012-02-16 NOTE — Progress Notes (Signed)
Patient ID: [name]  female  FAO:[id number]    DOB: 1940-04-13    DOA: 02/14/2012  PCP: Cala Bradford, MD  Assessment/Plan: Principal Problem: Acute on chronic blood loss anemia: Unclear etiology with severe microcytic anemia, iron deficiency - EGD and colonoscopy was done today and completely normal -  Hold aspirin and Fosamax - Discussed with patient's daughter on the phone, who had stated that patient was confused with possibly postmenopausal bleeding. I will obtain a pelvic and transvaginal ultrasound to rule out any uterine tumor, fibroids. - Give one dose of IV venofer today,will start oral iron replacement  Active Problems:   Diabetes mellitus: controlled - Cont sliding scale insulin    HYPERLIPIDEMIA    Dementia, Alzheimer's, with behavior disturbance with ANXIETY   DEPRESSION - Continue Haldol, monitor closely    HYPERTENSION stable  DVT Prophylaxis: SCDs  Code Status: Full code  Disposition: hopefully tomorrow   Subjective: Patient has some dementia, but alert and awake, seen after the endoscopy, states no specific complaints, no active bleeding.   Objective: Weight change:   Intake/Output Summary (Last 24 hours) at 02/16/12 1244 Last data filed at 02/16/12 1106  Gross per 24 hour  Intake    807 ml  Output    975 ml  Net   -168 ml   Blood pressure 115/55, pulse 63, temperature 98.1 F (36.7 C), temperature source Oral, resp. rate 16, height 5\' 1"  (1.549 m), weight 54.46 kg (120 lb 1 oz), SpO2 99.00%.  Physical Exam: General: Alert and awake, oriented x2, not in any acute distress. HEENT: anicteric sclera, pupils reactive to light and accommodation, EOMI CVS: S1-S2 clear, no murmur rubs or gallops Chest: ctab Abdomen: soft NT, NBS, NT  Extremities: no cyanosis, clubbing or edema noted bilaterally   Lab Results: Basic Metabolic Panel:  Recent Labs Lab 02/14/12 1845  NA 140  K 4.3  CL 104  GLUCOSE 233*  BUN 21  CREATININE 0.90   Liver  Function Tests: No results found for this basename: AST, ALT, ALKPHOS, BILITOT, PROT, ALBUMIN,  in the last 168 hours No results found for this basename: LIPASE, AMYLASE,  in the last 168 hours No results found for this basename: AMMONIA,  in the last 168 hours CBC:  Recent Labs Lab 02/14/12 1813  02/16/12 0607 02/16/12 1151  WBC 7.9  < > 7.7 5.4  NEUTROABS 3.3  --   --   --   HGB 6.3*  < > 7.0* 7.1*  HCT 21.7*  < > 24.0* 23.7*  MCV 63.5*  < > 65.9* 66.0*  PLT 271  < > 258 235  < > = values in this interval not displayed. Cardiac Enzymes: No results found for this basename: CKTOTAL, CKMB, CKMBINDEX, TROPONINI,  in the last 168 hours BNP: No components found with this basename: POCBNP,  CBG:  Recent Labs Lab 02/15/12 1219 02/15/12 1647 02/15/12 2015 02/16/12 0744 02/16/12 1148  GLUCAP 186* 126* 110* 147* 127*     Micro Results: No results found for this or any previous visit (from the past 240 hour(s)).  Studies/Results: No results found.  Medications: Scheduled Meds: . amLODipine  5 mg Oral Daily  . calcium-vitamin D  1 tablet Oral Daily  . docusate sodium  100 mg Oral BID  . donepezil  5 mg Oral QHS  . ferric gluconate (FERRLECIT/NULECIT) Test Dose  25 mg Intravenous Once  . haloperidol  0.5 mg Oral QHS  . hydrochlorothiazide  25 mg Oral Daily  .  insulin aspart  0-9 Units Subcutaneous TID WC  . mirtazapine  15 mg Oral QHS  . pantoprazole (PROTONIX) IV  40 mg Intravenous Q12H  . senna  1 tablet Oral BID      LOS: 2 days   Mayre Bury M.D. [facility] 02/16/2012, 12:44 PM Pager: [phone]  If 7PM-7AM, please contact night-coverage [email or web address] Password [email or web address]

## 2012-02-16 NOTE — Op Note (Signed)
[facility] [address] [place], [address]   COLONOSCOPY PROCEDURE REPORT  PATIENT: Michaela, [name]  MR#: [id number] BIRTHDATE: 08-22-1940 , 71  yrs. old GENDER: Female ENDOSCOPIST: Carman Ching, MD REFERRED BY:   [facility]. PCP: Dr. Cliffton Asters PROCEDURE DATE:  02/16/2012 PROCEDURE:    Colonoscopy ASA CLASS:    class III INDICATIONS:  profound iron deficiency anemia. EGD performed just before this procedure was normal MEDICATIONS:  fentanyl 75 mcg, versed 5 mg IV  DESCRIPTION OF PROCEDURE: following the EGD, a digital rectal exam was performed in the Pentax adult colonoscope was inserted. The prep was excellent we were able to reach the cecum using abdominal pressure and changing the patient's position. The penicillin orifice and ICV were identified. The scope was withdrawn in the clonic mucosa carefully examined. There were scattered diverticula in the left:. No tumor masses, polyps, or AVMs were seen. The scope was withdrawn in the patient tolerated the procedure well.     COMPLICATIONS: None  ENDOSCOPIC IMPRESSION: 1. Profound iron deficiency anemia. Colonoscopy and EGD essentially negative for source of G.I. blood loss. 2. Mild diverticulosis  RECOMMENDATIONS: we will go ahead and resume her diet. Would suggest we go ahead and give her IV iron and discharge on oral iron    _______________________________ Rosalie DoctorCarman Ching, MD 02/16/2012 11:29 AM

## 2012-02-17 DIAGNOSIS — K5900 Constipation, unspecified: Secondary | ICD-10-CM

## 2012-02-17 LAB — CBC
HCT: 24.9 % — ABNORMAL LOW (ref 36.0–46.0)
HCT: 24.7 % — ABNORMAL LOW (ref 36.0–46.0)
Hemoglobin: 7.4 g/dL — ABNORMAL LOW (ref 12.0–15.0)
MCH: 19.5 pg — ABNORMAL LOW (ref 26.0–34.0)
MCH: 19.6 pg — ABNORMAL LOW (ref 26.0–34.0)
MCHC: 29.7 g/dL — ABNORMAL LOW (ref 30.0–36.0)
MCV: 65.5 fL — ABNORMAL LOW (ref 78.0–100.0)
MCV: 65.5 fL — ABNORMAL LOW (ref 78.0–100.0)
Platelets: 249 10*3/uL (ref 150–400)
RBC: 3.77 MIL/uL — ABNORMAL LOW (ref 3.87–5.11)
RDW: 20.2 % — ABNORMAL HIGH (ref 11.5–15.5)

## 2012-02-17 LAB — GLUCOSE, CAPILLARY
Glucose-Capillary: 186 mg/dL — ABNORMAL HIGH (ref 70–99)
Glucose-Capillary: 346 mg/dL — ABNORMAL HIGH (ref 70–99)

## 2012-02-17 MED ORDER — DSS 100 MG PO CAPS: 100.0000 mg | ORAL_CAPSULE | Freq: Two times a day (BID) | ORAL | Status: DC | PRN

## 2012-02-17 MED ORDER — FERROUS GLUCONATE 324 (38 FE) MG PO TABS
324.0000 mg | ORAL_TABLET | Freq: Two times a day (BID) | ORAL | Status: DC
  Administered 2012-02-17: 324 mg via ORAL
  Filled 2012-02-17 (×3): qty 1

## 2012-02-17 MED ORDER — CALCIUM CARBONATE-VITAMIN D 500-200 MG-UNIT PO TABS: 1.0000 | ORAL_TABLET | Freq: Every day | ORAL | Status: AC

## 2012-02-17 MED ORDER — FERROUS GLUCONATE 324 (38 FE) MG PO TABS: 324.0000 mg | ORAL_TABLET | Freq: Two times a day (BID) | ORAL | Status: DC

## 2012-02-17 NOTE — Progress Notes (Signed)
Patient was discharged home with daughter. Patient and daughter were given discharge information and prescriptions. Patient was told to call MD with questions and concerns. Patients daughter declined PT home health because patient goes to a adult day care during the week and is independent with ADLs. Patient was stable upon discharge.

## 2012-02-17 NOTE — Discharge Summary (Signed)
Physician Discharge Summary  Patient ID: [name] MRN: [id number] DOB/AGE: Aug 23, 1940 72 y.o.  Admit date: 02/14/2012 Discharge date: 02/17/2012  Primary Care Physician:  Cala Bradford, MD  Discharge Diagnoses:    . Diabetes mellitus  . HYPERLIPIDEMIA . Dementia, Alzheimer's, with behavior disturbance . ANXIETY . DEPRESSION . HYPERTENSION . Severe Iron deficiency Anemia, unclear source . generalized debility  Consults:  Gastroenterology, Dr Randa Evens  Discharge Medications:   Medication List    STOP taking these medications       aspirin EC 81 MG tablet      TAKE these medications       alendronate 70 MG tablet  Commonly known as:  FOSAMAX  Take 70 mg by mouth every 7 (seven) days. Take with a full glass of water on an empty stomach.     amLODipine 5 MG tablet  Commonly known as:  NORVASC  Take 1 tablet (5 mg total) by mouth daily.     calcium citrate-vitamin D 315-200 MG-UNIT per tablet  Commonly known as:  CITRACAL+D  Take 1 tablet by mouth daily.     calcium-vitamin D 500-200 MG-UNIT per tablet  Commonly known as:  OSCAL WITH D  Take 1 tablet by mouth daily.     donepezil 5 MG tablet  Commonly known as:  ARICEPT  Take 1 tablet (5 mg total) by mouth at bedtime.     DSS 100 MG Caps  Take 100 mg by mouth 2 (two) times daily as needed for constipation.     ferrous gluconate 324 MG tablet  Commonly known as:  FERGON  Take 1 tablet (324 mg total) by mouth 2 (two) times daily with a meal.     haloperidol 0.5 MG tablet  Commonly known as:  HALDOL  Take 0.5 mg by mouth at bedtime.     hydrochlorothiazide 25 MG tablet  Commonly known as:  HYDRODIURIL  Take 1 tablet (25 mg total) by mouth daily.     metFORMIN 1000 MG tablet  Commonly known as:  GLUCOPHAGE  Take 1,000 mg by mouth 2 (two) times daily with a meal.     mirtazapine 15 MG tablet  Commonly known as:  REMERON  Take 15 mg by mouth at bedtime.     omeprazole 40 MG capsule  Commonly known  as:  PRILOSEC  Take 1 capsule (40 mg total) by mouth daily.         Brief H and P: For complete details please refer to admission H and P, but in brief Patient sent to the ER by her primary physician after routine blood work showed severe anemia. Patient is a poor historian because she has dementia, but daughter is with her. The daughter mentioned that her mom had complained of bleeding from her bottom 2 months ago and has had several such episodes in last few months. The patient thought that she had started having periods again. Each episode would last 1-2 days. Patient has not experienced any shortness of breath, heart palpitations, chest pain or passing out. No other complaints today. She had never had blood transfusions in the past.   Hospital Course:   Acute on chronic blood loss anemia: Unclear etiology with severe microcytic anemia, iron deficiency. Gastroenterology was consulted and patient underwent endoscopy and colonoscopy on 02/16/2012 which was completely normal. Fosamax was held in patient and aspirin was discontinued. I discussed with patient's daughter who had stated that patient was confused if she was having GI bleed or possibly  postmenopausal bleeding. Pelvic and transvaginal ultrasound was obtained which showed no uterine tumor or fibroids, normal endometrial thickness. Patient was given one dose of IV heparin and started on oral iron replacement. Patient may have out-patient GYN follow-up/referral if she has any repeat bleeding.    Diabetes mellitus: controlled   Dementia, Alzheimer's, with behavior disturbance with ANXIETY DEPRESSION: remained stable, continue Haldol at bed time.  HYPERTENSION stable   Day of Discharge BP 124/54  Pulse 62  Temp(Src) 98.7 F (37.1 C) (Oral)  Resp 16  Ht 5\' 1"  (1.549 m)  Wt 50.8 kg (111 lb 15.9 oz)  BMI 21.17 kg/m2  SpO2 100%  Physical Exam: General: Alert and awake not in any acute distress. HEENT: anicteric sclera, pupils  reactive to light and accommodation CVS: S1-S2 clear no murmur rubs or gallops Chest: clear to auscultation bilaterally, no wheezing rales or rhonchi Abdomen: soft nontender, nondistended, normal bowel sounds, no organomegaly Extremities: no cyanosis, clubbing or edema noted bilaterally Neuro: Cranial nerves II-XII intact, no focal neurological deficits   The results of significant diagnostics from this hospitalization (including imaging, microbiology, ancillary and laboratory) are listed below for reference.    LAB RESULTS: Basic Metabolic Panel:  Recent Labs Lab 02/14/12 1845  NA 140  K 4.3  CL 104  GLUCOSE 233*  BUN 21  CREATININE 0.90   CBC:  Recent Labs Lab 02/14/12 1813  02/16/12 1151 02/17/12 0440  WBC 7.9  < > 5.4 10.0  NEUTROABS 3.3  --   --   --   HGB 6.3*  < > 7.1* 7.4*  HCT 21.7*  < > 23.7* 24.9*  MCV 63.5*  < > 66.0* 65.5*  PLT 271  < > 235 260  < > = values in this interval not displayed. CBG:  Recent Labs Lab 02/16/12 2137 02/17/12 0814  GLUCAP 147* 186*    Significant Diagnostic Studies:  No results found.   Disposition and Follow-up:     Discharge Orders   Future Appointments Provider Department Dept Phone   02/20/2012 10:15 AM [facility] [place] [facility] [phone]   Future Orders Complete By Expires     Discharge instructions  As directed     Comments:      1) Discharge diet: low fat diet   2) Please have your Primary MD check CBC in 1 week, take the iron pills twice a day with meals.    Increase activity slowly  As directed         DISPOSITION: HOME with home PT  DIET: low fat diet  ACTIVITY: as tolerated  DISCHARGE FOLLOW-UP Follow-up Information   Follow up with Cala Bradford, MD. Schedule an appointment as soon as possible for a visit in 10 days. (check CBC at follow-up)    Contact information:   [address] [place] [address] [phone]       Time spent on  Discharge: 40 mins  Signed:   RAI,RIPUDEEP M.D. [facility] 02/17/2012, 11:29 AM Pager: [phone]

## 2012-02-17 NOTE — Evaluation (Signed)
Physical Therapy Evaluation Patient Details Name: [name] MRN: [id number] DOB: [birth year] Today's Date: 02/17/2012 Time: 1478-2956 PT Time Calculation (min): 29 min  PT Assessment / Plan / Recommendation Clinical Impression  72 y/o female admitted for acute on chronic blood loss anemia: Unclear etiology with severe microcytic anemia, iron deficiency. Presents to PT today with below impairments affecting safety and independence. Will benefit physical therapy in the acute setting to maximize safe mobility for d/c home. Pt at mim-moderate risk of falls with BERG score 40/56. Will need supervision for all mobility upon d/c home and this was discussed with nursing and patient's son. Don't feel a RW is necessary at this point as long as she has the appropriate supervision but she is right on the edge of needing one. Recommend HHPT to f/u on her balance training and strengthening as well as determining if RW is appropriate.     PT Assessment  Patient needs continued PT services    Follow Up Recommendations  Home health PT;Supervision/Assistance - 24 hour    Does the patient have the potential to tolerate intense rehabilitation      Barriers to Discharge        Equipment Recommendations  None recommended by PT    Recommendations for Other Services     Frequency Min 3X/week    Precautions / Restrictions Precautions Precautions: Fall Restrictions Weight Bearing Restrictions: No   Pertinent Vitals/Pain Denies pain      Mobility  Bed Mobility Bed Mobility: Not assessed Transfers Transfers: Sit to Stand;Stand to Sit Sit to Stand: 6: Modified independent (Device/Increase time);From chair/3-in-1;From bed;Without upper extremity assist;With upper extremity assist Stand to Sit: 6: Modified independent (Device/Increase time);To chair/3-in-1;To bed;Without upper extremity assist;With upper extremity assist Ambulation/Gait Ambulation/Gait Assistance: 5: Supervision;4: Min  guard Ambulation Distance (Feet): 250 Feet Assistive device: None Ambulation/Gait Assistance Details: supervision for straight path ambulation, occasionally veers off course and staggers needing mingaurdA for safety especially as she gets distracted Gait Pattern: Narrow base of support Gait velocity: decreased Stairs: No         PT Diagnosis: Abnormality of gait  PT Problem List: Decreased balance;Decreased cognition PT Treatment Interventions: DME instruction;Gait training;Functional mobility training;Therapeutic activities;Therapeutic exercise;Balance training;Neuromuscular re-education;Cognitive remediation;Patient/family education   PT Goals Acute Rehab PT Goals PT Goal Formulation: With patient Time For Goal Achievement: 02/24/12 Potential to Achieve Goals: Good Pt will Ambulate: >150 feet;with modified independence;with least restrictive assistive device PT Goal: Ambulate - Progress: Goal set today Additional Goals Additional Goal #1: Pt will demonstrate decreased risk of falls with BERG balance score 47/56.  PT Goal: Additional Goal #1 - Progress: Goal set today  Visit Information  Last PT Received On: 02/17/12 Assistance Needed: +1    Subjective Data  Subjective: I feel a little wobbly. Patient Stated Goal: home with daughter    Prior Functioning  Home Living Lives With: Daughter Available Help at Discharge: Family;Available 24 hours/day Type of Home: Apartment Home Access: Level entry Home Layout: One level Bathroom Shower/Tub: Tub/shower unit Home Adaptive Equipment: Shower chair without back Prior Function Level of Independence: Needs assistance Needs Assistance: Light Housekeeping;Meal Prep;Bathing;Dressing;Feeding;Toileting;Grooming Bath: Supervision/set-up Dressing: Supervision/set-up Feeding: Supervision/set-up Meal Prep: Total Light Housekeeping: Total Driving: No Vocation: 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 Overall Cognitive Status: History of cognitive impairments - at baseline Arousal/Alertness: Awake/alert Orientation Level: Appears intact for tasks assessed Behavior During Session: [facility] for tasks performed    Extremity/Trunk Assessment Right Upper Extremity Assessment RUE ROM/Strength/Tone: [facility]  for tasks assessed Left Upper Extremity Assessment LUE ROM/Strength/Tone: [facility] for tasks assessed Right Lower Extremity Assessment RLE ROM/Strength/Tone: [facility] for tasks assessed Left Lower Extremity Assessment LLE ROM/Strength/Tone: [facility] for tasks assessed Trunk Assessment Trunk Assessment: Normal   Balance Standardized Balance Assessment Standardized Balance Assessment: Berg Balance Test Berg Balance Test Sit to Stand: Able to stand without using hands and stabilize independently Standing Unsupported: Able to stand safely 2 minutes Sitting with Back Unsupported but Feet Supported on Floor or Stool: Able to sit safely and securely 2 minutes Stand to Sit: Sits safely with minimal use of hands Transfers: Able to transfer safely, minor use of hands Standing Unsupported with Eyes Closed: Able to stand 10 seconds with supervision Standing Ubsupported with Feet Together: Able to place feet together independently and stand for 1 minute with supervision From Standing, Reach Forward with Outstretched Arm: Can reach forward >12 cm safely (5") From Standing Position, Pick up Object from Floor: Able to pick up shoe, needs supervision From Standing Position, Turn to Look Behind Over each Shoulder: Looks behind one side only/other side shows less weight shift Turn 360 Degrees: Needs close supervision or verbal cueing Standing Unsupported, Alternately Place Feet on Step/Stool: Able to complete >2 steps/needs minimal assist Standing Unsupported, One Foot in Front: Able to take small step independently and hold 30 seconds Standing on One Leg: Tries to lift leg/unable to hold 3 seconds but remains  standing independently Total Score: 40  End of Session PT - End of Session Equipment Utilized During Treatment: Gait belt Activity Tolerance: Patient tolerated treatment well Patient left: in bed;with family/visitor present Nurse Communication: Mobility status;Other (comment) (PT recommendations)  GP     [facility] HELEN 02/17/2012, 9:24 AM

## 2012-02-17 NOTE — Care Management Note (Signed)
   CARE MANAGEMENT NOTE 02/17/2012  Patient:  [name]   Account Number:  [id number]  Date Initiated:  02/15/2012  Documentation initiated by:  Darlyne Russian  Subjective/Objective Assessment:   Patient admitted with lower GI bleed,  Hgb 6.0 in ED, given 1 unit PRBC.  PMH: lives at home with daughter has dementia.     Action/Plan:   Progression of care and discharge planning  Order for HHPT, spoke with pt and daughter, pt attends adult day care is not homebound and will not be at home for [facility]. Daughter does not think they pt needs further PT as she is walking now.   Anticipated DC Date:  02/17/2012   Anticipated DC Plan:           Choice offered to / List presented to:             Status of service:  Completed, signed off Medicare Important Message given?   (If response is "NO", the following Medicare IM given date fields will be blank) Date Medicare IM given:   Date Additional Medicare IM given:    Discharge Disposition:  HOME/SELF CARE  Per UR Regulation:  Reviewed for med. necessity/level of care/duration of stay  If discussed at Long Length of Stay Meetings, dates discussed:    Comments:  02/17/2012 Pt is active with adult daycare and will return to that on Monday, 02/20/2012 , pt daughter is caregiver and does not believe that HHPT is needed as pt is not homebound and is walking the hallway with min assistance. Johny Shock RN MPH [phone]

## 2012-02-18 ENCOUNTER — Encounter (HOSPITAL_COMMUNITY): Payer: Self-pay | Admitting: Gastroenterology

## 2012-02-20 ENCOUNTER — Ambulatory Visit
Admission: RE | Admit: 2012-02-20 | Discharge: 2012-02-20 | Disposition: A | Discharge status: Home / Self Care | Payer: Medicare Other | Source: Ambulatory Visit | Attending: Family Medicine | Admitting: Family Medicine

## 2012-02-20 DIAGNOSIS — Z9889 Other specified postprocedural states: Secondary | ICD-10-CM

## 2012-04-23 ENCOUNTER — Encounter: Payer: Self-pay | Admitting: *Deleted

## 2012-05-03 ENCOUNTER — Telehealth: Payer: Self-pay | Admitting: *Deleted

## 2012-05-03 NOTE — Telephone Encounter (Signed)
[facility] called for records - faxed to [employer], Harold Hedge, RN

## 2012-07-18 ENCOUNTER — Other Ambulatory Visit: Payer: Self-pay | Admitting: Family Medicine

## 2012-07-18 DIAGNOSIS — E042 Nontoxic multinodular goiter: Secondary | ICD-10-CM

## 2012-07-26 ENCOUNTER — Ambulatory Visit
Admission: RE | Admit: 2012-07-26 | Discharge: 2012-07-26 | Disposition: A | Discharge status: Home / Self Care | Payer: Medicare Other | Source: Ambulatory Visit | Attending: Family Medicine | Admitting: Family Medicine

## 2012-07-26 DIAGNOSIS — E042 Nontoxic multinodular goiter: Secondary | ICD-10-CM

## 2012-09-20 ENCOUNTER — Other Ambulatory Visit: Payer: Self-pay

## 2012-09-20 DIAGNOSIS — Z1231 Encounter for screening mammogram for malignant neoplasm of breast: Secondary | ICD-10-CM

## 2012-10-22 ENCOUNTER — Ambulatory Visit: Payer: Medicare Other

## 2012-12-07 ENCOUNTER — Ambulatory Visit: Payer: Medicare Other

## 2012-12-11 ENCOUNTER — Ambulatory Visit
Admission: RE | Admit: 2012-12-11 | Discharge: 2012-12-11 | Disposition: A | Discharge status: Home / Self Care | Payer: Medicare Other | Source: Ambulatory Visit

## 2012-12-11 DIAGNOSIS — Z1231 Encounter for screening mammogram for malignant neoplasm of breast: Secondary | ICD-10-CM

## 2013-09-25 ENCOUNTER — Ambulatory Visit: Payer: Self-pay | Admitting: Podiatry

## 2013-09-30 ENCOUNTER — Ambulatory Visit (INDEPENDENT_AMBULATORY_CARE_PROVIDER_SITE_OTHER): Payer: Medicare Other | Admitting: Podiatry

## 2013-09-30 ENCOUNTER — Encounter: Payer: Self-pay | Admitting: Podiatry

## 2013-09-30 VITALS — BP 147/88 | HR 80 | Resp 12

## 2013-09-30 DIAGNOSIS — M79676 Pain in unspecified toe(s): Secondary | ICD-10-CM

## 2013-09-30 DIAGNOSIS — M79609 Pain in unspecified limb: Secondary | ICD-10-CM

## 2013-09-30 DIAGNOSIS — B351 Tinea unguium: Secondary | ICD-10-CM

## 2013-09-30 NOTE — Patient Instructions (Signed)
Diabetes and Foot Care Diabetes may cause you to have problems because of poor blood supply (circulation) to your feet and legs. This may cause the skin on your feet to become thinner, break easier, and heal more slowly. Your skin may become dry, and the skin may peel and crack. You may also have nerve damage in your legs and feet causing decreased feeling in them. You may not notice minor injuries to your feet that could lead to infections or more serious problems. Taking care of your feet is one of the most important things you can do for yourself.  HOME CARE INSTRUCTIONS  Wear shoes at all times, even in the house. Do not go barefoot. Bare feet are easily injured.  Check your feet daily for blisters, cuts, and redness. If you cannot see the bottom of your feet, use a mirror or ask someone for help.  Wash your feet with warm water (do not use hot water) and mild soap. Then pat your feet and the areas between your toes until they are completely dry. Do not soak your feet as this can dry your skin.  Apply a moisturizing lotion or petroleum jelly (that does not contain alcohol and is unscented) to the skin on your feet and to dry, brittle toenails. Do not apply lotion between your toes.  Trim your toenails straight across. Do not dig under them or around the cuticle. File the edges of your nails with an emery board or nail file.  Do not cut corns or calluses or try to remove them with medicine.  Wear clean socks or stockings every day. Make sure they are not too tight. Do not wear knee-high stockings since they may decrease blood flow to your legs.  Wear shoes that fit properly and have enough cushioning. To break in new shoes, wear them for just a few hours a day. This prevents you from injuring your feet. Always look in your shoes before you put them on to be sure there are no objects inside.  Do not cross your legs. This may decrease the blood flow to your feet.  If you find a minor scrape,  cut, or break in the skin on your feet, keep it and the skin around it clean and dry. These areas may be cleansed with mild soap and water. Do not cleanse the area with peroxide, alcohol, or iodine.  When you remove an adhesive bandage, be sure not to damage the skin around it.  If you have a wound, look at it several times a day to make sure it is healing.  Do not use heating pads or hot water bottles. They may burn your skin. If you have lost feeling in your feet or legs, you may not know it is happening until it is too late.  Make sure your health care provider performs a complete foot exam at least annually or more often if you have foot problems. Report any cuts, sores, or bruises to your health care provider immediately. SEEK MEDICAL CARE IF:   You have an injury that is not healing.  You have cuts or breaks in the skin.  You have an ingrown nail.  You notice redness on your legs or feet.  You feel burning or tingling in your legs or feet.  You have pain or cramps in your legs and feet.  Your legs or feet are numb.  Your feet always feel cold. SEEK IMMEDIATE MEDICAL CARE IF:   There is increasing redness,   swelling, or pain in or around a wound.  There is a red line that goes up your leg.  Pus is coming from a wound.  You develop a fever or as directed by your health care provider.  You notice a bad smell coming from an ulcer or wound. Document Released: 12/18/1999 Document Revised: 08/22/2012 Document Reviewed: 05/29/2012 ExitCare Patient Information 2015 [employer]. This information is not intended to replace advice given to you by your health care provider. Make sure you discuss any questions you have with your health care provider.  

## 2013-09-30 NOTE — Progress Notes (Signed)
   Subjective:    Patient ID: [name], female    DOB: [birth year], 73 y.o.   MRN: [id number]  HPI TOENAILS TRIM AND CHECK DIABETIC FOOT B/L.   Review of Systems  Eyes: Positive for visual disturbance.  Respiratory: Positive for cough.   Genitourinary: Positive for urgency and frequency.  All other systems reviewed and are negative.  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

## 2013-10-01 ENCOUNTER — Encounter: Payer: Self-pay | Admitting: Podiatry

## 2013-11-19 IMAGING — CR DG CHEST 2V
2 series · 2 of 2 positions shown · non-contrast
Comparison: Chest x-ray of 04/25/2011

CLINICAL DATA: Cough, follow-up

CHEST - 2 VIEW

[view not recorded (1 of 2)]
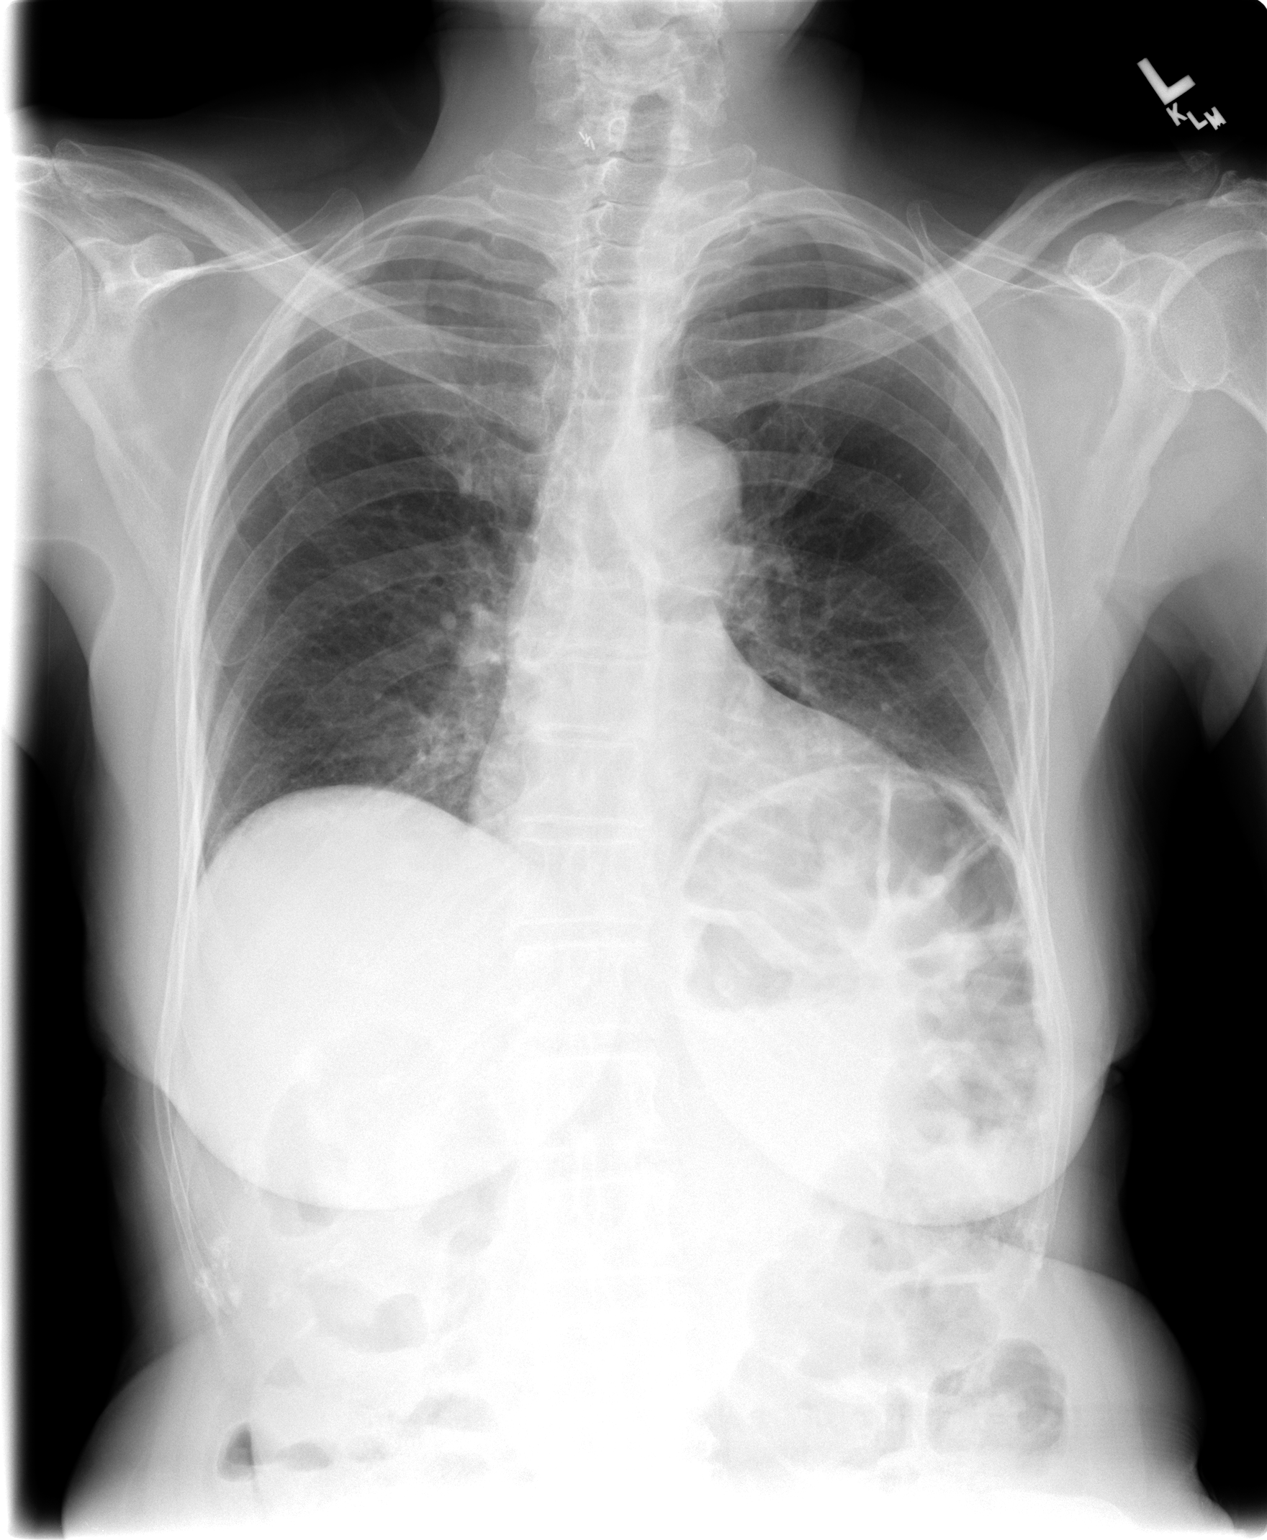

[view not recorded (2 of 2)]
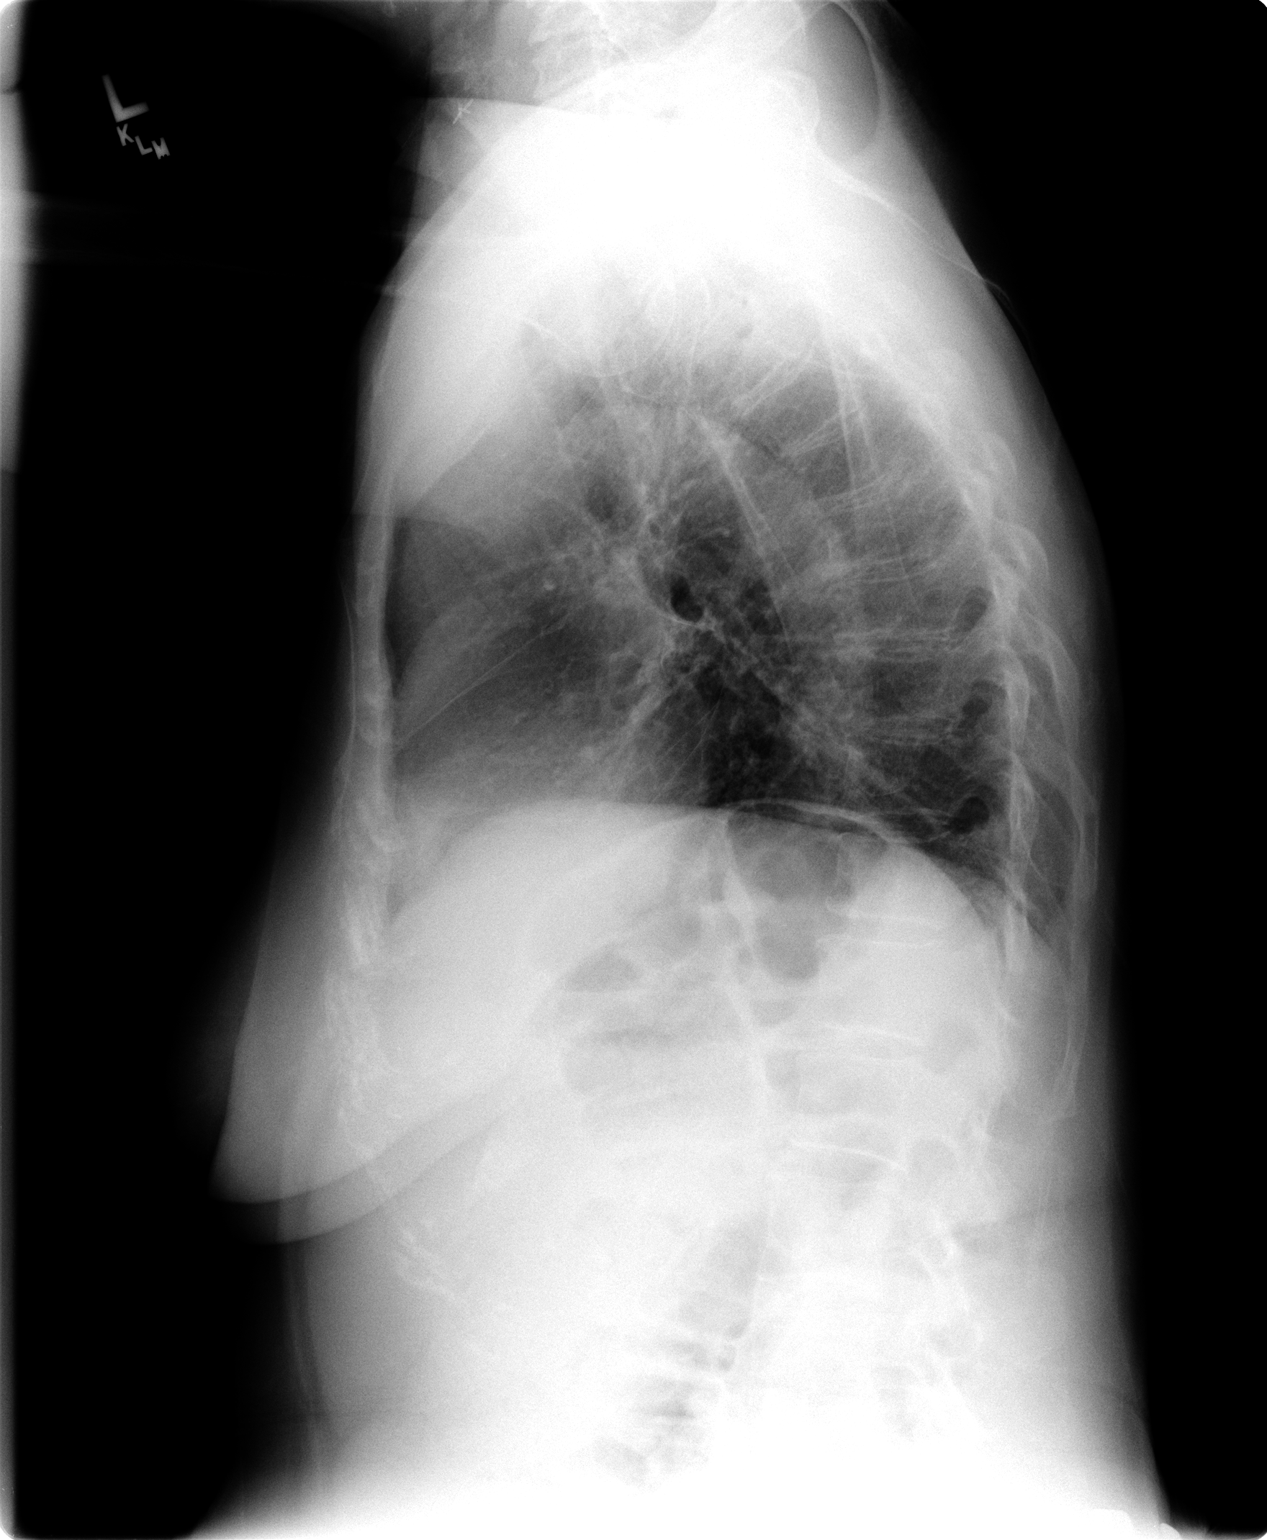

[2 of 2 positions shown; findings below may reference images not displayed]

FINDINGS: No active infiltrate or effusion is seen.  Mild left
basilar linear atelectasis is noted.  Cardiomegaly is stable.  No
bony abnormality is seen.
IMPRESSION: No active lung disease.  Minimal left basilar linear atelectasis.

## 2013-11-19 IMAGING — CR DG HUMERUS 2V *R*
3 series · 3 of 3 positions shown · non-contrast
Comparison: Chest x-ray of 04/25/2011

CLINICAL DATA: Questionable lytic lesion in the proximal humerus on
the lateral chest x-ray of

RIGHT HUMERUS - 2+ VIEW

[view not recorded (1 of 3)]
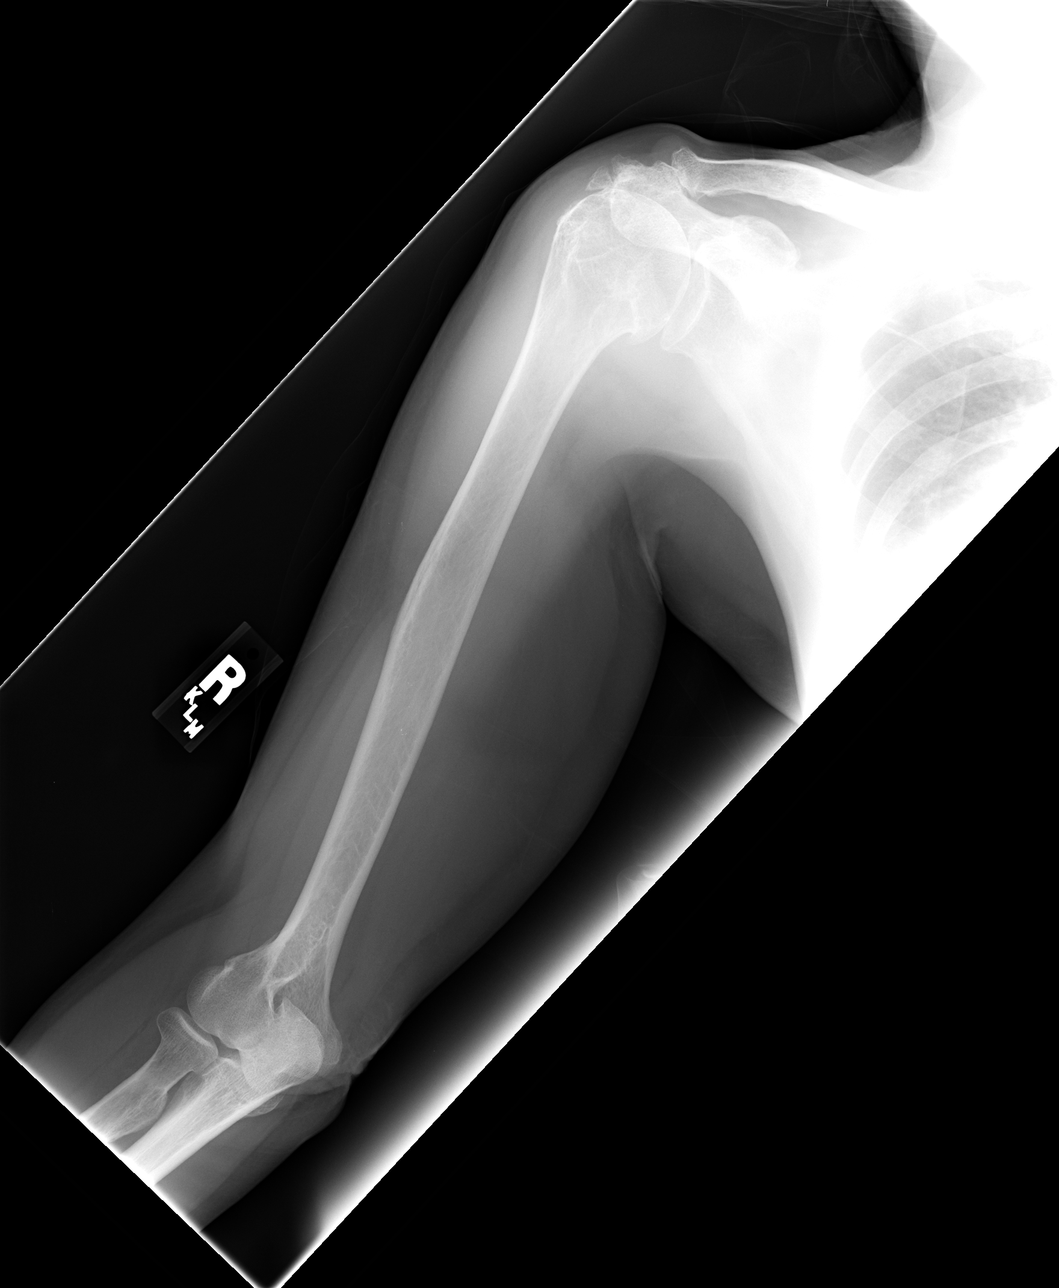

[view not recorded (2 of 3)]
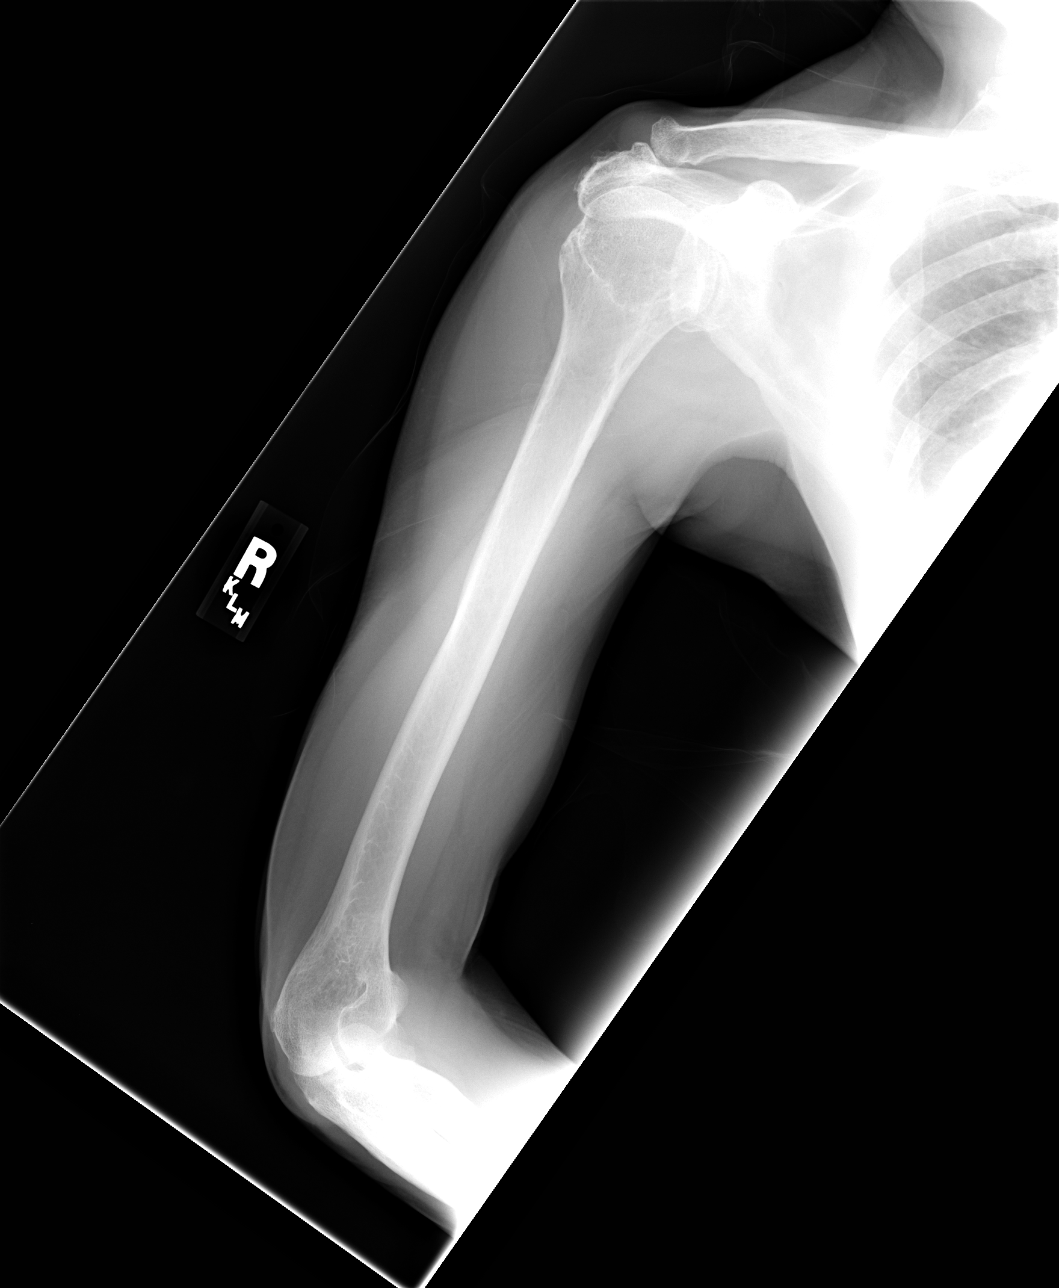

[view not recorded (3 of 3)]
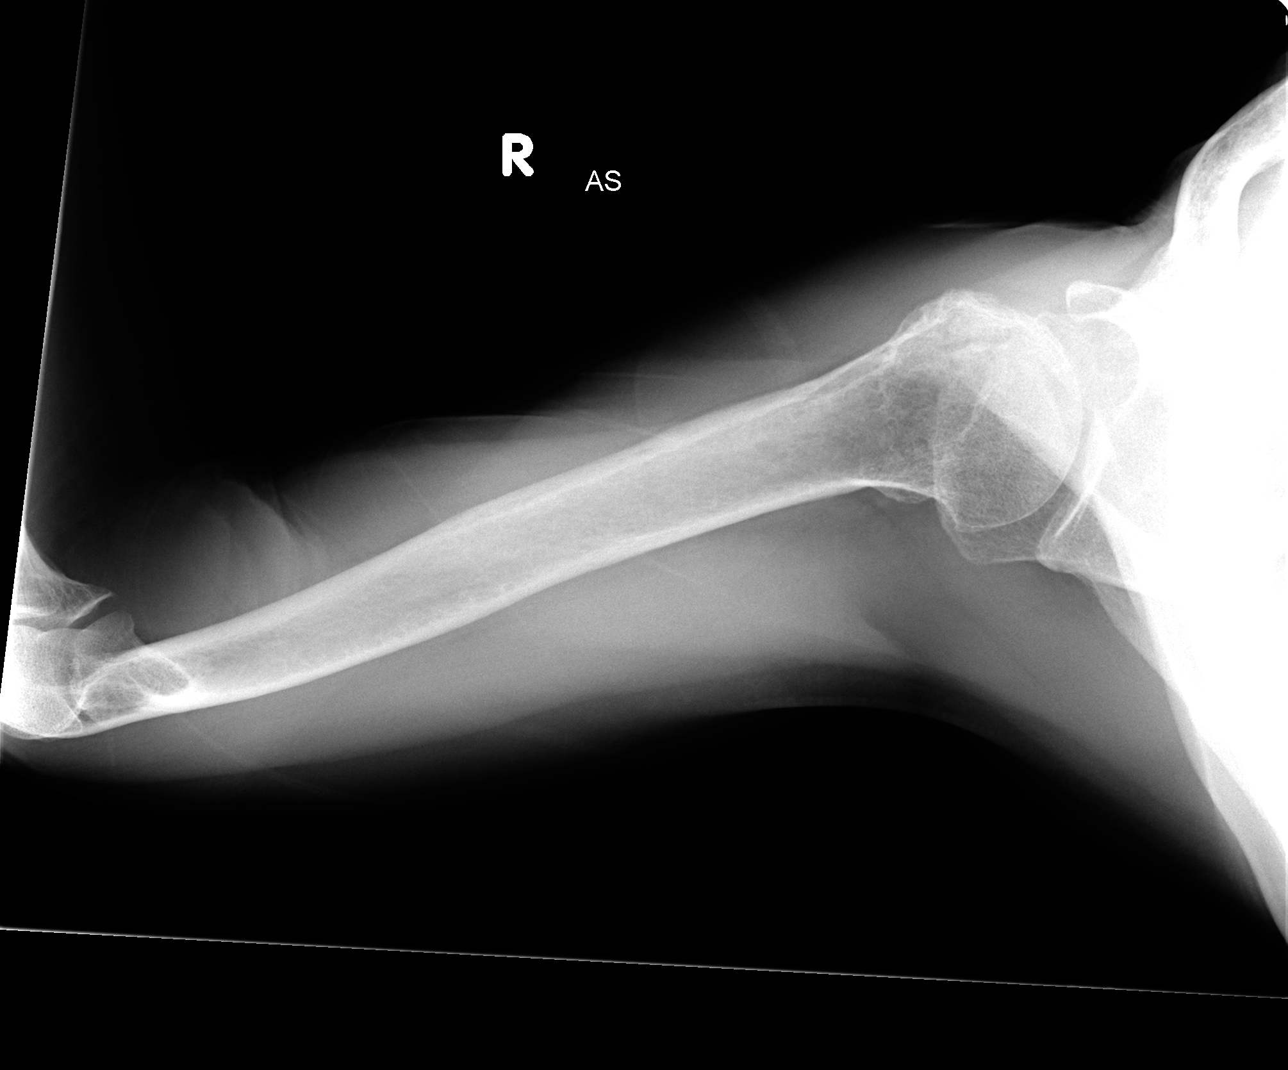

[3 of 3 positions shown; findings below may reference images not displayed]

FINDINGS: Initially two views of the right humerus were obtained.
There is degenerative change within the right shoulder with
considerable loss of subacromial space suggesting chronic rotator
cuff disease.  However, no lytic lesion is seen as was questioned
on prior lateral chest x-ray.  Therefore an axillary view was also
obtained and again no lytic lesion is seen.
IMPRESSION: No lytic lesion is identified.  There is significant degenerative
joint disease of the right shoulder.

## 2013-12-17 ENCOUNTER — Other Ambulatory Visit: Payer: Self-pay

## 2013-12-17 DIAGNOSIS — Z1231 Encounter for screening mammogram for malignant neoplasm of breast: Secondary | ICD-10-CM

## 2013-12-30 ENCOUNTER — Encounter: Payer: Self-pay | Admitting: Podiatry

## 2013-12-30 ENCOUNTER — Ambulatory Visit (INDEPENDENT_AMBULATORY_CARE_PROVIDER_SITE_OTHER): Payer: Medicare Other | Admitting: Podiatry

## 2013-12-30 DIAGNOSIS — B351 Tinea unguium: Secondary | ICD-10-CM

## 2013-12-30 DIAGNOSIS — M79676 Pain in unspecified toe(s): Secondary | ICD-10-CM

## 2013-12-30 NOTE — Progress Notes (Signed)
Patient ID: [name], female   DOB: 04/04/1940, 73 y.o.   MRN: [id number]  Subjective: This patient presents with with daughter complaining of painful toenails when walking wearing shoes  Objective: The toenails are elongated, incurvated, discolored and tender to palpation 6-10  Assessment: Symptomatic onychomycoses 6-10  Plan: Debrided toenails 10 without a bleeding  Reappoint 3 months

## 2014-01-13 ENCOUNTER — Ambulatory Visit
Admission: RE | Admit: 2014-01-13 | Discharge: 2014-01-13 | Disposition: A | Discharge status: Home / Self Care | Payer: Medicare Other | Source: Ambulatory Visit

## 2014-01-13 DIAGNOSIS — Z1231 Encounter for screening mammogram for malignant neoplasm of breast: Secondary | ICD-10-CM

## 2014-02-20 ENCOUNTER — Encounter: Payer: Self-pay | Admitting: *Deleted

## 2014-02-28 ENCOUNTER — Encounter: Payer: Self-pay | Admitting: Neurology

## 2014-02-28 ENCOUNTER — Ambulatory Visit (INDEPENDENT_AMBULATORY_CARE_PROVIDER_SITE_OTHER): Payer: Medicare Other | Admitting: Neurology

## 2014-02-28 VITALS — BP 118/64 | HR 66 | Temp 99.2°F | Resp 18 | Ht 64.0 in | Wt 129.0 lb

## 2014-02-28 DIAGNOSIS — F0281 Dementia in other diseases classified elsewhere with behavioral disturbance: Secondary | ICD-10-CM

## 2014-02-28 DIAGNOSIS — G308 Other Alzheimer's disease: Secondary | ICD-10-CM | POA: Diagnosis not present

## 2014-02-28 DIAGNOSIS — G309 Alzheimer's disease, unspecified: Principal | ICD-10-CM

## 2014-02-28 NOTE — Patient Instructions (Signed)
I do think Michaela DandyMary is in a more advanced stages of dementia.  I think the management is appropriate.  She should continue Aricept and Namenda.  She needs 24 hour supervision and goes to a daycare facility during the day where she engages in activities.  The only thing I would suggest would be to switch the Haldol to another medication for agitation, such as Seroquel.  Please call with any questions or concerns.

## 2014-02-28 NOTE — Progress Notes (Signed)
NEUROLOGY CONSULTATION NOTE  [name] MRN: [id number] DOB: [birth year]  Referring provider: Dr. Cliffton Asters Primary care provider: Dr. Cliffton Asters  Reason for consult:  Alzheimer's dementia  HISTORY OF PRESENT ILLNESS: [name] is a 74 year old right-handed woman with hypertension, diabetes mellitus, chronic hepatitis C, hyperlipidemia, depression and Alzheimer's disease who presents for assessment and opinion regarding Alzheimer's dementia.  Records, labs and CT of head reviewed.  She is accompanied by her daughter who provides most of the history.  She began exhibiting signs of dementia around 2004.  At that time, she began acting paranoid.  She would think that the neighbors were eavesdropping on her conversations.  This progressed to fear that people were trying to break into her apartment.  She exhibited short-term memory problems.  Minor forgetfulness such as misplacing her pocketbook has progressed to not easily recalling names or sometimes not recognizing her granddaughter.  While she was living by herself, her daughter would take her grocery shopping.  She would also make sure that her rent and bills per paid.  She would begin wandering around the apartment complex at night, knocking on neighbor's doors.  About 4 years ago, she moved in with her daughter.  On a couple of occasions, she tried to wander out at night, so her daughter keeps the doors locked with the alarm set.  She exhibits sundowning at night.  She becomes agitated.  She has made claims that she wants to go home, meaning her old apartment.  When she gets agitated, she takes Haldol 0.5mg  at bedtime, which is effective.  She is able to perform most of her activities of daily living, such as dressing and bathing.  She is able to use the toilet herself but does wear pull-ups.  Her daughter has to administer her medications.  She is able to fix herself a bowl of cereal but does not do any cooking. She says she does not feel depressed  at this time.  Her father had dementia.  She takes Aricept  daily and Namenda XR  daily.  She also takes mirtazapine  at bedtime for anxiety.  TSH from 01/15/14 was 0.86.  Electrolytes and LFTs unremarkable.  A CT of the head performed 04/26/05 showed chronic small vessel ischemic changes, but nothing remarkable.  PAST MEDICAL HISTORY: Past Medical History  Diagnosis Date  . Anxiety   . Depression   . Diabetes mellitus   . Hyperlipidemia   . Hypertension   . Dementia   . Hepatitis     PAST SURGICAL HISTORY: Past Surgical History  Procedure Laterality Date  . Thyroidectomy      in 2000  . Esophagogastroduodenoscopy N/A 02/16/2012    Procedure: ESOPHAGOGASTRODUODENOSCOPY (EGD);  Surgeon: Vertell Novak., MD;  Location: [facility] ENDOSCOPY;  Service: Endoscopy;  Laterality: N/A;  . Colonoscopy N/A 02/16/2012    Procedure: COLONOSCOPY;  Surgeon: Vertell Novak., MD;  Location[facility] ENDOSCOPY;  Service: Endoscopy;  Laterality: N/A;    MEDICATIONS: Current Outpatient Prescriptions on File Prior to Visit  Medication Sig Dispense Refill  . alendronate (FOSAMAX) 70 MG tablet Take 70 mg by mouth every 7 (seven) days. Take with a full glass of water on an empty stomach.    . calcium citrate-vitamin D (CITRACAL+D) 315-200 MG-UNIT per tablet Take 1 tablet by mouth daily.     . calcium-vitamin D (OSCAL WITH D) 500-200 MG-UNIT per tablet Take 1 tablet by mouth daily. 30 tablet 3  . docusate sodium 100 MG  CAPS Take 100 mg by mouth 2 (two) times daily as needed for constipation. 30 capsule 3  . donepezil (ARICEPT) 5 MG tablet Take 1 tablet (5 mg total) by mouth at bedtime. 30 tablet 6  . haloperidol (HALDOL) 0.5 MG tablet Take 0.5 mg by mouth at bedtime.    . JENTADUETO 2.05-998 MG TABS     . mirtazapine (REMERON) 15 MG tablet Take 15 mg by mouth at bedtime.    Marland Kitchen NAMENDA XR 28 MG CP24     . omeprazole (PRILOSEC) 40 MG capsule Take 1 capsule (40 mg total) by mouth daily. 30 capsule 6    . valsartan (DIOVAN) 160 MG tablet      No current facility-administered medications on file prior to visit.    ALLERGIES: No Known Allergies  FAMILY HISTORY: Family History  Problem Relation Age of Onset  . Cancer Sister     brain  . Diabetes Son   . Hypertension Son   . Diabetes Daughter     SOCIAL HISTORY: History   Social History  . Marital Status: Widowed    Spouse Name: N/A  . Number of Children: N/A  . Years of Education: 12   Occupational History  . Not on file.   Social History Main Topics  . Smoking status: Never Smoker   . Smokeless tobacco: Not on file  . Alcohol Use: No  . Drug Use: No  . Sexual Activity: No   Other Topics Concern  . Not on file   Social History Narrative   Lives with daughter and grand-daughter to help informally supervise pt at home.   Pt also in adult daycare during the week.      REVIEW OF SYSTEMS: Constitutional: No fevers, chills, or sweats, no generalized fatigue, change in appetite Eyes: No visual changes, double vision, eye pain Ear, nose and throat: No hearing loss, ear pain, nasal congestion, sore throat Cardiovascular: No chest pain, palpitations Respiratory:  No shortness of breath at rest or with exertion, wheezes GastrointestinaI: No nausea, vomiting, diarrhea, abdominal pain, fecal incontinence Genitourinary:  No dysuria, urinary retention or frequency Musculoskeletal:  No neck pain, back pain Integumentary: No rash, pruritus, skin lesions Neurological: as above Psychiatric: No depression, insomnia, anxiety Endocrine: No palpitations, fatigue, diaphoresis, mood swings, change in appetite, change in weight, increased thirst Hematologic/Lymphatic:  No anemia, purpura, petechiae. Allergic/Immunologic: no itchy/runny eyes, nasal congestion, recent allergic reactions, rashes  PHYSICAL EXAM: Filed Vitals:   02/28/14 1016  BP: 118/64  Pulse: 66  Temp: 99.2 F (37.3 C)  Resp: 18   General: No acute  distress Head:  Normocephalic/atraumatic Eyes:  fundi unremarkable, without vessel changes, exudates, hemorrhages or papilledema. Neck: supple, no paraspinal tenderness, full range of motion Back: No paraspinal tenderness Heart: regular rate and rhythm Lungs: Clear to auscultation bilaterally. Vascular: No carotid bruits. Neurological Exam: Mental status: alert and oriented to person, place, and time, recent memory poor, remote memory intact, fund of knowledge intact, attention and concentration impaired, speech fluent and not dysarthric, language intact. Montreal Cognitive Assessment  02/28/2014  Visuospatial/ Executive (0/5) 0  Naming (0/3) 2  Attention: Read list of digits (0/2) 1  Attention: Read list of letters (0/1) 0  Attention: Serial 7 subtraction starting at 100 (0/3) 0  Language: Repeat phrase (0/2) 2  Language : Fluency (0/1) 0  Abstraction (0/2) 1  Delayed Recall (0/5) 0  Orientation (0/6) 2  Total 8  Adjusted Score (based on education) 9   Cranial nerves: CN  I: not tested CN II: pupils equal, round and reactive to light, visual fields intact, fundi unremarkable, without vessel changes, exudates, hemorrhages or papilledema. CN III, IV, VI:  full range of motion, no nystagmus, no ptosis CN V: facial sensation intact CN VII: upper and lower face symmetric CN VIII: hearing intact CN IX, X: gag intact, uvula midline CN XI: sternocleidomastoid and trapezius muscles intact CN XII: tongue midline Bulk & Tone: normal, no fasciculations. Motor:  5/5 throughout Sensation:  Pinprick and vibration intact Deep Tendon Reflexes:  2+ throughout, toes downgoing Finger to nose testing:  No dysmetria Heel to shin:  No dysmetria Gait:  Normal station and stride.  Able to turn.  Unable to walk in tandem. Romberg negativ.  IMPRESSION: Alzheimer's dementia with behavioral symptoms.  I would say it is in the advanced stages.  She seems stable.  She is taking Aricept and Namenda.  She  is under 24 hour supervision and partakes in a daycare facility.  The only suggestion I have would be to switch Haldol to a second generation antipsychotic, such as Seroquel.  While all antipsychotics have a black box warning of increased risk of mortality, Haldol may have a slightly higher risk.  I discussed this with the daughter who is aware of the risks and benefits.  Since she has been on Haldol for 4 years and it is effective, she would like her mother to remain on Haldol for now.  Her daughter just wanted an opinion from a neurologist and does not seek continued care.  However, I told her that she may call with question or concerns or need for follow up.    Thank you for allowing me to take part in the care of this patient.  Shon MilletAdam Jaffe, DO  CC:  Laurann Montanaynthia White, MD

## 2014-03-31 ENCOUNTER — Encounter: Payer: Self-pay | Admitting: Podiatry

## 2014-03-31 ENCOUNTER — Ambulatory Visit (INDEPENDENT_AMBULATORY_CARE_PROVIDER_SITE_OTHER): Payer: Medicare Other | Admitting: Podiatry

## 2014-03-31 DIAGNOSIS — M79676 Pain in unspecified toe(s): Secondary | ICD-10-CM

## 2014-03-31 DIAGNOSIS — B351 Tinea unguium: Secondary | ICD-10-CM

## 2014-03-31 NOTE — Patient Instructions (Signed)
Diabetes and Foot Care Diabetes may cause you to have problems because of poor blood supply (circulation) to your feet and legs. This may cause the skin on your feet to become thinner, break easier, and heal more slowly. Your skin may become dry, and the skin may peel and crack. You may also have nerve damage in your legs and feet causing decreased feeling in them. You may not notice minor injuries to your feet that could lead to infections or more serious problems. Taking care of your feet is one of the most important things you can do for yourself.  HOME CARE INSTRUCTIONS  Wear shoes at all times, even in the house. Do not go barefoot. Bare feet are easily injured.  Check your feet daily for blisters, cuts, and redness. If you cannot see the bottom of your feet, use a mirror or ask someone for help.  Wash your feet with warm water (do not use hot water) and mild soap. Then pat your feet and the areas between your toes until they are completely dry. Do not soak your feet as this can dry your skin.  Apply a moisturizing lotion or petroleum jelly (that does not contain alcohol and is unscented) to the skin on your feet and to dry, brittle toenails. Do not apply lotion between your toes.  Trim your toenails straight across. Do not dig under them or around the cuticle. File the edges of your nails with an emery board or nail file.  Do not cut corns or calluses or try to remove them with medicine.  Wear clean socks or stockings every day. Make sure they are not too tight. Do not wear knee-high stockings since they may decrease blood flow to your legs.  Wear shoes that fit properly and have enough cushioning. To break in new shoes, wear them for just a few hours a day. This prevents you from injuring your feet. Always look in your shoes before you put them on to be sure there are no objects inside.  Do not cross your legs. This may decrease the blood flow to your feet.  If you find a minor scrape,  cut, or break in the skin on your feet, keep it and the skin around it clean and dry. These areas may be cleansed with mild soap and water. Do not cleanse the area with peroxide, alcohol, or iodine.  When you remove an adhesive bandage, be sure not to damage the skin around it.  If you have a wound, look at it several times a day to make sure it is healing.  Do not use heating pads or hot water bottles. They may burn your skin. If you have lost feeling in your feet or legs, you may not know it is happening until it is too late.  Make sure your health care provider performs a complete foot exam at least annually or more often if you have foot problems. Report any cuts, sores, or bruises to your health care provider immediately. SEEK MEDICAL CARE IF:   You have an injury that is not healing.  You have cuts or breaks in the skin.  You have an ingrown nail.  You notice redness on your legs or feet.  You feel burning or tingling in your legs or feet.  You have pain or cramps in your legs and feet.  Your legs or feet are numb.  Your feet always feel cold. SEEK IMMEDIATE MEDICAL CARE IF:   There is increasing redness,   swelling, or pain in or around a wound.  There is a red line that goes up your leg.  Pus is coming from a wound.  You develop a fever or as directed by your health care provider.  You notice a bad smell coming from an ulcer or wound. Document Released: 12/18/1999 Document Revised: 08/22/2012 Document Reviewed: 05/29/2012 ExitCare Patient Information 2015 [employer]. This information is not intended to replace advice given to you by your health care provider. Make sure you discuss any questions you have with your health care provider.  

## 2014-04-01 NOTE — Progress Notes (Signed)
Patient ID: Michaela BridgeMary H [name], female   DOB: 09-06-1940, 74 y.o.   MRN: [id number]  Subjective: This patient presents complaining of painful toenails and requesting nail debridement  Objective: The toenails are discolored, elongated, hypertrophic and tender to direct palpation 6-10  Assessment: Symptomatic onychomycoses 6-10 Diabetic  Plan: Debridement of toenails 10 without any bleeding  Reappoint 3 months

## 2014-07-02 ENCOUNTER — Ambulatory Visit: Payer: Medicare Other | Admitting: Podiatry

## 2014-08-22 ENCOUNTER — Ambulatory Visit (INDEPENDENT_AMBULATORY_CARE_PROVIDER_SITE_OTHER): Payer: Medicare Other

## 2014-08-22 ENCOUNTER — Ambulatory Visit (INDEPENDENT_AMBULATORY_CARE_PROVIDER_SITE_OTHER): Payer: Medicare Other | Admitting: Podiatry

## 2014-08-22 ENCOUNTER — Ambulatory Visit: Payer: Medicare Other | Admitting: Podiatry

## 2014-08-22 ENCOUNTER — Encounter: Payer: Self-pay | Admitting: Podiatry

## 2014-08-22 DIAGNOSIS — M79676 Pain in unspecified toe(s): Secondary | ICD-10-CM | POA: Diagnosis not present

## 2014-08-22 DIAGNOSIS — M205X9 Other deformities of toe(s) (acquired), unspecified foot: Secondary | ICD-10-CM | POA: Diagnosis not present

## 2014-08-22 DIAGNOSIS — R52 Pain, unspecified: Secondary | ICD-10-CM

## 2014-08-22 DIAGNOSIS — B351 Tinea unguium: Secondary | ICD-10-CM | POA: Diagnosis not present

## 2014-08-22 NOTE — Progress Notes (Signed)
Patient ID: [name], female   DOB: 1941/01/03, 74 y.o.   MRN: [id number]   Subjective: 74 year old female presents the office as a walk-in appointment for concerns of fifth toe pain which is been ongoing for approximate one week. The patient initially states that her left side and she was confused and was unsure what side was bothering her. The patient's daughter was concerned with her hand diabetic and have her feet checked. The patient does have dementia which makes didn't history difficult. She also says her nails are elongated, thick and she is unable to trim them herself in the causing irritation in shoes. No other complaints at this time. She denies any systemic complaints as fevers, chills, nausea, vomiting.  Objective: Awake, alert, NAD DP/PT pulses palpable, CRT less than 3 seconds Protective sensation appears to be slightly decreased with Kerby Moors monofilament, Achilles tendon reflex intact. Nails are hypertrophic, dystrophic, brittle, discolored, elongated 10. There is tenderness to palpation of her nails 1-5 bilaterally. There is no surrounding erythema, edema, drainage. At this time there is no open lesions or pre-ulcerative lesions. There is no pinpoint bony tenderness or pain the vibratory sensation. There is no overlying edema, erythema, increase in warmth to bilateral lower extremity. 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 Fifth toes appear to be identical and sizing color. There is no clinical signs of infection at this time. No pain with calf compression, swelling, warmth, erythema.  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 Currently there is no open lesions or signs of infection.  Plan: -Treatment options discussed including all alternatives, risks, and complications -Nail sharply debrided  10 without complications/bleeding. -Dispensed offloading pads for the fifth toes. Continue to monitor for any skin breakdown. Discussed with her call the office immediately should any occur or any changes.  -Follow-up 3 months or sooner if any problems arise. In the meantime, encouraged to call the office with any questions, concerns, change in symptoms.   Ovid Curd, DPM

## 2014-11-24 ENCOUNTER — Ambulatory Visit (INDEPENDENT_AMBULATORY_CARE_PROVIDER_SITE_OTHER): Payer: Medicare Other | Admitting: Podiatry

## 2014-11-24 ENCOUNTER — Encounter: Payer: Self-pay | Admitting: Podiatry

## 2014-11-24 DIAGNOSIS — M79676 Pain in unspecified toe(s): Secondary | ICD-10-CM

## 2014-11-24 DIAGNOSIS — B351 Tinea unguium: Secondary | ICD-10-CM

## 2014-11-24 NOTE — Progress Notes (Signed)
Patient ID: [name], female   DOB: [birth year], 74 y.o.   MRN: [id number]  Subjective: 74 y.o. returns the office today for painful, elongated, thickened toenails which they cannot trim themself. Denies any redness or drainage around the nails. Denies any acute changes since last appointment and no new complaints today. Denies any systemic complaints such as fevers, chills, nausea, vomiting.   Objective: Awake, alert, NAD DP/PT pulses palpable, CRT less than 3 seconds Nails hypertrophic, dystrophic, elongated, brittle, discolored 10. There is tenderness overlying the nails 1-5 bilaterally. There is no surrounding erythema or drainage along the nail sites. No open lesions or pre-ulcerative lesions are identified. No other areas of tenderness bilateral lower extremities. No overlying edema, erythema, increased warmth. No pain with calf compression, swelling, warmth, erythema.  Assessment: Patient presents with symptomatic onychomycosis  Plan: -Treatment options including alternatives, risks, complications were discussed -Nails sharply debrided 10 without complication/bleeding. -Discussed daily foot inspection. If there are any changes, to call the office immediately.  -Follow-up in 3 months or sooner if any problems are to arise. In the meantime, encouraged to call the office with any questions, concerns, changes symptoms.  Ovid CurdMatthew Wagoner, DPM

## 2015-07-21 ENCOUNTER — Other Ambulatory Visit: Payer: Self-pay | Admitting: Family Medicine

## 2015-07-21 DIAGNOSIS — Z1231 Encounter for screening mammogram for malignant neoplasm of breast: Secondary | ICD-10-CM

## 2015-07-28 ENCOUNTER — Ambulatory Visit
Admission: RE | Admit: 2015-07-28 | Discharge: 2015-07-28 | Disposition: A | Discharge status: Home / Self Care | Payer: Medicare Other | Source: Ambulatory Visit | Attending: Family Medicine | Admitting: Family Medicine

## 2015-07-28 DIAGNOSIS — Z1231 Encounter for screening mammogram for malignant neoplasm of breast: Secondary | ICD-10-CM

## 2015-09-25 ENCOUNTER — Ambulatory Visit: Payer: Medicare Other | Admitting: Podiatry

## 2015-11-25 ENCOUNTER — Emergency Department (HOSPITAL_COMMUNITY)
Admission: EM | Admit: 2015-11-25 | Discharge: 2015-11-25 | Disposition: A | Discharge status: Home / Self Care | Payer: Medicare Other | Attending: Physician Assistant | Admitting: Physician Assistant

## 2015-11-25 ENCOUNTER — Encounter (HOSPITAL_COMMUNITY): Payer: Self-pay | Admitting: Emergency Medicine

## 2015-11-25 DIAGNOSIS — Z7984 Long term (current) use of oral hypoglycemic drugs: Secondary | ICD-10-CM | POA: Insufficient documentation

## 2015-11-25 DIAGNOSIS — I10 Essential (primary) hypertension: Secondary | ICD-10-CM | POA: Diagnosis not present

## 2015-11-25 DIAGNOSIS — E1165 Type 2 diabetes mellitus with hyperglycemia: Secondary | ICD-10-CM | POA: Diagnosis present

## 2015-11-25 DIAGNOSIS — R739 Hyperglycemia, unspecified: Secondary | ICD-10-CM

## 2015-11-25 LAB — BASIC METABOLIC PANEL
Anion gap: 13 (ref 5–15)
Anion gap: 12 (ref 5–15)
BUN: 29 mg/dL — ABNORMAL HIGH (ref 6–20)
BUN: 29 mg/dL — ABNORMAL HIGH (ref 6–20)
CHLORIDE: 103 mmol/L (ref 101–111)
CO2: 25 mmol/L (ref 22–32)
CO2: 24 mmol/L (ref 22–32)
CREATININE: 1.38 mg/dL — AB (ref 0.44–1.00)
Calcium: 10.3 mg/dL (ref 8.9–10.3)
Calcium: 10 mg/dL (ref 8.9–10.3)
Chloride: 104 mmol/L (ref 101–111)
Creatinine, Ser: 1.37 mg/dL — ABNORMAL HIGH (ref 0.44–1.00)
GFR calc Af Amer: 42 mL/min — ABNORMAL LOW (ref >60)
GFR calc non Af Amer: 36 mL/min — ABNORMAL LOW (ref >60)
GFR, EST AFRICAN AMERICAN: 42 mL/min — AB (ref >60)
GFR, EST NON AFRICAN AMERICAN: 36 mL/min — AB (ref >60)
Glucose, Bld: 539 mg/dL (ref 65–99)
Glucose, Bld: 490 mg/dL — ABNORMAL HIGH (ref 65–99)
POTASSIUM: 4.4 mmol/L (ref 3.5–5.1)
Potassium: 4.5 mmol/L (ref 3.5–5.1)
SODIUM: 141 mmol/L (ref 135–145)
Sodium: 140 mmol/L (ref 135–145)

## 2015-11-25 LAB — CBC WITH DIFFERENTIAL/PLATELET
Basophils Absolute: 0 10*3/uL (ref 0.0–0.1)
Basophils Relative: 0 %
Eosinophils Absolute: 0.1 10*3/uL (ref 0.0–0.7)
Eosinophils Relative: 1 %
HCT: 41.9 % (ref 36.0–46.0)
Hemoglobin: 13.6 g/dL (ref 12.0–15.0)
Lymphocytes Relative: 25 %
Lymphs Abs: 3.3 10*3/uL (ref 0.7–4.0)
MCH: 31.1 pg (ref 26.0–34.0)
MCHC: 32.5 g/dL (ref 30.0–36.0)
MCV: 95.9 fL (ref 78.0–100.0)
Monocytes Absolute: 0.7 10*3/uL (ref 0.1–1.0)
Monocytes Relative: 5 %
Neutro Abs: 9.2 10*3/uL — ABNORMAL HIGH (ref 1.7–7.7)
Neutrophils Relative %: 69 %
Platelets: 209 10*3/uL (ref 150–400)
RBC: 4.37 MIL/uL (ref 3.87–5.11)
RDW: 13 % (ref 11.5–15.5)
WBC: 13.3 10*3/uL — ABNORMAL HIGH (ref 4.0–10.5)

## 2015-11-25 LAB — CBG MONITORING, ED
GLUCOSE-CAPILLARY: 462 mg/dL — AB (ref 65–99)
GLUCOSE-CAPILLARY: 283 mg/dL — AB (ref 65–99)
Glucose-Capillary: 459 mg/dL — ABNORMAL HIGH (ref 65–99)
Glucose-Capillary: 317 mg/dL — ABNORMAL HIGH (ref 65–99)

## 2015-11-25 LAB — CBC
HCT: 43.5 % (ref 36.0–46.0)
Hemoglobin: 14.5 g/dL (ref 12.0–15.0)
MCH: 32 pg (ref 26.0–34.0)
MCHC: 33.3 g/dL (ref 30.0–36.0)
MCV: 96 fL (ref 78.0–100.0)
PLATELETS: 224 10*3/uL (ref 150–400)
RBC: 4.53 MIL/uL (ref 3.87–5.11)
RDW: 13 % (ref 11.5–15.5)
WBC: 13.4 10*3/uL — AB (ref 4.0–10.5)

## 2015-11-25 MED ORDER — INSULIN ASPART 100 UNIT/ML ~~LOC~~ SOLN: 6.0000 [IU] | Freq: Once | SUBCUTANEOUS | Status: DC

## 2015-11-25 MED ORDER — SODIUM CHLORIDE 0.9 % IV BOLUS (SEPSIS)
1000.0000 mL | Freq: Once | INTRAVENOUS | Status: AC
  Administered 2015-11-25: 1000 mL via INTRAVENOUS

## 2015-11-25 MED ORDER — INSULIN ASPART 100 UNIT/ML ~~LOC~~ SOLN
8.0000 [IU] | Freq: Once | SUBCUTANEOUS | Status: AC
  Administered 2015-11-25: 8 [IU] via INTRAVENOUS
  Filled 2015-11-25: qty 1

## 2015-11-25 NOTE — ED Notes (Signed)
[id number] 

## 2015-11-25 NOTE — ED Provider Notes (Signed)
[facility]-EMERGENCY DEPT Provider Note   CSN: [id number] Arrival date & time: 11/25/15  1340     History   Chief Complaint Chief Complaint  Patient presents with  . Hyperglycemia    HPI [name] is a 75 y.o. female.  HPI  75 year old female presents today with complaints of hyperglycemia. Patient has a history of Alzheimer's disease, daughters at bedside who provides the majority of the pertinent information. She reports patient has been acting appropriate per her baseline, notes that this morning she did seem "different", but could not quite put her finger on it. She notes that she was at adult daycare center today when they checked her blood sugar, this was noted to be in the 500s. [name] brings her for evaluation of hyperglycemia. She notes that she is a type II diabetic currently on metformin, not taking any insulin. She reports her diet is locally cemented, but notes that people at the daycare center often give her candy. She denies any infectious etiology, she denies any neurological complaints.  Past Medical History:  Diagnosis Date  . Anxiety   . Dementia   . Depression   . Diabetes mellitus   . Hepatitis   . Hyperlipidemia   . Hypertension     Patient Active Problem List   Diagnosis Date Noted  . Anemia 02/14/2012  . GI Bleed 02/14/2012  . Blepharitis of left upper eyelid 04/25/2011  . Cough 12/30/2010  . Right ear pain 11/20/2010  . Urinary frequency 11/20/2010  . Pain in gums 06/07/2010  . Systolic murmur 03/18/2010  . Right thigh pain 03/18/2010  . Urge incontinence 03/18/2010  . Dementia, Alzheimer's, with behavior disturbance 09/30/2009  . OSTEOPOROSIS 09/21/2009  . CONSTIPATION 05/15/2009  . Diabetes mellitus out of control (HCC) 01/12/2009  . HYPERLIPIDEMIA 01/12/2009  . ANXIETY 01/12/2009  . DEPRESSION 01/12/2009  . HYPERTENSION 01/12/2009  . THYROIDECTOMY, HX OF 01/12/2009    Past Surgical History:  Procedure Laterality Date  . COLONOSCOPY  N/A 02/16/2012   Procedure: COLONOSCOPY;  Surgeon: Vertell NovakJames L Edwards Jr., MD;  [facility];  Service: Endoscopy;  Laterality: N/A;  . ESOPHAGOGASTRODUODENOSCOPY N/A 02/16/2012   Procedure: ESOPHAGOGASTRODUODENOSCOPY (EGD);  Surgeon: Vertell NovakJames L Edwards Jr., MD;  [facility];  Service: Endoscopy;  Laterality: N/A;  . THYROIDECTOMY     in 2000    OB History    No data available       Home Medications    Prior to Admission medications   Medication Sig Start Date End Date Taking? Authorizing Provider  alendronate (FOSAMAX) 70 MG tablet Take 70 mg by mouth every 7 (seven) days. Take with a full glass of water on an empty stomach.   Yes Historical Provider, MD  calcium citrate-vitamin D (CITRACAL+D) 315-200 MG-UNIT per tablet Take 1 tablet by mouth daily.    Yes Historical Provider, MD  calcium-vitamin D (OSCAL WITH D) 500-200 MG-UNIT per tablet Take 1 tablet by mouth daily. 02/17/12  Yes Ripudeep Jenna LuoK Rai, MD  donepezil (ARICEPT) 5 MG tablet Take 1 tablet (5 mg total) by mouth at bedtime. Patient taking differently: Take 10 mg by mouth at bedtime.  06/06/11  Yes Floydene FlockSteven J Newton, MD  glimepiride (AMARYL) 2 MG tablet Take 2 mg by mouth daily with breakfast.   Yes Historical Provider, MD  haloperidol (HALDOL) 0.5 MG tablet Take 0.5 mg by mouth at bedtime.   Yes Historical Provider, MD  linagliptin (TRADJENTA) 5 MG TABS tablet Take 5 mg by mouth daily.  Yes Historical Provider, MD  metFORMIN (GLUCOPHAGE) 500 MG tablet Take 1,000 mg by mouth 2 (two) times daily with a meal.   Yes Historical Provider, MD  metFORMIN (GLUCOPHAGE-XR) 500 MG 24 hr tablet Take 500 mg by mouth every evening.   Yes Historical Provider, MD  mirtazapine (REMERON) 15 MG tablet Take 15 mg by mouth at bedtime.   Yes Historical Provider, MD  NAMENDA XR 28 MG CP24 Take 28 mg by mouth daily after breakfast.  09/02/13  Yes Historical Provider, MD  omeprazole (PRILOSEC) 40 MG capsule Take 1 capsule (40 mg total) by mouth  daily. 06/06/11  Yes Floydene FlockSteven J Newton, MD  valsartan (DIOVAN) 160 MG tablet  09/09/13  Yes Historical Provider, MD  docusate sodium 100 MG CAPS Take 100 mg by mouth 2 (two) times daily as needed for constipation. Patient not taking: Reported on 11/25/2015 02/17/12   Ripudeep Jenna LuoK Rai, MD  JENTADUETO 2.05-998 MG TABS  07/19/13   Historical Provider, MD    Family History Family History  Problem Relation Age of Onset  . Cancer Sister     brain  . Diabetes Son   . Hypertension Son   . Diabetes Daughter     Social History Social History  Substance Use Topics  . Smoking status: Never Smoker  . Smokeless tobacco: Never Used  . Alcohol use No     Allergies   Patient has no known allergies.   Review of Systems Review of Systems  All other systems reviewed and are negative.   Physical Exam Updated Vital Signs BP 139/77   Pulse 100   Temp 99 F (37.2 C) (Oral)   Resp 16   SpO2 98%   Physical Exam  Constitutional: She is oriented to person, place, and time. She appears well-developed and well-nourished.  HENT:  Head: Normocephalic and atraumatic.  Eyes: Conjunctivae are normal. Pupils are equal, round, and reactive to light. Right eye exhibits no discharge. Left eye exhibits no discharge. No scleral icterus.  Neck: Normal range of motion. No JVD present. No tracheal deviation present.  Pulmonary/Chest: Effort normal. No stridor.  Neurological: She is alert and oriented to person, place, and time. Coordination normal.  Psychiatric: She has a normal mood and affect. Her behavior is normal. Judgment and thought content normal.  Nursing note and vitals reviewed.    ED Treatments / Results  Labs (all labs ordered are listed, but only abnormal results are displayed) Labs Reviewed  BASIC METABOLIC PANEL - Abnormal; Notable for the following:       Result Value   Glucose, Bld 539 (*)    BUN 29 (*)    Creatinine, Ser 1.38 (*)    GFR calc non Af Amer 36 (*)    GFR calc Af Amer 42  (*)    All other components within normal limits  CBC - Abnormal; Notable for the following:    WBC 13.4 (*)    All other components within normal limits  CBC WITH DIFFERENTIAL/PLATELET - Abnormal; Notable for the following:    WBC 13.3 (*)    Neutro Abs 9.2 (*)    All other components within normal limits  BASIC METABOLIC PANEL - Abnormal; Notable for the following:    Glucose, Bld 490 (*)    BUN 29 (*)    Creatinine, Ser 1.37 (*)    GFR calc non Af Amer 36 (*)    GFR calc Af Amer 42 (*)    All other components within normal  limits  CBG MONITORING, ED - Abnormal; Notable for the following:    Glucose-Capillary 459 (*)    All other components within normal limits  CBG MONITORING, ED - Abnormal; Notable for the following:    Glucose-Capillary 462 (*)    All other components within normal limits  URINALYSIS, ROUTINE W REFLEX MICROSCOPIC (NOT AT [facility])  CBG MONITORING, ED    EKG  EKG Interpretation None       Radiology No results found.  Procedures Procedures (including critical care time)  Medications Ordered in ED Medications  sodium chloride 0.9 % bolus 1,000 mL (1,000 mLs Intravenous New Bag/Given 11/25/15 1511)  insulin aspart (novoLOG) injection 8 Units (8 Units Intravenous Given 11/25/15 1511)     Initial Impression / Assessment and Plan / ED Course  I have reviewed the triage vital signs and the nursing notes.  Pertinent labs & imaging results that were available during my care of the patient were reviewed by me and considered in my medical decision making (see chart for details).  Clinical Course     Final Clinical Impressions(s) / ED Diagnoses   Final diagnoses:  Hyperglycemia   Labs: CBC with differential, BMP, CBG, - CBG 539  Imaging:  Consults:  Therapeutics: Insulin 8 units  Discharge Meds:   Assessment/Plan:  75 year old female presents today with hyperglycemia. She is a type II diabetic currently on metformin. Patient was noted to be  slightly dehydrated here, with elevated glucose. Patient appears well in no acute distress, no signs of DKA or hyperosmolar state. Patient given 8 units of insulin, and 1 L of fluid. She'll be reassessed thereafter.   Nursing staff informed the patient axilla ripped out her IV. She will need a another IV placed for fluid hydration in the setting of slightly elevated creatinine, and hyperglycemia. Patient care will be signed out to oncoming provider pending repeat CBG and fluid administration.     New Prescriptions New Prescriptions   No medications on file     Eyvonne Mechanic, PA-C 11/25/15 1644    Courteney Lyn Corlis Leak, MD 12/03/15 [phone]

## 2015-11-25 NOTE — ED Triage Notes (Addendum)
Pt went to adult daycare center today and CBG was 500. Pt did not take her diabetes medication this morning.  Pt denies any other symptoms.

## 2016-07-10 ENCOUNTER — Emergency Department (HOSPITAL_COMMUNITY)
Admission: EM | Admit: 2016-07-10 | Discharge: 2016-07-10 | Disposition: A | Discharge status: Home / Self Care | Payer: Medicare Other | Attending: Emergency Medicine | Admitting: Emergency Medicine

## 2016-07-10 ENCOUNTER — Emergency Department (HOSPITAL_COMMUNITY): Payer: Medicare Other

## 2016-07-10 ENCOUNTER — Encounter (HOSPITAL_COMMUNITY): Payer: Self-pay | Admitting: *Deleted

## 2016-07-10 DIAGNOSIS — I10 Essential (primary) hypertension: Secondary | ICD-10-CM | POA: Diagnosis not present

## 2016-07-10 DIAGNOSIS — R0789 Other chest pain: Secondary | ICD-10-CM | POA: Diagnosis not present

## 2016-07-10 DIAGNOSIS — Z79899 Other long term (current) drug therapy: Secondary | ICD-10-CM | POA: Diagnosis not present

## 2016-07-10 DIAGNOSIS — E119 Type 2 diabetes mellitus without complications: Secondary | ICD-10-CM | POA: Insufficient documentation

## 2016-07-10 DIAGNOSIS — Z7984 Long term (current) use of oral hypoglycemic drugs: Secondary | ICD-10-CM | POA: Diagnosis not present

## 2016-07-10 DIAGNOSIS — R220 Localized swelling, mass and lump, head: Secondary | ICD-10-CM | POA: Diagnosis not present

## 2016-07-10 DIAGNOSIS — R05 Cough: Secondary | ICD-10-CM | POA: Insufficient documentation

## 2016-07-10 LAB — BASIC METABOLIC PANEL
ANION GAP: 7 (ref 5–15)
BUN: 13 mg/dL (ref 6–20)
CALCIUM: 9.3 mg/dL (ref 8.9–10.3)
CO2: 25 mmol/L (ref 22–32)
CREATININE: 0.97 mg/dL (ref 0.44–1.00)
Chloride: 104 mmol/L (ref 101–111)
GFR calc non Af Amer: 55 mL/min — ABNORMAL LOW (ref >60)
Glucose, Bld: 274 mg/dL — ABNORMAL HIGH (ref 65–99)
Potassium: 4.2 mmol/L (ref 3.5–5.1)
SODIUM: 136 mmol/L (ref 135–145)

## 2016-07-10 LAB — CBC
HCT: 40.4 % (ref 36.0–46.0)
Hemoglobin: 13.5 g/dL (ref 12.0–15.0)
MCH: 31 pg (ref 26.0–34.0)
MCHC: 33.4 g/dL (ref 30.0–36.0)
MCV: 92.9 fL (ref 78.0–100.0)
PLATELETS: 213 10*3/uL (ref 150–400)
RBC: 4.35 MIL/uL (ref 3.87–5.11)
RDW: 12.7 % (ref 11.5–15.5)
WBC: 8.1 10*3/uL (ref 4.0–10.5)

## 2016-07-10 LAB — I-STAT TROPONIN, ED
TROPONIN I, POC: 0 ng/mL (ref 0.00–0.08)
TROPONIN I, POC: 0 ng/mL (ref 0.00–0.08)

## 2016-07-10 NOTE — ED Triage Notes (Signed)
[facility] c/o left sided chest pain since waking up and swelling to lip and tongue x 1 week per pt. Pt reports chest pain radiates into L arm and has some sob. Staff called ems this morning for swelling to lip.

## 2016-07-10 NOTE — Discharge Instructions (Signed)
Continue taking her medications as prescribed. I recommend following up with your primary care provider within the next week for follow-up evaluation regarding your chest pain. Please return to the Emergency Department if symptoms worsen or new onset of fever, headache, confusion, altered mental status, chest pain, difficulty breathing, productive cough, abdominal pain, vomiting, numbness, weakness, syncope.

## 2016-07-10 NOTE — ED Notes (Signed)
Pt taken to xray 

## 2016-07-10 NOTE — ED Provider Notes (Signed)
[facility]-EMERGENCY DEPT Provider Note   CSN: [id number] Arrival date & time: 07/10/16  [id number]     History   Chief Complaint Chief Complaint  Patient presents with  . Angioedema  . Chest Pain    HPI [name] is a 76 y.o. female.  HPI   Patient is a 76 year old female with history of diabetes, hypertension, hyperlipidemia and dementia who presents to the ED via EMS from [facility] with complaint of chest pain. Patient reports last night she began having pain to the left side of her chest that radiated into her left arm. She notes the pain has remained constant but has gradually improved since arrival to the ED. EMS report patient was sent to the ED due to concern for swelling to her lip this morning. ED attending called nursing facility who reports patient has had chronic swelling to her lip for the past week. Still he reports patient being at baseline mental status. Denies any new medications or use of Lisinopril. Denies fever, chills, headache, swelling of tongue, sensation of throat closing, difficulty swallowing, drooling, trismus, shortness of breath, wheezing, palpitations, abdominal pain, nausea, vomiting, diaphoresis, numbness, weakness.  Past Medical History:  Diagnosis Date  . Anxiety   . Dementia   . Depression   . Diabetes mellitus   . Hepatitis   . Hyperlipidemia   . Hypertension     Patient Active Problem List   Diagnosis Date Noted  . Anemia 02/14/2012  . GI Bleed 02/14/2012  . Blepharitis of left upper eyelid 04/25/2011  . Cough 12/30/2010  . Right ear pain 11/20/2010  . Urinary frequency 11/20/2010  . Pain in gums 06/07/2010  . Systolic murmur 03/18/2010  . Right thigh pain 03/18/2010  . Urge incontinence 03/18/2010  . Dementia, Alzheimer's, with behavior disturbance 09/30/2009  . OSTEOPOROSIS 09/21/2009  . CONSTIPATION 05/15/2009  . Diabetes mellitus out of control (HCC) 01/12/2009  . HYPERLIPIDEMIA 01/12/2009  . ANXIETY 01/12/2009  . DEPRESSION  01/12/2009  . HYPERTENSION 01/12/2009  . THYROIDECTOMY, HX OF 01/12/2009    Past Surgical History:  Procedure Laterality Date  . COLONOSCOPY N/A 02/16/2012   Procedure: COLONOSCOPY;  Surgeon: Vertell NovakJames L Edwards Jr., MD;  [facility];  Service: Endoscopy;  Laterality: N/A;  . ESOPHAGOGASTRODUODENOSCOPY N/A 02/16/2012   Procedure: ESOPHAGOGASTRODUODENOSCOPY (EGD);  Surgeon: Vertell NovakJames L Edwards Jr., MD;  [facility];  Service: Endoscopy;  Laterality: N/A;  . THYROIDECTOMY     in 2000    OB History    No data available       Home Medications    Prior to Admission medications   Medication Sig Start Date End Date Taking? Authorizing Provider  alendronate (FOSAMAX) 70 MG tablet Take 70 mg by mouth every 7 (seven) days. Take with a full glass of water on an empty stomach.    [provider]  calcium citrate-vitamin D (CITRACAL+D) 315-200 MG-UNIT per tablet Take 1 tablet by mouth daily.     [provider]  calcium-vitamin D (OSCAL WITH D) 500-200 MG-UNIT per tablet Take 1 tablet by mouth daily. 02/17/12   Rai, Delene Ruffiniipudeep K, MD  docusate sodium 100 MG CAPS Take 100 mg by mouth 2 (two) times daily as needed for constipation. Patient not taking: Reported on 11/25/2015 02/17/12   Rai, Delene Ruffiniipudeep K, MD  donepezil (ARICEPT) 5 MG tablet Take 1 tablet (5 mg total) by mouth at bedtime. Patient taking differently: Take 10 mg by mouth at bedtime.  06/06/11   Doree AlbeeNewton, Steven  J, MD  glimepiride (AMARYL) 2 MG tablet Take 2 mg by mouth daily with breakfast.    [provider]  haloperidol (HALDOL) 0.5 MG tablet Take 0.5 mg by mouth at bedtime.    [provider]  JENTADUETO 2.05-998 MG TABS  07/19/13   [provider]  linagliptin (TRADJENTA) 5 MG TABS tablet Take 5 mg by mouth daily.    [provider]  metFORMIN (GLUCOPHAGE) 500 MG tablet Take 1,000 mg by mouth 2 (two) times daily with a meal.    [provider]  metFORMIN  (GLUCOPHAGE-XR) 500 MG 24 hr tablet Take 500 mg by mouth every evening.    [provider]  mirtazapine (REMERON) 15 MG tablet Take 15 mg by mouth at bedtime.    [provider]  NAMENDA XR 28 MG CP24 Take 28 mg by mouth daily after breakfast.  09/02/13   [provider]  omeprazole (PRILOSEC) 40 MG capsule Take 1 capsule (40 mg total) by mouth daily. 06/06/11   Floydene Flock, MD  valsartan (DIOVAN) 160 MG tablet  09/09/13   [provider]    Family History Family History  Problem Relation Age of Onset  . Cancer Sister        brain  . Diabetes Son   . Hypertension Son   . Diabetes Daughter     Social History Social History  Substance Use Topics  . Smoking status: Never Smoker  . Smokeless tobacco: Never Used  . Alcohol use No     Allergies   Patient has no known allergies.   Review of Systems Review of Systems  HENT: Positive for facial swelling.   Respiratory: Positive for cough.   Cardiovascular: Positive for chest pain.  All other systems reviewed and are negative.    Physical Exam Updated Vital Signs BP (!) 158/85 (BP Location: Right Arm)   Pulse (!) 103   Temp 97.9 F (36.6 C) (Tympanic)   Resp (!) 23   SpO2 100%   Physical Exam  Constitutional: She appears well-developed and well-nourished. No distress.  HENT:  Head: Normocephalic and atraumatic.  Mouth/Throat: Uvula is midline, oropharynx is clear and moist and mucous membranes are normal. No oropharyngeal exudate, posterior oropharyngeal edema, posterior oropharyngeal erythema or tonsillar abscesses. No tonsillar exudate.  No trismus, drooling, facial/neck swelling or stridor on exam. No muffled voice. Floor of mouth soft.  No swelling of lips/tongue/face or sensation of throat closing. Tolerating secretions.  Eyes: Conjunctivae and EOM are normal. Right eye exhibits no discharge. Left eye exhibits no discharge. No scleral icterus.  Neck: Normal range of motion. Neck  supple.  Cardiovascular: Normal rate, regular rhythm, normal heart sounds and intact distal pulses.   Pulmonary/Chest: Effort normal and breath sounds normal. No respiratory distress. She has no wheezes. She has no rales. She exhibits no tenderness.  Abdominal: Soft. Bowel sounds are normal. She exhibits no distension and no mass. There is no tenderness. There is no rebound and no guarding.  Musculoskeletal: Normal range of motion. She exhibits no edema.  Neurological: She is alert.  Pt alert and oriented to person and place only.  Skin: Skin is warm and dry. She is not diaphoretic.  Nursing note and vitals reviewed.    ED Treatments / Results  Labs (all labs ordered are listed, but only abnormal results are displayed) Labs Reviewed  BASIC METABOLIC PANEL - Abnormal; Notable for the following:       Result Value   Glucose,  Bld 274 (*)    GFR calc non Af Amer 55 (*)    All other components within normal limits  CBC  I-STAT TROPOININ, ED  I-STAT TROPOININ, ED    EKG  EKG Interpretation  Date/Time:  Sunday July 10 2016 06:21:23 EDT Ventricular Rate:  98 PR Interval:    QRS Duration: 89 QT Interval:  358 QTC Calculation: 458 R Axis:   35 Text Interpretation:  Sinus rhythm Probable left atrial enlargement Left ventricular hypertrophy No acute changes No significant change since last tracing Confirmed by Derwood Kaplan [phone]) on 07/10/2016 7:32:56 AM       Radiology Dg Chest 2 View  Result Date: 07/10/2016 CLINICAL DATA:  Chest pain for 1 day. EXAM: CHEST  2 VIEW COMPARISON:  Chest radiograph 05/09/2011. FINDINGS: Monitoring leads overlie the patient. Normal cardiac and mediastinal contours. Mild tortuosity of the thoracic aorta. Low lung volumes. Bibasilar heterogeneous pulmonary opacities. Thoracic spine degenerative changes. No pleural effusion or pneumothorax. IMPRESSION: Low lung volumes with basilar atelectasis. Electronically Signed   By: Annia Belt M.D.   On: 07/10/2016  07:08    Procedures Procedures (including critical care time)  Medications Ordered in ED Medications - No data to display   Initial Impression / Assessment and Plan / ED Course  I have reviewed the triage vital signs and the nursing notes.  Pertinent labs & imaging results that were available during my care of the patient were reviewed by me and considered in my medical decision making (see chart for details).    Pt presents from nursing facility with reported left sided chest pain that started last night. Facility reports sending pt due to concern for lip swelling which they report has been present for the past week. Denies any new medications, pt not on lisinopril. VSS. Exam unremarkable. Lungs CTAB. Exam unremarkable. No evidence of facial/mouth swelling. Patient tolerating secretions. No drooling, dysphagia, SOB, wheezing. Lungs CTAB. Discussed pt with Dr. Rhunette Croft who also evaluated pt in the ED. Plan to order EKG, labs and CXR for cardiac work-up, if negative plan to d/c pt back to facility. No evidence of facial swelling/angioedema on exam. Hx unclear after speaking with pt and nursing facility, no reported new medications or concerns of allergic reaction. EKG showed sinus rhythm with LVH, no acute ischemic change present. Trop negative. CXR negative. Labs unremarkable. On reevaluation pt reports improvement of CP and denies any SOB. Pt has remained hemodynamically stable while in the ED. Delta trop negative. I have a low suspicion for ACS, PE, dissection, or other acute cardiac event at this time. Discussed results and plan for d/c with pt and family. Advised to have pt f/u with PCP this week regarding CP. Discussed return precautions.    Final Clinical Impressions(s) / ED Diagnoses   Final diagnoses:  Atypical chest pain    New Prescriptions New Prescriptions   No medications on file     Barrett Henle, Cordelia Poche 07/10/16 1008    Derwood Kaplan, MD 07/10/16 2318

## 2017-03-09 ENCOUNTER — Encounter (HOSPITAL_COMMUNITY): Payer: Self-pay | Admitting: Emergency Medicine

## 2017-03-09 ENCOUNTER — Other Ambulatory Visit: Payer: Self-pay

## 2017-03-09 ENCOUNTER — Emergency Department (HOSPITAL_COMMUNITY): Payer: Medicare Other

## 2017-03-09 ENCOUNTER — Inpatient Hospital Stay (HOSPITAL_COMMUNITY)
Admission: EM | Admit: 2017-03-09 | Discharge: 2017-03-10 | DRG: 204 | Disposition: A | Discharge status: Skilled Nursing Facility | Payer: Medicare Other | Attending: Family Medicine | Admitting: Family Medicine

## 2017-03-09 DIAGNOSIS — Z7983 Long term (current) use of bisphosphonates: Secondary | ICD-10-CM

## 2017-03-09 DIAGNOSIS — J189 Pneumonia, unspecified organism: Secondary | ICD-10-CM | POA: Diagnosis present

## 2017-03-09 DIAGNOSIS — F329 Major depressive disorder, single episode, unspecified: Secondary | ICD-10-CM | POA: Diagnosis present

## 2017-03-09 DIAGNOSIS — Z8619 Personal history of other infectious and parasitic diseases: Secondary | ICD-10-CM | POA: Diagnosis not present

## 2017-03-09 DIAGNOSIS — R2689 Other abnormalities of gait and mobility: Secondary | ICD-10-CM | POA: Diagnosis present

## 2017-03-09 DIAGNOSIS — Z794 Long term (current) use of insulin: Secondary | ICD-10-CM | POA: Diagnosis not present

## 2017-03-09 DIAGNOSIS — R109 Unspecified abdominal pain: Secondary | ICD-10-CM | POA: Diagnosis present

## 2017-03-09 DIAGNOSIS — G309 Alzheimer's disease, unspecified: Secondary | ICD-10-CM | POA: Diagnosis present

## 2017-03-09 DIAGNOSIS — I10 Essential (primary) hypertension: Secondary | ICD-10-CM | POA: Diagnosis not present

## 2017-03-09 DIAGNOSIS — F0281 Dementia in other diseases classified elsewhere with behavioral disturbance: Secondary | ICD-10-CM | POA: Diagnosis not present

## 2017-03-09 DIAGNOSIS — R059 Cough, unspecified: Secondary | ICD-10-CM

## 2017-03-09 DIAGNOSIS — R05 Cough: Secondary | ICD-10-CM | POA: Diagnosis not present

## 2017-03-09 DIAGNOSIS — R0602 Shortness of breath: Secondary | ICD-10-CM | POA: Diagnosis not present

## 2017-03-09 DIAGNOSIS — IMO0002 Reserved for concepts with insufficient information to code with codable children: Secondary | ICD-10-CM | POA: Diagnosis present

## 2017-03-09 DIAGNOSIS — E1165 Type 2 diabetes mellitus with hyperglycemia: Secondary | ICD-10-CM | POA: Diagnosis not present

## 2017-03-09 DIAGNOSIS — F02818 Dementia in other diseases classified elsewhere, unspecified severity, with other behavioral disturbance: Secondary | ICD-10-CM | POA: Diagnosis present

## 2017-03-09 DIAGNOSIS — F419 Anxiety disorder, unspecified: Secondary | ICD-10-CM | POA: Diagnosis present

## 2017-03-09 DIAGNOSIS — Z79899 Other long term (current) drug therapy: Secondary | ICD-10-CM | POA: Diagnosis not present

## 2017-03-09 LAB — COMPREHENSIVE METABOLIC PANEL
ALT: 33 U/L (ref 14–54)
ANION GAP: 11 (ref 5–15)
AST: 42 U/L — ABNORMAL HIGH (ref 15–41)
Albumin: 3.9 g/dL (ref 3.5–5.0)
Alkaline Phosphatase: 73 U/L (ref 38–126)
BUN: 17 mg/dL (ref 6–20)
CHLORIDE: 105 mmol/L (ref 101–111)
CO2: 24 mmol/L (ref 22–32)
Calcium: 9.6 mg/dL (ref 8.9–10.3)
Creatinine, Ser: 0.86 mg/dL (ref 0.44–1.00)
Glucose, Bld: 97 mg/dL (ref 65–99)
POTASSIUM: 4 mmol/L (ref 3.5–5.1)
SODIUM: 140 mmol/L (ref 135–145)
Total Bilirubin: 0.5 mg/dL (ref 0.3–1.2)
Total Protein: 7.4 g/dL (ref 6.5–8.1)

## 2017-03-09 LAB — URINALYSIS, ROUTINE W REFLEX MICROSCOPIC
Bilirubin Urine: NEGATIVE
Glucose, UA: NEGATIVE mg/dL
HGB URINE DIPSTICK: NEGATIVE
Ketones, ur: NEGATIVE mg/dL
Leukocytes, UA: NEGATIVE
NITRITE: NEGATIVE
PH: 5 (ref 5.0–8.0)
Protein, ur: NEGATIVE mg/dL
SPECIFIC GRAVITY, URINE: 1.017 (ref 1.005–1.030)

## 2017-03-09 LAB — CBC WITH DIFFERENTIAL/PLATELET
Basophils Absolute: 0 10*3/uL (ref 0.0–0.1)
Basophils Relative: 0 %
Eosinophils Absolute: 0.2 10*3/uL (ref 0.0–0.7)
Eosinophils Relative: 2 %
HEMATOCRIT: 37.9 % (ref 36.0–46.0)
HEMOGLOBIN: 12.6 g/dL (ref 12.0–15.0)
LYMPHS ABS: 3.6 10*3/uL (ref 0.7–4.0)
Lymphocytes Relative: 41 %
MCH: 31 pg (ref 26.0–34.0)
MCHC: 33.2 g/dL (ref 30.0–36.0)
MCV: 93.1 fL (ref 78.0–100.0)
MONOS PCT: 7 %
Monocytes Absolute: 0.6 10*3/uL (ref 0.1–1.0)
NEUTROS ABS: 4.4 10*3/uL (ref 1.7–7.7)
NEUTROS PCT: 50 %
Platelets: 223 10*3/uL (ref 150–400)
RBC: 4.07 MIL/uL (ref 3.87–5.11)
RDW: 13 % (ref 11.5–15.5)
WBC: 8.8 10*3/uL (ref 4.0–10.5)

## 2017-03-09 MED ORDER — SODIUM CHLORIDE 0.9 % IV SOLN
2.0000 g | Freq: Once | INTRAVENOUS | Status: AC
  Administered 2017-03-09: 2 g via INTRAVENOUS
  Filled 2017-03-09: qty 2

## 2017-03-09 MED ORDER — VANCOMYCIN HCL IN DEXTROSE 1-5 GM/200ML-% IV SOLN
1000.0000 mg | Freq: Once | INTRAVENOUS | Status: AC
  Administered 2017-03-09: 1000 mg via INTRAVENOUS
  Filled 2017-03-09: qty 200

## 2017-03-09 NOTE — ED Notes (Signed)
Patient transported to X-ray 

## 2017-03-09 NOTE — ED Triage Notes (Signed)
Per [facility] pt coming from [facility]. Staff states pt has been complaining of lower abdominal pain and has had decreased urine output since Tuesday. Pt not complaining of anything during triage. Pt alert to self, not sure of year or why she is here. Unsure of patients baseline.

## 2017-03-09 NOTE — ED Provider Notes (Signed)
[facility]-EMERGENCY DEPT Provider Note   CSN: [id number] Arrival date & time: 03/09/17  1816     History   Chief Complaint Chief Complaint  Patient presents with  . Decreased Urine Output    HPI [name] is a 77 y.o. female presenting today for evaluation of decreased urine output since Tuesday.  Patient is alert to self, not to place or time.  Triage notes report lower abdominal pain, however patient denies lower abdominal pain when I asked her.  She denies any urinary symptoms.   She is unable to contribute to HPI further secondary to dementia.   HPI  Past Medical History:  Diagnosis Date  . Anxiety   . Dementia   . Depression   . Diabetes mellitus   . Hepatitis   . Hyperlipidemia   . Hypertension     Patient Active Problem List   Diagnosis Date Noted  . Anemia 02/14/2012  . GI Bleed 02/14/2012  . Blepharitis of left upper eyelid 04/25/2011  . Cough 12/30/2010  . Right ear pain 11/20/2010  . Urinary frequency 11/20/2010  . Pain in gums 06/07/2010  . Systolic murmur 03/18/2010  . Right thigh pain 03/18/2010  . Urge incontinence 03/18/2010  . Dementia, Alzheimer's, with behavior disturbance 09/30/2009  . OSTEOPOROSIS 09/21/2009  . CONSTIPATION 05/15/2009  . Diabetes mellitus out of control (HCC) 01/12/2009  . HYPERLIPIDEMIA 01/12/2009  . ANXIETY 01/12/2009  . DEPRESSION 01/12/2009  . HYPERTENSION 01/12/2009  . THYROIDECTOMY, HX OF 01/12/2009    Past Surgical History:  Procedure Laterality Date  . COLONOSCOPY N/A 02/16/2012   Procedure: COLONOSCOPY;  Surgeon: Vertell NovakJames L Edwards Jr., MD;  [facility];  Service: Endoscopy;  Laterality: N/A;  . ESOPHAGOGASTRODUODENOSCOPY N/A 02/16/2012   Procedure: ESOPHAGOGASTRODUODENOSCOPY (EGD);  Surgeon: Vertell NovakJames L Edwards Jr., MD;  [facility];  Service: Endoscopy;  Laterality: N/A;  . THYROIDECTOMY     in 2000    OB History    No data available       Home Medications      Prior to Admission medications   Medication Sig Start Date End Date Taking? Authorizing Provider  alendronate (FOSAMAX) 70 MG tablet Take 70 mg by mouth every 7 (seven) days. Take with a full glass of water on an empty stomach.   Yes [provider]  calcium-vitamin D (OSCAL WITH D) 500-200 MG-UNIT per tablet Take 1 tablet by mouth daily. 02/17/12  Yes Rai, Ripudeep K, MD  cetirizine (ZYRTEC) 10 MG tablet Take 10 mg by mouth daily.   Yes [provider]  donepezil (ARICEPT) 10 MG tablet Take 10 mg by mouth at bedtime.   Yes [provider]  glimepiride (AMARYL) 4 MG tablet Take 4 mg by mouth daily with breakfast.    Yes [provider]  insulin glargine (LANTUS) 100 UNIT/ML injection Inject 15 Units into the skin at bedtime.   Yes [provider]  linagliptin (TRADJENTA) 5 MG TABS tablet Take 5 mg by mouth daily.   Yes [provider]  losartan (COZAAR) 100 MG tablet Take 100 mg by mouth daily.   Yes [provider]  memantine (NAMENDA XR) 14 MG CP24 24 hr capsule Take 14 mg by mouth daily.   Yes [provider]  metFORMIN (GLUCOPHAGE-XR) 500 MG 24 hr tablet Take 500 mg by mouth every evening.   Yes [provider]  metoprolol succinate (TOPROL-XL) 25 MG 24 hr tablet Take 25 mg by mouth daily.  Yes [provider]  omeprazole (PRILOSEC) 20 MG capsule Take 20 mg by mouth daily.   Yes [provider]  calcium citrate-vitamin D (CITRACAL+D) 315-200 MG-UNIT per tablet Take 1 tablet by mouth daily.     [provider]  docusate sodium 100 MG CAPS Take 100 mg by mouth 2 (two) times daily as needed for constipation. Patient not taking: Reported on 11/25/2015 02/17/12   Rai, Delene Ruffini, MD  donepezil (ARICEPT) 5 MG tablet Take 1 tablet (5 mg total) by mouth at bedtime. Patient not taking: Reported on 03/09/2017 06/06/11   Floydene Flock, MD  haloperidol (HALDOL) 0.5 MG tablet Take 0.5 mg by mouth  at bedtime.    [provider]  JENTADUETO 2.05-998 MG TABS  07/19/13   [provider]  metFORMIN (GLUCOPHAGE) 500 MG tablet Take 1,000 mg by mouth 2 (two) times daily with a meal.    [provider]  mirtazapine (REMERON) 15 MG tablet Take 15 mg by mouth at bedtime.    [provider]  NAMENDA XR 28 MG CP24 Take 28 mg by mouth daily after breakfast.  09/02/13   [provider]  omeprazole (PRILOSEC) 40 MG capsule Take 1 capsule (40 mg total) by mouth daily. Patient not taking: Reported on 03/09/2017 06/06/11   Floydene Flock, MD  valsartan (DIOVAN) 160 MG tablet  09/09/13   [provider]    Family History Family History  Problem Relation Age of Onset  . Cancer Sister        brain  . Diabetes Son   . Hypertension Son   . Diabetes Daughter     Social History Social History   Tobacco Use  . Smoking status: Never Smoker  . Smokeless tobacco: Never Used  Substance Use Topics  . Alcohol use: No    Alcohol/week: 0.0 oz  . Drug use: No     Allergies   Patient has no known allergies.   Review of Systems Review of Systems  Unable to perform ROS: Dementia     Physical Exam Updated Vital Signs BP (!) 175/86   Pulse 80   Temp 99.1 F (37.3 C) (Oral)   Resp 17   Ht 5\' 1"  (1.549 m)   Wt 56.7 kg (125 lb)   SpO2 98%   BMI 23.62 kg/m   Physical Exam  Constitutional: No distress.  Elderly, frail  HENT:  Head: Normocephalic and atraumatic.  Eyes: Conjunctivae are normal. Right eye exhibits no discharge. Left eye exhibits no discharge. No scleral icterus.  Neck: Normal range of motion.  Cardiovascular: Normal rate, regular rhythm and intact distal pulses.  Pulmonary/Chest: Effort normal and breath sounds normal. No stridor. No respiratory distress.  Abdominal: Soft. Bowel sounds are normal. She exhibits no distension. There is no tenderness. There is no guarding.  Musculoskeletal: She exhibits no edema or deformity.   Neurological: She is alert. She exhibits normal muscle tone.  Oriented to person, not to place or time.  Skin: Skin is warm and dry.  Psychiatric: She has a normal mood and affect. Her behavior is normal.  Nursing note and vitals reviewed.    ED Treatments / Results  Labs (all labs ordered are listed, but only abnormal results are displayed) Labs Reviewed  COMPREHENSIVE METABOLIC PANEL - Abnormal; Notable for the following components:      Result Value   AST 42 (*)    All other components within normal limits  URINE CULTURE  CBC WITH  DIFFERENTIAL/PLATELET  URINALYSIS, ROUTINE W REFLEX MICROSCOPIC    EKG  EKG Interpretation None       Radiology Dg Chest 2 View  Result Date: 03/09/2017 CLINICAL DATA:  Initial evaluation for acute cough. EXAM: CHEST - 2 VIEW COMPARISON:  Prior radiograph from 07/10/2016. FINDINGS: Mild cardiomegaly, stable. Mediastinal silhouette within normal limits. Trach air column bowed to the right about the aortic knob, stable. Lungs are hypoinflated. Patchy and hazy bibasilar opacities, right greater than left. Atelectasis is favored, although superimposed infiltrates could be considered in the correct clinical setting. No pulmonary edema or pleural effusion. No pneumothorax. No acute osseous abnormality. IMPRESSION: Shallow lung inflation with bibasilar airspace opacities. Atelectasis is favored, although infiltrate could be considered in the correct clinical setting, particularly at the right lung base. Electronically Signed   By: Rise Mu M.D.   On: 03/09/2017 22:43    Procedures Procedures (including critical care time)  Medications Ordered in ED Medications  vancomycin (VANCOCIN) IVPB 1000 mg/200 mL premix (1,000 mg Intravenous New Bag/Given 03/09/17 2325)  ceFEPIme (MAXIPIME) 2 g in sodium chloride 0.9 % 100 mL IVPB (2 g Intravenous New Bag/Given 03/09/17 2312)     Initial Impression / Assessment and Plan / ED Course  I have reviewed  the triage vital signs and the nursing notes.  Pertinent labs & imaging results that were available during my care of the patient were reviewed by me and considered in my medical decision making (see chart for details).  Clinical Course as of Mar 09 2353  Thu Mar 09, 2017  2302 PORT score 52, age 30, female -10, nursing home resident +10 liver disease history +20.  [EH]    Clinical Course User Index [EH] Cristina Gong, PA-C   Patient presents today for evaluation of decreased urinary output from her nursing facility.  This is been going on for a few days.  Labs were obtained and unremarkable.  UA without significant abnormalities.  Creatinine normal.  Chest x-ray obtained, given setting of cough, concern for pneumonia.  X-ray consistent with atelectasis or pneumonia, based on cough I am suspicious for pneumonia.  She was started on cefepime, and vancomycin for HCAP as she lives in an assisted living facility.  I spoke with the hospitalist who will evaluate patient. I feel that patient requires admission based on age, frail, and high PORT score.   Final Clinical Impressions(s) / ED Diagnoses   Final diagnoses:  Healthcare-associated pneumonia  Cough    ED Discharge Orders    None       Norman Clay 03/09/17 2355    Linwood Dibbles, MD 03/10/17 1102

## 2017-03-09 NOTE — Progress Notes (Signed)
Pharmacy Note   A consult was received from an ED physician for vancomycin per pharmacy dosing.  The patient's profile has been reviewed for ht/wt/allergies/indication/available labs.   A one time order has been placed for vancomycin 1000 mg IV .  Further antibiotics/pharmacy consults should be ordered by admitting physician if indicated.                       Thank you,   Adalberto ColeNikola Raetta Agostinelli, PharmD, BCPS Pager [phone] 03/09/2017 11:06 PM

## 2017-03-10 ENCOUNTER — Inpatient Hospital Stay (HOSPITAL_COMMUNITY): Payer: Medicare Other

## 2017-03-10 DIAGNOSIS — J189 Pneumonia, unspecified organism: Secondary | ICD-10-CM | POA: Diagnosis present

## 2017-03-10 DIAGNOSIS — G309 Alzheimer's disease, unspecified: Secondary | ICD-10-CM | POA: Diagnosis not present

## 2017-03-10 DIAGNOSIS — E1165 Type 2 diabetes mellitus with hyperglycemia: Secondary | ICD-10-CM | POA: Diagnosis not present

## 2017-03-10 DIAGNOSIS — R109 Unspecified abdominal pain: Secondary | ICD-10-CM | POA: Diagnosis not present

## 2017-03-10 DIAGNOSIS — R05 Cough: Secondary | ICD-10-CM | POA: Diagnosis present

## 2017-03-10 DIAGNOSIS — Z7983 Long term (current) use of bisphosphonates: Secondary | ICD-10-CM | POA: Diagnosis not present

## 2017-03-10 DIAGNOSIS — R0602 Shortness of breath: Secondary | ICD-10-CM | POA: Diagnosis not present

## 2017-03-10 DIAGNOSIS — Z794 Long term (current) use of insulin: Secondary | ICD-10-CM | POA: Diagnosis not present

## 2017-03-10 DIAGNOSIS — Z8619 Personal history of other infectious and parasitic diseases: Secondary | ICD-10-CM | POA: Diagnosis not present

## 2017-03-10 DIAGNOSIS — I10 Essential (primary) hypertension: Secondary | ICD-10-CM | POA: Diagnosis not present

## 2017-03-10 DIAGNOSIS — F419 Anxiety disorder, unspecified: Secondary | ICD-10-CM | POA: Diagnosis not present

## 2017-03-10 DIAGNOSIS — Z79899 Other long term (current) drug therapy: Secondary | ICD-10-CM | POA: Diagnosis not present

## 2017-03-10 DIAGNOSIS — F329 Major depressive disorder, single episode, unspecified: Secondary | ICD-10-CM | POA: Diagnosis not present

## 2017-03-10 DIAGNOSIS — F0281 Dementia in other diseases classified elsewhere with behavioral disturbance: Secondary | ICD-10-CM | POA: Diagnosis not present

## 2017-03-10 LAB — INFLUENZA PANEL BY PCR (TYPE A & B)
INFLAPCR: NEGATIVE
Influenza B By PCR: NEGATIVE

## 2017-03-10 LAB — GLUCOSE, CAPILLARY
GLUCOSE-CAPILLARY: 95 mg/dL (ref 65–99)
GLUCOSE-CAPILLARY: 121 mg/dL — AB (ref 65–99)
Glucose-Capillary: 85 mg/dL (ref 65–99)

## 2017-03-10 LAB — PROCALCITONIN

## 2017-03-10 MED ORDER — LOSARTAN POTASSIUM 50 MG PO TABS
100.0000 mg | ORAL_TABLET | Freq: Every day | ORAL | Status: DC
  Administered 2017-03-10: 100 mg via ORAL
  Filled 2017-03-10: qty 2

## 2017-03-10 MED ORDER — METOPROLOL SUCCINATE ER 50 MG PO TB24
50.0000 mg | ORAL_TABLET | Freq: Every day | ORAL | Status: DC
  Administered 2017-03-10: 50 mg via ORAL
  Filled 2017-03-10: qty 1

## 2017-03-10 MED ORDER — ONDANSETRON HCL 4 MG/2ML IJ SOLN: 4.0000 mg | Freq: Four times a day (QID) | INTRAMUSCULAR | Status: DC | PRN

## 2017-03-10 MED ORDER — ONDANSETRON HCL 4 MG PO TABS: 4.0000 mg | ORAL_TABLET | Freq: Four times a day (QID) | ORAL | Status: DC | PRN

## 2017-03-10 MED ORDER — PANTOPRAZOLE SODIUM 40 MG PO TBEC
40.0000 mg | DELAYED_RELEASE_TABLET | Freq: Every day | ORAL | Status: DC
  Administered 2017-03-10: 40 mg via ORAL
  Filled 2017-03-10: qty 1

## 2017-03-10 MED ORDER — VANCOMYCIN HCL IN DEXTROSE 750-5 MG/150ML-% IV SOLN
750.0000 mg | INTRAVENOUS | Status: DC
  Filled 2017-03-10: qty 150

## 2017-03-10 MED ORDER — METOPROLOL SUCCINATE ER 25 MG PO TB24: 25.0000 mg | ORAL_TABLET | Freq: Every day | ORAL | Status: DC

## 2017-03-10 MED ORDER — LORATADINE 10 MG PO TABS
10.0000 mg | ORAL_TABLET | Freq: Every day | ORAL | Status: DC
  Administered 2017-03-10: 10 mg via ORAL
  Filled 2017-03-10: qty 1

## 2017-03-10 MED ORDER — DOCUSATE SODIUM 100 MG PO CAPS: 100.0000 mg | ORAL_CAPSULE | Freq: Two times a day (BID) | ORAL | Status: DC | PRN

## 2017-03-10 MED ORDER — GUAIFENESIN-CODEINE 100-10 MG/5ML PO SOLN: 5.0000 mL | Freq: Two times a day (BID) | ORAL | 0 refills | Status: AC | PRN

## 2017-03-10 MED ORDER — HALOPERIDOL 0.5 MG PO TABS
0.5000 mg | ORAL_TABLET | Freq: Every day | ORAL | Status: DC
  Filled 2017-03-10: qty 1

## 2017-03-10 MED ORDER — ENOXAPARIN SODIUM 40 MG/0.4ML ~~LOC~~ SOLN: 40.0000 mg | SUBCUTANEOUS | Status: DC

## 2017-03-10 MED ORDER — ALBUTEROL SULFATE (2.5 MG/3ML) 0.083% IN NEBU: 2.5000 mg | INHALATION_SOLUTION | Freq: Four times a day (QID) | RESPIRATORY_TRACT | Status: DC | PRN

## 2017-03-10 MED ORDER — MEMANTINE HCL ER 28 MG PO CP24
28.0000 mg | ORAL_CAPSULE | Freq: Every day | ORAL | Status: DC
  Administered 2017-03-10: 28 mg via ORAL
  Filled 2017-03-10: qty 1

## 2017-03-10 MED ORDER — GUAIFENESIN-CODEINE 100-10 MG/5ML PO SOLN: 5.0000 mL | Freq: Four times a day (QID) | ORAL | Status: DC | PRN

## 2017-03-10 MED ORDER — SODIUM CHLORIDE 0.9 % IV SOLN
2.0000 g | INTRAVENOUS | Status: DC
  Filled 2017-03-10: qty 2

## 2017-03-10 MED ORDER — INSULIN ASPART 100 UNIT/ML ~~LOC~~ SOLN: 0.0000 [IU] | Freq: Three times a day (TID) | SUBCUTANEOUS | Status: DC

## 2017-03-10 MED ORDER — DONEPEZIL HCL 5 MG PO TABS
5.0000 mg | ORAL_TABLET | Freq: Every day | ORAL | Status: DC
  Filled 2017-03-10: qty 1

## 2017-03-10 MED ORDER — MIRTAZAPINE 15 MG PO TABS: 15.0000 mg | ORAL_TABLET | Freq: Every day | ORAL | Status: DC

## 2017-03-10 NOTE — Evaluation (Signed)
Physical Therapy Evaluation Patient Details Name: Michaela BridgeMary H [name] MRN: [id number] DOB: 03-19-1940 Today's Date: 03/10/2017   History of Present Illness  Pt is a 77 year old female with hx of depression, hepatitis, DM, HTN, Alzheimer's dementia and admitted for HCAP.  Pt is from ALF.  Clinical Impression  Pt admitted with above diagnosis. Pt currently with functional limitations due to the deficits listed below (see PT Problem List).  Pt will benefit from skilled PT to increase their independence and safety with mobility to allow discharge to the venue listed below.  Pt assisted OOB and over to recliner.  Pt overall mod assist currently for transfers.  Pt also reports fatigue.  SPO2 100% on room air.  Pt pleasantly confused and assisting as able with multimodal cues however poor historian.  Per chart, pt from ALF, if pt unable to return to ALF at current assist level, pt may need SNF.     Follow Up Recommendations Supervision/Assistance - 24 hour;SNF    Equipment Recommendations  None recommended by PT    Recommendations for Other Services       Precautions / Restrictions Precautions Precautions: Fall Precaution Comments: incontinent      Mobility  Bed Mobility Overal bed mobility: Needs Assistance Bed Mobility: Supine to Sit     Supine to sit: Min assist     General bed mobility comments: assist for trunk upright  Transfers Overall transfer level: Needs assistance Equipment used: Rolling walker (2 wheeled) Transfers: Sit to/from [employer] CorporationStand;Stand Pivot Transfers Sit to Stand: Mod assist Stand pivot transfers: Mod assist       General transfer comment: multimodal cues for technique, assist to rise and steady, pt with BM upon standing, pericare provided and pt returned to sitting for rest break prior to stand pivot transfer  Ambulation/Gait                Stairs            Wheelchair Mobility    Modified Rankin (Stroke Patients Only)       Balance                                              Pertinent Vitals/Pain Pain Assessment: No/denies pain    Home Living       Type of Home: Assisted living                Prior Function Level of Independence: Needs assistance   Gait / Transfers Assistance Needed: person assist for mobility per chart review, pt from ALF, poor historian           Hand Dominance        Extremity/Trunk Assessment        Lower Extremity Assessment Lower Extremity Assessment: Generalized weakness       Communication   Communication: No difficulties  Cognition Arousal/Alertness: Awake/alert Behavior During Therapy: Flat affect Overall Cognitive Status: No family/caregiver present to determine baseline cognitive functioning                                 General Comments: hx dementia, appears pleasantly confused, attempts to follow commands, requires increased time to process with simple cues      General Comments      Exercises     Assessment/Plan  PT Assessment Patient needs continued PT services  PT Problem List Decreased strength;Decreased mobility;Decreased activity tolerance;Decreased balance;Decreased knowledge of use of DME;Decreased cognition       PT Treatment Interventions DME instruction;Gait training;Therapeutic activities;Therapeutic exercise;Functional mobility training;Patient/family education;Balance training    PT Goals (Current goals can be found in the Care Plan section)  Acute Rehab PT Goals PT Goal Formulation: Patient unable to participate in goal setting Time For Goal Achievement: 03/24/17 Potential to Achieve Goals: Good    Frequency Min 2X/week   Barriers to discharge        Co-evaluation               AM-PAC PT "6 Clicks" Daily Activity  Outcome Measure Difficulty turning over in bed (including adjusting bedclothes, sheets and blankets)?: A Lot Difficulty moving from lying on back to sitting on the side  of the bed? : Unable Difficulty sitting down on and standing up from a chair with arms (e.g., wheelchair, bedside commode, etc,.)?: Unable Help needed moving to and from a bed to chair (including a wheelchair)?: A Lot Help needed walking in hospital room?: Total Help needed climbing 3-5 steps with a railing? : Total 6 Click Score: 8    End of Session Equipment Utilized During Treatment: Gait belt Activity Tolerance: Patient limited by fatigue Patient left: in chair;with call bell/phone within reach;with chair alarm set   PT Visit Diagnosis: Other abnormalities of gait and mobility (R26.89)    Time: 1610-9604 PT Time Calculation (min) (ACUTE ONLY): 20 min   Charges:   PT Evaluation $PT Eval Low Complexity: 1 Low     PT G CodesZenovia Jarred, PT, DPT 03/10/2017 Pager: [phone]  Maida Sale E 03/10/2017, 10:53 AM

## 2017-03-10 NOTE — ED Notes (Addendum)
ED TO INPATIENT HANDOFF REPORT  Name/Age/Gender [name] 77 y.o. female  Code Status Code Status History    Date Active Date Inactive Code Status Order ID Comments User Context   02/14/2012 21:19 02/17/2012 16:13 Full Code [id number]  Janell Quiet, MD ED      Home/SNF/Other [place]  Chief Complaint Possible UTI  Level of Care/Admitting Diagnosis ED Disposition    ED Disposition Condition [facility] Area: [facility] [[id number]]  Level of Care: Med-Surg [16]  Diagnosis: HCAP (healthcare-associated pneumonia) [027741]  Admitting Physician: Kara Pacer  Attending Physician: Bethena Roys [phone]  Estimated length of stay: past midnight tomorrow  Certification:: I certify this patient will need inpatient services for at least 2 midnights  PT Class (Do Not Modify): Inpatient [101]  PT Acc Code (Do Not Modify): Private [1]       Medical History Past Medical History:  Diagnosis Date  . Anxiety   . Dementia   . Depression   . Diabetes mellitus   . Hepatitis   . Hyperlipidemia   . Hypertension     Allergies No Known Allergies  IV Location/Drains/Wounds Patient Lines/Drains/Airways Status   Active Line/Drains/Airways    Name:   Placement date:   Placement time:   Site:   Days:   Peripheral IV 03/09/17 Left Hand   03/09/17    1934    Hand   1          Labs/Imaging Results for orders placed or performed during the hospital encounter of 03/09/17 (from the past 48 hour(s))  Comprehensive metabolic panel     Status: Abnormal   Collection Time: 03/09/17  7:27 PM  Result Value Ref Range   Sodium 140 135 - 145 mmol/L   Potassium 4.0 3.5 - 5.1 mmol/L   Chloride 105 101 - 111 mmol/L   CO2 24 22 - 32 mmol/L   Glucose, Bld 97 65 - 99 mg/dL   BUN 17 6 - 20 mg/dL   Creatinine, Ser 0.86 0.44 - 1.00 mg/dL   Calcium 9.6 8.9 - 10.3 mg/dL   Total Protein 7.4 6.5 - 8.1 g/dL   Albumin 3.9 3.5 - 5.0 g/dL   AST  42 (H) 15 - 41 U/L   ALT 33 14 - 54 U/L   Alkaline Phosphatase 73 38 - 126 U/L   Total Bilirubin 0.5 0.3 - 1.2 mg/dL   GFR calc non Af Amer >60 >60 mL/min   GFR calc Af Amer >60 >60 mL/min    Comment: (NOTE) The eGFR has been calculated using the CKD EPI equation. This calculation has not been validated in all clinical situations. eGFR's persistently <60 mL/min signify possible Chronic Kidney Disease.    Anion gap 11 5 - 15    Comment: Performed at [facility], [place] [address], [place], [place] [address]  CBC with Differential     Status: None   Collection Time: 03/09/17  7:27 PM  Result Value Ref Range   WBC 8.8 4.0 - 10.5 K/uL   RBC 4.07 3.87 - 5.11 MIL/uL   Hemoglobin 12.6 12.0 - 15.0 g/dL   HCT 37.9 36.0 - 46.0 %   MCV 93.1 78.0 - 100.0 fL   MCH 31.0 26.0 - 34.0 pg   MCHC 33.2 30.0 - 36.0 g/dL   RDW 13.0 11.5 - 15.5 %   Platelets 223 150 - 400 K/uL   Neutrophils Relative % 50 %  Neutro Abs 4.4 1.7 - 7.7 K/uL   Lymphocytes Relative 41 %   Lymphs Abs 3.6 0.7 - 4.0 K/uL   Monocytes Relative 7 %   Monocytes Absolute 0.6 0.1 - 1.0 K/uL   Eosinophils Relative 2 %   Eosinophils Absolute 0.2 0.0 - 0.7 K/uL   Basophils Relative 0 %   Basophils Absolute 0.0 0.0 - 0.1 K/uL    Comment: Performed at [facility], [place] [address], [place], [place] [address]  Urinalysis, Routine w reflex microscopic     Status: None   Collection Time: 03/09/17  9:09 PM  Result Value Ref Range   Color, Urine YELLOW YELLOW   APPearance CLEAR CLEAR   Specific Gravity, Urine 1.017 1.005 - 1.030   pH 5.0 5.0 - 8.0   Glucose, UA NEGATIVE NEGATIVE mg/dL   Hgb urine dipstick NEGATIVE NEGATIVE   Bilirubin Urine NEGATIVE NEGATIVE   Ketones, ur NEGATIVE NEGATIVE mg/dL   Protein, ur NEGATIVE NEGATIVE mg/dL   Nitrite NEGATIVE NEGATIVE   Leukocytes, UA NEGATIVE NEGATIVE    Comment: Performed at [facility] [address] [place], [place] [address]    Dg Chest 2 View  Result Date: 03/09/2017 CLINICAL DATA:  Initial evaluation for acute cough. EXAM: CHEST - 2 VIEW COMPARISON:  Prior radiograph from 07/10/2016. FINDINGS: Mild cardiomegaly, stable. Mediastinal silhouette within normal limits. Trach air column bowed to the right about the aortic knob, stable. Lungs are hypoinflated. Patchy and hazy bibasilar opacities, right greater than left. Atelectasis is favored, although superimposed infiltrates could be considered in the correct clinical setting. No pulmonary edema or pleural effusion. No pneumothorax. No acute osseous abnormality. IMPRESSION: Shallow lung inflation with bibasilar airspace opacities. Atelectasis is favored, although infiltrate could be considered in the correct clinical setting, particularly at the right lung base. Electronically Signed   By: Jeannine Boga M.D.   On: 03/09/2017 22:43    Pending Labs Unresulted Labs (From admission, onward)   Start     Ordered   03/10/17 0206  Influenza panel by PCR (type A & B)  (Influenza PCR Panel)  Once,   R     03/10/17 0206   03/09/17 1910  Urine culture  STAT,   STAT     03/09/17 1909      Vitals/Pain Today's Vitals   03/09/17 2330 03/10/17 0000 03/10/17 0030 03/10/17 0100  BP: (!) 175/86 (!) 149/76 (!) 171/101 (!) 176/81  Pulse: 80 70 79 72  Resp: 17 14 16 14   Temp:      TempSrc:      SpO2: 98% 100% 99% 99%  Weight:      Height:        Isolation Precautions Droplet precaution  Medications Medications  ceFEPIme (MAXIPIME) 2 g in sodium chloride 0.9 % 100 mL IVPB (0 g Intravenous Stopped 03/10/17 0041)  vancomycin (VANCOCIN) IVPB 1000 mg/200 mL premix (0 mg Intravenous Stopped 03/10/17 0106)    Mobility walks with person assist

## 2017-03-10 NOTE — Progress Notes (Signed)
Progress Note   [name] [name]:[id number] DOB: [birth year] DOA: 03/09/2017 PCP: Laurann MontanaWhite, Cynthia, MD   LOS: 0 days    Brief Narrative:   [name] is a 77 year old female with a medical history significant for dementia, depression, anxiety, hepatitis, DM, and HTN. She was brought to the ED via EMS from an assisted living facility where she resides for complaints of abdominal pain and decreased urine output since Tuesday 3/5. Patient had a Tmax in ED of 99.1 and bp elevation. CXR showed bibasilar airspace opacities, infiltrate-atelectasis vs. pneumonia, particularly right lung base. Patient was treated for HCAP coverage with IV Vancomycin and IV cefepime due to her clinical presentation with a cough. Patient was admitted based on age, frail, and high PORT score.   Patient presents with significant dementia and is not able to give a detailed history. She does not appear to answer some questions appropriately but is able to respond verbally to all communication.   Assessment/Plan:   Principal Problem:   HCAP (healthcare-associated pneumonia) Active Problems:   Diabetes mellitus out of control (HCC)   Dementia, Alzheimer's, with behavior disturbance   Essential hypertension  HCAP -Reports a slight cough yesterday with mild SOB, does not require O2. -CXR 3/7 showed bibasilar airspace opacities, infiltrate-atelectasis vs. pneumonia, particularly right lung base. -She is afebrile and does not have significant WBC . -Currently on IV vancomycin and cefepime. -Repeat CXR. -Order procalcitonin blood work.  DM -Hold home meds. -SSI.  Dementia; depression -Continue management with memantine, haldol, donepezil.  Hypertension -Continue management with losartan, metoprolol.  Family Communication/Anticipated D/C date and plan/Code Status   DVT prophylaxis: Lovenox. Code Status: Full code. Family Communication: None. Disposition Plan: Return to assisted living facility once stable  and infectious processes ruled out.   Medical Consultants:    None   Procedures:    None.   Antimicrobials:    IV vancomycin and cefepime   Subjective:   [name] responds to verbal communication and states that she feels she has improved since being admitted yesterday. She notes that she does feel a little SOB, has slight blurred vision, diarrhea, intermittent and mild headaches, and more than normal paresthesia in upper and lower extremities. She mentioned that she has not had a cough today as opposed to yesterday and has no abdominal pain.  Due to her level of dementia it is hard to denote the accuracy of her ROS.   Objective:   Vitals:   03/10/17 0030 03/10/17 0100 03/10/17 0230 03/10/17 0348  BP: (!) 171/101 (!) 176/81 (!) 153/86 (!) 177/91  Pulse: 79 72 76 70  Resp: 16 14 15 18   Temp:    98.6 F (37 C)  TempSrc:    Oral  SpO2: 99% 99% 100% 98%  Weight:    59.3 kg (130 lb 11.7 oz)  Height:        Intake/Output Summary (Last 24 hours) at 03/10/2017 1056 Last data filed at 03/10/2017 1000 Gross per 24 hour  Intake 1100 ml  Output 250 ml  Net 850 ml   Filed Weights   03/09/17 2326 03/10/17 0348  Weight: 56.7 kg (125 lb) 59.3 kg (130 lb 11.7 oz)     Physical Exam:   Constitutional: NAD, disoriented to place and time, petite elderly lady. Skin: No rashes or lesions. Cardiovascular: RRR, no m/r/g. No LE edema.  Respiratory: CTAB, no wheezing, no crackles, no rhonchi. Reduced breathe sounds in right lower lobe. Normal WOB. No accessory muscle use.  Abdomen:  No TTP. Bowel sounds positive.  Musculoskeletal: No clubbing / cyanosis.  Psychiatric: Disoriented to place and time. Able to give verbal responses to verbal communication. Please mood.   Data Reviewed:   I have independently reviewed following labs and imaging studies:   CBC: Recent Labs  Lab 03/11/17 1927  WBC 8.8  NEUTROABS 4.4  HGB 12.6  HCT 37.9  MCV 93.1  PLT 223   Basic  Metabolic Panel: Recent Labs  Lab 03/11/17 1927  NA 140  K 4.0  CL 105  CO2 24  GLUCOSE 97  BUN 17  CREATININE 0.86  CALCIUM 9.6   GFR: Estimated Creatinine Clearance: 44.6 mL/min (by C-G formula based on SCr of 0.86 mg/dL). Liver Function Tests: Recent Labs  Lab 03/11/2017 1927  AST 42*  ALT 33  ALKPHOS 73  BILITOT 0.5  PROT 7.4  ALBUMIN 3.9   No results for input(s): LIPASE, AMYLASE in the last 168 hours. No results for input(s): AMMONIA in the last 168 hours. Coagulation Profile: No results for input(s): INR, PROTIME in the last 168 hours. Cardiac Enzymes: No results for input(s): CKTOTAL, CKMB, CKMBINDEX, TROPONINI in the last 168 hours. BNP (last 3 results) No results for input(s): PROBNP in the last 8760 hours. HbA1C: No results for input(s): HGBA1C in the last 72 hours. CBG: Recent Labs  Lab 03/10/17 0808  GLUCAP 95   Lipid Profile: No results for input(s): CHOL, HDL, LDLCALC, TRIG, CHOLHDL, LDLDIRECT in the last 72 hours. Thyroid Function Tests: No results for input(s): TSH, T4TOTAL, FREET4, T3FREE, THYROIDAB in the last 72 hours. Anemia Panel: No results for input(s): VITAMINB12, FOLATE, FERRITIN, TIBC, IRON, RETICCTPCT in the last 72 hours. Urine analysis:    Component Value Date/Time   COLORURINE YELLOW 03-11-2017 2109   APPEARANCEUR CLEAR 03-11-2017 2109   LABSPEC 1.017 March 11, 2017 2109   PHURINE 5.0 2017/03/11 2109   GLUCOSEU NEGATIVE 03-11-2017 2109   HGBUR NEGATIVE 03-11-2017 2109   HGBUR negative 01/28/2009 1523   BILIRUBINUR NEGATIVE 03-11-2017 2109   BILIRUBINUR NEG 04/04/2011 0903   KETONESUR NEGATIVE 11-Mar-2017 2109   PROTEINUR NEGATIVE March 11, 2017 2109   UROBILINOGEN 0.2 04/04/2011 0903   UROBILINOGEN 0.2 01/28/2009 1523   NITRITE NEGATIVE Mar 11, 2017 2109   LEUKOCYTESUR NEGATIVE 2017-03-11 2109   Sepsis Labs: Invalid input(s): PROCALCITONIN, LACTICIDVEN  No results found for this or any previous visit (from the past 240 hour(s)).      Radiology Studies: Dg Chest 2 View  Result Date: March 11, 2017 CLINICAL DATA:  Initial evaluation for acute cough. EXAM: CHEST - 2 VIEW COMPARISON:  Prior radiograph from 07/10/2016. FINDINGS: Mild cardiomegaly, stable. Mediastinal silhouette within normal limits. Trach air column bowed to the right about the aortic knob, stable. Lungs are hypoinflated. Patchy and hazy bibasilar opacities, right greater than left. Atelectasis is favored, although superimposed infiltrates could be considered in the correct clinical setting. No pulmonary edema or pleural effusion. No pneumothorax. No acute osseous abnormality. IMPRESSION: Shallow lung inflation with bibasilar airspace opacities. Atelectasis is favored, although infiltrate could be considered in the correct clinical setting, particularly at the right lung base. Electronically Signed   By: Rise Mu M.D.   On: 03/11/2017 22:43      Medication:   . donepezil  5 mg Oral QHS  . enoxaparin (LOVENOX) injection  40 mg Subcutaneous Q24H  . haloperidol  0.5 mg Oral QHS  . insulin aspart  0-9 Units Subcutaneous TID WC  . loratadine  10 mg Oral Daily  . losartan  100  mg Oral Daily  . memantine  28 mg Oral QPC breakfast  . metoprolol succinate  50 mg Oral Daily  . mirtazapine  15 mg Oral QHS  . pantoprazole  40 mg Oral Daily    Continuous Infusions: . ceFEPime (MAXIPIME) IV    . vancomycin      Signed, Garner Nash, PA-S [place] PA Class of [phone]

## 2017-03-10 NOTE — NC FL2 (Addendum)
[place] MEDICAID FL2 LEVEL OF CARE SCREENING TOOL     IDENTIFICATION  Patient Name: [name] Birthdate: 1940/10/23 Sex: female Admission Date (Current Location): 03/09/2017  [facility] [place]   Facility and Address:  [facility],  [address], [place] [id number]      Provider Number: [id number]  Attending Physician Name and Address:  Randel PiggSilva Zapata, Dorma RussellEdwin, MD  Relative Name and Phone Number:       Current Level of Care: Hospital Recommended Level of Care: Assisted Living Facility Prior Approval Number:    Date Approved/Denied:   PASRR Number:    Discharge Plan:      Current Diagnoses: Patient Active Problem List   Diagnosis Date Noted  . HCAP (healthcare-associated pneumonia) 03/10/2017  . Anemia 02/14/2012  . GI Bleed 02/14/2012  . Blepharitis of left upper eyelid 04/25/2011  . Cough 12/30/2010  . Right ear pain 11/20/2010  . Urinary frequency 11/20/2010  . Pain in gums 06/07/2010  . Systolic murmur 03/18/2010  . Right thigh pain 03/18/2010  . Urge incontinence 03/18/2010  . Dementia, Alzheimer's, with behavior disturbance 09/30/2009  . OSTEOPOROSIS 09/21/2009  . CONSTIPATION 05/15/2009  . Diabetes mellitus out of control (HCC) 01/12/2009  . HYPERLIPIDEMIA 01/12/2009  . ANXIETY 01/12/2009  . DEPRESSION 01/12/2009  . Essential hypertension 01/12/2009  . THYROIDECTOMY, HX OF 01/12/2009    Orientation RESPIRATION BLADDER Height & Weight     Self  Normal Continent Weight: 130 lb 11.7 oz (59.3 kg) Height:  5\' 1"  (154.9 cm)  BEHAVIORAL SYMPTOMS/MOOD NEUROLOGICAL BOWEL NUTRITION STATUS      Continent Diet(No table salt)  AMBULATORY STATUS COMMUNICATION OF NEEDS Skin   Limited Assist Verbally Normal                       Personal Care Assistance Level of Assistance  Bathing, Feeding, Dressing Bathing Assistance: Limited assistance Feeding assistance: Independent Dressing Assistance: Limited assistance      Functional Limitations Info  Sight, Hearing, Speech Sight Info: Adequate Hearing Info: Adequate Speech Info: Adequate    SPECIAL CARE FACTORS FREQUENCY  PT (By licensed PT), OT (By licensed OT)     PT Frequency: 5X/WEEK OT Frequency: 5X/WEEK            Contractures Contractures Info: Not present    Additional Factors Info  Code Status, Allergies, Psychotropic, Insulin Sliding Scale Code Status Info: Fullcode Allergies Info: Allergies: No Known Allergies Psychotropic Info: Remeron, Haldol, Aricept Insulin Sliding Scale Info: 0-9units 3x a day w/ meal        Current Medications (03/10/2017):  This is the current hospital active medication list Current Facility-Administered Medications  Medication Dose Route Frequency Provider Last Rate Last Dose  . albuterol (PROVENTIL) (2.5 MG/3ML) 0.083% nebulizer solution 2.5 mg  2.5 mg Nebulization Q6H PRN Emokpae, Ejiroghene E, MD      . docusate sodium (COLACE) capsule 100 mg  100 mg Oral BID PRN Emokpae, Ejiroghene E, MD      . donepezil (ARICEPT) tablet 5 mg  5 mg Oral QHS Emokpae, Ejiroghene E, MD      . enoxaparin (LOVENOX) injection 40 mg  40 mg Subcutaneous Q24H Emokpae, Ejiroghene E, MD      . guaiFENesin-codeine 100-10 MG/5ML solution 5 mL  5 mL Oral Q6H PRN Emokpae, Ejiroghene E, MD      . haloperidol (HALDOL) tablet 0.5 mg  0.5 mg Oral QHS Emokpae, Ejiroghene E, MD      .  insulin aspart (novoLOG) injection 0-9 Units  0-9 Units Subcutaneous TID WC Emokpae, Ejiroghene E, MD      . loratadine (CLARITIN) tablet 10 mg  10 mg Oral Daily Emokpae, Ejiroghene E, MD   10 mg at 03/10/17 1221  . losartan (COZAAR) tablet 100 mg  100 mg Oral Daily Emokpae, Ejiroghene E, MD   100 mg at 03/10/17 1220  . memantine (NAMENDA XR) 24 hr capsule 28 mg  28 mg Oral QPC breakfast Emokpae, Ejiroghene E, MD   28 mg at 03/10/17 1221  . metoprolol succinate (TOPROL-XL) 24 hr tablet 50 mg  50 mg Oral Daily Randel Pigg, Dorma Russell, MD   50 mg at 03/10/17 1221   . mirtazapine (REMERON) tablet 15 mg  15 mg Oral QHS Emokpae, Ejiroghene E, MD      . ondansetron (ZOFRAN) tablet 4 mg  4 mg Oral Q6H PRN Emokpae, Ejiroghene E, MD       Or  . ondansetron (ZOFRAN) injection 4 mg  4 mg Intravenous Q6H PRN Emokpae, Ejiroghene E, MD      . pantoprazole (PROTONIX) EC tablet 40 mg  40 mg Oral Daily Emokpae, Ejiroghene E, MD   40 mg at 03/10/17 1221     Discharge Medications: Allergies as of 03/10/2017   No Known Allergies             Medication List     STOP taking these medications   calcium citrate-vitamin D 315-200 MG-UNIT tablet Commonly known as:  CITRACAL+D   DSS 100 MG Caps   JENTADUETO 2.05-998 MG Tabs Generic drug:  Linagliptin-metFORMIN HCl   valsartan 160 MG tablet Commonly known as:  DIOVAN     TAKE these medications   alendronate 70 MG tablet Commonly known as:  FOSAMAX Take 70 mg by mouth every 7 (seven) days. Take with a full glass of water on an empty stomach.   AMARYL 4 MG tablet Generic drug:  glimepiride Take 4 mg by mouth daily with breakfast.   calcium-vitamin D 500-200 MG-UNIT tablet Commonly known as:  OSCAL WITH D Take 1 tablet by mouth daily.   cetirizine 10 MG tablet Commonly known as:  ZYRTEC Take 10 mg by mouth daily.   donepezil 5 MG tablet Commonly known as:  ARICEPT Take 1 tablet (5 mg total) by mouth at bedtime. What changed:  Another medication with the same name was removed. Continue taking this medication, and follow the directions you see here.   guaiFENesin-codeine 100-10 MG/5ML syrup Take 5 mLs by mouth every 12 (twelve) hours as needed for up to 3 days for cough.   haloperidol 0.5 MG tablet Commonly known as:  HALDOL Take 0.5 mg by mouth at bedtime.   insulin glargine 100 UNIT/ML injection Commonly known as:  LANTUS Inject 15 Units into the skin at bedtime.   linagliptin 5 MG Tabs tablet Commonly known as:  TRADJENTA Take 5 mg by mouth daily.   losartan 100 MG  tablet Commonly known as:  COZAAR Take 100 mg by mouth daily.   metFORMIN 500 MG 24 hr tablet Commonly known as:  GLUCOPHAGE-XR Take 500 mg by mouth every evening. What changed:  Another medication with the same name was removed. Continue taking this medication, and follow the directions you see here.   metoprolol succinate 25 MG 24 hr tablet Commonly known as:  TOPROL-XL Take 25 mg by mouth daily.   mirtazapine 15 MG tablet Commonly known as:  REMERON Take 15 mg by mouth at  bedtime.   NAMENDA XR 28 MG Cp24 24 hr capsule Generic drug:  memantine Take 28 mg by mouth daily after breakfast. What changed:  Another medication with the same name was removed. Continue taking this medication, and follow the directions you see here.   omeprazole 40 MG capsule Commonly known as:  PRILOSEC Take 1 capsule (40 mg total) by mouth daily. What changed:  Another medication with the same name was removed. Continue taking this medication, and follow the directions you see here.         Follow-up Information    Laurann Montana, MD. Schedule an appointment as soon as possible for a visit in 1 week(s).   Specialty:  Family Medicine Why:  Hospital follow-up Contact information: [address] [place] [address] [phone]       Relevant Imaging Results:  Relevant Lab Results:   Additional Information SSN:[id number]  Clearance Coots, LCSW

## 2017-03-10 NOTE — Evaluation (Signed)
Clinical/Bedside Swallow Evaluation Patient Details  Name: [name] MRN: [id number] Date of Birth: 01-18-1940  Today's Date: 03/10/2017 Time: SLP Start Time (ACUTE ONLY): 1130 SLP Stop Time (ACUTE ONLY): 1150 SLP Time Calculation (min) (ACUTE ONLY): 20 min  Past Medical History:  Past Medical History:  Diagnosis Date  . Anxiety   . Dementia   . Depression   . Diabetes mellitus   . Hepatitis   . Hyperlipidemia   . Hypertension    Past Surgical History:  Past Surgical History:  Procedure Laterality Date  . COLONOSCOPY N/A 02/16/2012   Procedure: COLONOSCOPY;  Surgeon: [name]., MD;  Location: [name];  Service: Endoscopy;  Laterality: N/A;  . ESOPHAGOGASTRODUODENOSCOPY N/A 02/16/2012   Procedure: ESOPHAGOGASTRODUODENOSCOPY (EGD);  Surgeon: [name]., MD;  Location: [facility] ENDOSCOPY;  Service: Endoscopy;  Laterality: N/A;  . THYROIDECTOMY     in 20030   HPI:  77 year old female admited 03/09/17 with decreased urine output, abdominal pain. PMH significant for dementia, depression, hepatitis, DM, HTN. CXR = BLL opacities   Assessment / Plan / Recommendation Clinical Impression  Pt presents with functional oral motor [name]. She is edentulous at the time of evaluation, but reports she has dentures. No overt s/s aspiration observed on any consistency tested. Adequate oral prep and timely swallow observed. Given dementia and BLL opacities, recommend consideration of MBS to objectively assess swallow function and rule out silent aspiration if lung sounds do not clear in a timely manner.   Will downgrade diet to dys 3/chop meats and thin liquid to facilitate effective mastication and adequate po intake. No further ST intervention recommended at this time. Please reconsult if needs arise. RN informed of recommendations.   SLP Visit Diagnosis: Dysphagia, unspecified (R13.10)    Aspiration Risk  Mild aspiration risk    Diet Recommendation Dysphagia 3 (Mech  soft);Thin liquid   Liquid Administration via: Cup;Straw Medication Administration: Whole meds with liquid Supervision: Patient able to self feed Compensations: Minimize environmental distractions;Small sips/bites;Slow rate Postural Changes: Seated upright at 90 degrees    Other  Recommendations Oral Care Recommendations: Oral care BID   Follow up Recommendations None          Prognosis Prognosis for Safe Diet Advancement: Fair      Swallow Study   General Date of Onset: 03/09/17 HPI: 77 year old female admited 03/09/17 with decreased urine output, abdominal pain. PMH significant for dementia, depression, hepatitis, DM, HTN. CXR = BLL opacities Type of Study: Bedside Swallow Evaluation Previous Swallow Assessment: none Diet Prior to this Study: Regular;Thin liquids Temperature Spikes Noted: No Respiratory Status: Room air History of Recent Intubation: No Behavior/Cognition: Alert;Cooperative;Pleasant mood Oral Cavity Assessment: Within Functional Limits Oral Care Completed by SLP: No Oral Cavity - Dentition: Edentulous;Dentures, not available Vision: Functional for self-feeding Self-Feeding Abilities: Able to feed self Patient Positioning: Upright in chair Baseline Vocal Quality: Normal Volitional Cough: Cognitively unable to elicit Volitional Swallow: Unable to elicit    Oral/Motor/Sensory Function Overall Oral Motor/Sensory Function: Within functional limits   Ice Chips Ice chips: Not tested   Thin Liquid Thin Liquid: Within functional limits    Nectar Thick Nectar Thick Liquid: Not tested   Honey Thick Honey Thick Liquid: Not tested   Puree Puree: Within functional limits Presentation: Spoon   Solid   GO   Solid: Within functional limits Presentation: Self Fed       [name], [name], CCC-SLP Speech Language Pathologist [phone]  Michaela Plump  [name] 03/10/2017,11:54 AM

## 2017-03-10 NOTE — ED Notes (Signed)
Patient had bowel movement. Patient cleaned up and new gown put on.

## 2017-03-10 NOTE — Clinical Social Work Note (Addendum)
Clinical Social Work Assessment  Patient Details  Name: Michaela BridgeMary H [name] MRN: [id number] Date of Birth: [birth year]  Date of referral:  03/10/17               Reason for consult:  Facility Placement                Permission sought to share information with:  Oceanographer[employer]ility Contact Representative Permission granted to share information::  Yes, Verbal Permission Granted  Name::        Agency::   [facility]  Relationship::   Daughter  Contact Information:     Housing/Transportation Living arrangements for the past 2 months:  Assisted Living Facility Source of Information:  Adult Children Patient Interpreter Needed:  None Criminal Activity/Legal Involvement Pertinent to Current Situation/Hospitalization:  No - Comment as needed Significant Relationships:  Adult Children Lives with:  Self Do you feel safe going back to the place where you live?  No Need for family participation in patient care:  No (Coment)  Care giving concerns:   Poor Urine Output, cough.  PT recommends supervision/SNF  Social Worker assessment / plan:  CSW discussed discharge plan back to ALF with patient daughter. Patient daughter reports the patient has been living at the ALF since May.At the facility the patient is usually independent and able to get around without assistance and feed her self .She needs assistance with bathing and dressing.  PT recommends supervision/SNF. CSW confirmed with nursing Director Clydie BraunKaren at the facility they can supervision and PT for the patient. They use [facility] for PT/OT.   [facility] Daughter prefers [facility] to transport.  D/C Summary sent.  FL2 sent/ [facility] places in packet.    Employment status:    Insurance information:  Medicare PT Recommendations:  Skilled Nursing Facility Information / Referral to community resources:     Patient/Family's Response to care: Agreeable to discharge plan.   Patient/Family's Understanding of and Emotional  Response to Diagnosis, Current Treatment, and Prognosis: Patient has dementia. Patient daughter knowledgeable of patient diagnosis and follow up plan for PT at ALF.   Emotional Assessment Appearance:  Appears stated age Attitude/Demeanor/Rapport:    Affect (typically observed):  Calm Orientation:  Oriented to Self Alcohol / Substance use:  Not Applicable Psych involvement (Current and /or in the community):  No (Comment)  Discharge Needs  Concerns to be addressed:    Readmission within the last 30 days:  No Current discharge risk:  Dependent with Mobility Barriers to Discharge:  Continued Medical Work up   [employer] A Natali Lavallee, LCSW 03/10/2017, 4:25 PM

## 2017-03-10 NOTE — H&P (Signed)
History and Physical    [name] ZOX:[id number] DOB: 07/09/1940 DOA: 03/09/2017  PCP: Laurann MontanaWhite, Cynthia, MD   [facility]  Chief Complaint: Urinary poor urine output, no abdominal pain, cough  HPI: Michaela BridgeMary H Ewart is a 77 y.o. female with medical history significant for dementia, depression, hepatitis, DM, HTN, was brought to the ED [facility] with reports of abdominal pain and decreased urine output since Tuesday- now 3 days prior.  Patient has significant dementia not able to give me a detailed history answers a few some questions appropriately, mostly by nodding. Endorses cough some shortness of breath.  Denies abdominal pain.  ED Course: Temp 99.1, blood pressure elevated.  WBC 8.8, otherwise unremarkable lytes, Cr, CBC.  Chest x-ray-bibasilar airspace opacities, infiltrate-atelectasis versus pneumonia, particularly right lung base.  Patient started on HCAP coverage-IV Vanco and Zosyn.   Review of Systems: Limited due to patient's baseline mental state.  Past Medical History:  Diagnosis Date  . Anxiety   . Dementia   . Depression   . Diabetes mellitus   . Hepatitis   . Hyperlipidemia   . Hypertension     Past Surgical History:  Procedure Laterality Date  . COLONOSCOPY N/A 02/16/2012   Procedure: COLONOSCOPY;  Surgeon: Vertell NovakJames L Edwards Jr., MD;  [facility];  Service: Endoscopy;  Laterality: N/A;  . ESOPHAGOGASTRODUODENOSCOPY N/A 02/16/2012   Procedure: ESOPHAGOGASTRODUODENOSCOPY (EGD);  Surgeon: Vertell NovakJames L Edwards Jr., MD;  [facility];  Service: Endoscopy;  Laterality: N/A;  . THYROIDECTOMY     in 2000     reports that  has never smoked. she has never used smokeless tobacco. She reports that she does not drink alcohol or use drugs.  No Known Allergies  Family History  Problem Relation Age of Onset  . Cancer Sister        brain  . Diabetes Son   . Hypertension Son   . Diabetes Daughter     Prior to Admission  medications   Medication Sig Start Date End Date Taking? Authorizing Provider  alendronate (FOSAMAX) 70 MG tablet Take 70 mg by mouth every 7 (seven) days. Take with a full glass of water on an empty stomach.   Yes [provider]  calcium-vitamin D (OSCAL WITH D) 500-200 MG-UNIT per tablet Take 1 tablet by mouth daily. 02/17/12  Yes Rai, Ripudeep K, MD  cetirizine (ZYRTEC) 10 MG tablet Take 10 mg by mouth daily.   Yes [provider]  donepezil (ARICEPT) 10 MG tablet Take 10 mg by mouth at bedtime.   Yes [provider]  glimepiride (AMARYL) 4 MG tablet Take 4 mg by mouth daily with breakfast.    Yes [provider]  insulin glargine (LANTUS) 100 UNIT/ML injection Inject 15 Units into the skin at bedtime.   Yes [provider]  linagliptin (TRADJENTA) 5 MG TABS tablet Take 5 mg by mouth daily.   Yes [provider]  losartan (COZAAR) 100 MG tablet Take 100 mg by mouth daily.   Yes [provider]  memantine (NAMENDA XR) 14 MG CP24 24 hr capsule Take 14 mg by mouth daily.   Yes [provider]  metFORMIN (GLUCOPHAGE-XR) 500 MG 24 hr tablet Take 500 mg by mouth every evening.   Yes [provider]  metoprolol succinate (TOPROL-XL) 25 MG 24 hr tablet Take 25 mg by mouth daily.   Yes [provider]  omeprazole (PRILOSEC) 20 MG capsule Take 20 mg  by mouth daily.   Yes [provider]  calcium citrate-vitamin D (CITRACAL+D) 315-200 MG-UNIT per tablet Take 1 tablet by mouth daily.     [provider]  docusate sodium 100 MG CAPS Take 100 mg by mouth 2 (two) times daily as needed for constipation. Patient not taking: Reported on 11/25/2015 02/17/12   Rai, Delene Ruffini, MD  donepezil (ARICEPT) 5 MG tablet Take 1 tablet (5 mg total) by mouth at bedtime. Patient not taking: Reported on 03/09/2017 06/06/11   Floydene Flock, MD  haloperidol (HALDOL) 0.5 MG tablet Take 0.5 mg by mouth at bedtime.    [provider]  JENTADUETO 2.05-998 MG TABS  07/19/13   [provider]  metFORMIN (GLUCOPHAGE) 500 MG tablet Take 1,000 mg by mouth 2 (two) times daily with a meal.    [provider]  mirtazapine (REMERON) 15 MG tablet Take 15 mg by mouth at bedtime.    [provider]  NAMENDA XR 28 MG CP24 Take 28 mg by mouth daily after breakfast.  09/02/13   [provider]  omeprazole (PRILOSEC) 40 MG capsule Take 1 capsule (40 mg total) by mouth daily. Patient not taking: Reported on 03/09/2017 06/06/11   Floydene Flock, MD  valsartan (DIOVAN) 160 MG tablet  09/09/13   [provider]    Physical Exam: Vitals:   03/09/17 2330 03/10/17 0000 03/10/17 0030 03/10/17 0100  BP: (!) 175/86 (!) 149/76 (!) 171/101 (!) 176/81  Pulse: 80 70 79 72  Resp: 17 14 16 14   Temp:      TempSrc:      SpO2: 98% 100% 99% 99%  Weight:      Height:        Constitutional: comfortable, but with significant coughing appears wet Vitals:   03/09/17 2330 03/10/17 0000 03/10/17 0030 03/10/17 0100  BP: (!) 175/86 (!) 149/76 (!) 171/101 (!) 176/81  Pulse: 80 70 79 72  Resp: 17 14 16 14   Temp:      TempSrc:      SpO2: 98% 100% 99% 99%  Weight:      Height:       Eyes: PERRL, lids and conjunctivae normal ENMT: Mucous membranes are moist. Neck: normal, supple, no masses, no thyromegaly Respiratory: clear to auscultation bilaterally, no wheezing, no crackles. Normal respiratory effort. No accessory muscle use.  On room air Cardiovascular: Regular rate and rhythm, no murmurs / rubs / gallops. No extremity edema. 2+ pedal pulses. No carotid bruits.  Abdomen: no tenderness, no masses palpated. No hepatosplenomegaly. Bowel sounds positive.  Musculoskeletal: no clubbing / cyanosis. No joint deformity upper and lower extremities. Good ROM, no contractures. Normal muscle tone.  Skin: no rashes, lesions, ulcers. No induration Neurologic: Moving extremities spontaneously, unable to fully  assess.  Psychiatric:  Alert and oriented x 1.    Labs on Admission: I have personally reviewed following labs and imaging studies  CBC: Recent Labs  Lab 03/09/17 1927  WBC 8.8  NEUTROABS 4.4  HGB 12.6  HCT 37.9  MCV 93.1  PLT 223   Basic Metabolic Panel: Recent Labs  Lab 03/09/17 1927  NA 140  K 4.0  CL 105  CO2 24  GLUCOSE 97  BUN 17  CREATININE 0.86  CALCIUM 9.6   Liver Function Tests: Recent Labs  Lab 03/09/17 1927  AST 42*  ALT 33  ALKPHOS 73  BILITOT 0.5  PROT 7.4  ALBUMIN 3.9   Urine analysis:  Component Value Date/Time   COLORURINE YELLOW 03/09/2017 2109   APPEARANCEUR CLEAR 03/09/2017 2109   LABSPEC 1.017 03/09/2017 2109   PHURINE 5.0 03/09/2017 2109   GLUCOSEU NEGATIVE 03/09/2017 2109   HGBUR NEGATIVE 03/09/2017 2109   HGBUR negative 01/28/2009 1523   BILIRUBINUR NEGATIVE 03/09/2017 2109   BILIRUBINUR NEG 04/04/2011 0903   KETONESUR NEGATIVE 03/09/2017 2109   PROTEINUR NEGATIVE 03/09/2017 2109   UROBILINOGEN 0.2 04/04/2011 0903   UROBILINOGEN 0.2 01/28/2009 1523   NITRITE NEGATIVE 03/09/2017 2109   LEUKOCYTESUR NEGATIVE 03/09/2017 2109    Radiological Exams on Admission: Dg Chest 2 View  Result Date: 03/09/2017 CLINICAL DATA:  Initial evaluation for acute cough. EXAM: CHEST - 2 VIEW COMPARISON:  Prior radiograph from 07/10/2016. FINDINGS: Mild cardiomegaly, stable. Mediastinal silhouette within normal limits. Trach air column bowed to the right about the aortic knob, stable. Lungs are hypoinflated. Patchy and hazy bibasilar opacities, right greater than left. Atelectasis is favored, although superimposed infiltrates could be considered in the correct clinical setting. No pulmonary edema or pleural effusion. No pneumothorax. No acute osseous abnormality. IMPRESSION: Shallow lung inflation with bibasilar airspace opacities. Atelectasis is favored, although infiltrate could be considered in the correct clinical setting, particularly at the right  lung base. Electronically Signed   By: Rise Mu M.D.   On: 03/09/2017 22:43    EKG: None  Assessment/Plan Principal Problem:   HCAP (healthcare-associated pneumonia) Active Problems:   Diabetes mellitus out of control (HCC)   Dementia, Alzheimer's, with behavior disturbance   Essential hypertension  HCAP- Cough, reported SOB, nursing home resident. No O2 requirement. WBC- 8.8.  Chest x-ray-favors atelectasis versus infection, bibasilar, particularly right lower lobe. Does not meet sepsis criteria. Temp- 99.1.  -Will continue broad-spectrum antibiotics IV Vanco and Zosyn started in the ED -Check influenza -Swallow evaluation - Pt eval  DM- serum glucose- 97. - Hold home lantus, metformin, linagliptin, glimeperide - SSi  Dementia, depression-  - Cont home memantin, haldol, donepezil  HTN- elevated. - resume home lorsatan, metop   DVT prophylaxis: Lovenox Code Status:  Assume full Family Communication: None at bedside Disposition Plan: ~1-2 days, nursing home resident Consults called: None Admission status: inpt, med surg   Onnie Boer MD [facility] Pager [phone] From 6PM-2AM.  Otherwise please check [email or web address] [email or web address] Password [email or web address]  03/10/2017, 2:04 AM

## 2017-03-10 NOTE — Progress Notes (Signed)
Pharmacy Antibiotic Note  Michaela BridgeMary H [name] is a [age] y.o. female admitted on 03/09/2017 with pneumonia.  Pharmacy has been consulted for cefepime and vancomycin dosing.  PMH is  significant for dementia, depression, hepatitis, DM, HTN, was brought to the ED with reports of abdominal pain and decreased urine output since Tuesday- now 3 days prior.    Plan:  Vancomycin 1000 mg IV x1, then vancomycin 750 mg IV q24h   Cefepime 2 gr IV q24h  Monitor clinical course, renal function, cultures as available    Height: 5\' 1"  (154.9 cm) Weight: 125 lb (56.7 kg) IBW/kg (Calculated) : 47.8  Temp (24hrs), Avg:99.1 F (37.3 C), Min:99.1 F (37.3 C), Max:99.1 F (37.3 C)  Recent Labs  Lab 03/09/17 1927  WBC 8.8  CREATININE 0.86    Estimated Creatinine Clearance: 40.7 mL/min (by C-G formula based on SCr of 0.86 mg/dL).    No Known Allergies  Antimicrobials this admission: 3/7 vancomycin >>  3/7 cefepime >>   Dose adjustments this admission: ---  Microbiology results: 3/8 UCx: sent 3/8 influenza panel: in process    Thank you for allowing pharmacy to be a part of this patient's care.   Adalberto ColeNikola Shontel Santee, PharmD, BCPS Pager [phone] 03/10/2017 3:12 AM

## 2017-03-10 NOTE — Discharge Summary (Signed)
Physician Discharge Summary  [name]  JXB:[id number]  DOB: [birth year]  DOA: 03/09/2017 PCP: Laurann Montana, MD  Admit date: 03/09/2017 Discharge date: 03/10/2017  Admitted From: ALF Disposition: ALF  Recommendations for Outpatient Follow-up:  1. Follow up with PCP in 1-2 weeks 2. Please obtain BMP/CBC in 1-2 weeks  Discharge Condition: Stable CODE STATUS: Full code Diet recommendation: Heart Healthy / Carb Modified   Brief/Interim Summary: For full details see H&P/Progress note, but in brief, [name] is a 77 year old female with medical history significant for dementia, depression, anxiety, hepatitis, diabetes mellitus type 2 and hypertension presented to the emergency department after having decreasing urine output and abdominal pain.  Upon initial evaluation BP was found to be elevated, chest x-ray showed bibasilar opacities concerning for atelectasis versus pneumonia.  Patient was admitted with working diagnosis of HCAP started on empiric IV antibiotics. Patient has been afebrile, white count is negative, repeat chest x-rays did not show any signs of pneumonia and pro-calcitonin was less than 0.10.  Clinical picture not consistent with pneumonia, therefore antibiotics were discontinued.  Patient has clinically improved, denies abdominal pain and she has good urinary output.  Tolerating diet well and remains afebrile.  Patient deemed stable for discharge and follow-up as an outpatient.  Subjective: Patient seen and examined, has no complaints today.  She denies cough, chest pain, shortness of breath and palpitations.  Discharge Diagnoses/Hospital Course:  HCAP ruled out Patient initially treated for possible healthcare associated pneumonia given cough and ?  Shortness of breath.  Chest x-ray was questionable between atelectasis versus pneumonia in the right lung base.  Clinically patient did not have any rales, was not hypoxic, white count was normal and she is afebrile. On  repeating chest x-ray no signs of pneumonia were found. Pro-calcitonin is negative.  Flu PCR negative.  Antibiotic has been discontinued and she can follow-up with PCP in 1 week.  Abdominal pain - resolved Felt to be related to elevated blood pressure No signs of infection, exam is benign.  Patient without nausea, vomiting, constipation or diarrhea. Tolerating diet well Monitored as an outpatient  Diabetes mellitus type 2 CBGs stable during hospital stay Continue home medication with no changes  Hypertension BP stable No changes in medications were made  Dementia Continue Namenda and Aricept  All other chronic medical condition were stable during the hospitalization.  On the day of the discharge the patient's vitals were stable, and no other acute medical condition were reported by patient. the patient was felt safe to be discharge to ALF  Discharge Instructions  You were cared for by a hospitalist during your hospital stay. If you have any questions about your discharge medications or the care you received while you were in the hospital after you are discharged, you can call the unit and asked to speak with the hospitalist on call if the hospitalist that took care of you is not available. Once you are discharged, your primary care physician will handle any further medical issues. Please note that NO REFILLS for any discharge medications will be authorized once you are discharged, as it is imperative that you return to your primary care physician (or establish a relationship with a primary care physician if you do not have one) for your aftercare needs so that they can reassess your need for medications and monitor your lab values.  Discharge Instructions    Call MD for:  difficulty breathing, headache or visual disturbances   Complete by:  As directed  Call MD for:  extreme fatigue   Complete by:  As directed    Call MD for:  hives   Complete by:  As directed    Call MD for:   persistant dizziness or light-headedness   Complete by:  As directed    Call MD for:  persistant nausea and vomiting   Complete by:  As directed    Call MD for:  redness, tenderness, or signs of infection (pain, swelling, redness, odor or green/yellow discharge around incision site)   Complete by:  As directed    Call MD for:  severe uncontrolled pain   Complete by:  As directed    Call MD for:  temperature >100.4   Complete by:  As directed    Diet - low sodium heart healthy   Complete by:  As directed    Increase activity slowly   Complete by:  As directed      Allergies as of 03/10/2017   No Known Allergies     Medication List    STOP taking these medications   calcium citrate-vitamin D 315-200 MG-UNIT tablet Commonly known as:  CITRACAL+D   DSS 100 MG Caps   JENTADUETO 2.05-998 MG Tabs Generic drug:  Linagliptin-metFORMIN HCl   valsartan 160 MG tablet Commonly known as:  DIOVAN     TAKE these medications   alendronate 70 MG tablet Commonly known as:  FOSAMAX Take 70 mg by mouth every 7 (seven) days. Take with a full glass of water on an empty stomach.   AMARYL 4 MG tablet Generic drug:  glimepiride Take 4 mg by mouth daily with breakfast.   calcium-vitamin D 500-200 MG-UNIT tablet Commonly known as:  OSCAL WITH D Take 1 tablet by mouth daily.   cetirizine 10 MG tablet Commonly known as:  ZYRTEC Take 10 mg by mouth daily.   donepezil 5 MG tablet Commonly known as:  ARICEPT Take 1 tablet (5 mg total) by mouth at bedtime. What changed:  Another medication with the same name was removed. Continue taking this medication, and follow the directions you see here.   guaiFENesin-codeine 100-10 MG/5ML syrup Take 5 mLs by mouth every 12 (twelve) hours as needed for up to 3 days for cough.   haloperidol 0.5 MG tablet Commonly known as:  HALDOL Take 0.5 mg by mouth at bedtime.   insulin glargine 100 UNIT/ML injection Commonly known as:  LANTUS Inject 15 Units  into the skin at bedtime.   linagliptin 5 MG Tabs tablet Commonly known as:  TRADJENTA Take 5 mg by mouth daily.   losartan 100 MG tablet Commonly known as:  COZAAR Take 100 mg by mouth daily.   metFORMIN 500 MG 24 hr tablet Commonly known as:  GLUCOPHAGE-XR Take 500 mg by mouth every evening. What changed:  Another medication with the same name was removed. Continue taking this medication, and follow the directions you see here.   metoprolol succinate 25 MG 24 hr tablet Commonly known as:  TOPROL-XL Take 25 mg by mouth daily.   mirtazapine 15 MG tablet Commonly known as:  REMERON Take 15 mg by mouth at bedtime.   NAMENDA XR 28 MG Cp24 24 hr capsule Generic drug:  memantine Take 28 mg by mouth daily after breakfast. What changed:  Another medication with the same name was removed. Continue taking this medication, and follow the directions you see here.   omeprazole 40 MG capsule Commonly known as:  PRILOSEC Take 1 capsule (40 mg total)  by mouth daily. What changed:  Another medication with the same name was removed. Continue taking this medication, and follow the directions you see here.      Follow-up Information    Laurann Montana, MD. Schedule an appointment as soon as possible for a visit in 1 week(s).   Specialty:  Family Medicine Why:  Hospital follow-up Contact information: [address], [address] [place] [address] [phone]          No Known Allergies  Consultations:  None   Procedures/Studies: Dg Chest 2 View  Result Date: 03/09/2017 CLINICAL DATA:  Initial evaluation for acute cough. EXAM: CHEST - 2 VIEW COMPARISON:  Prior radiograph from 07/10/2016. FINDINGS: Mild cardiomegaly, stable. Mediastinal silhouette within normal limits. Trach air column bowed to the right about the aortic knob, stable. Lungs are hypoinflated. Patchy and hazy bibasilar opacities, right greater than left. Atelectasis is favored, although superimposed infiltrates  could be considered in the correct clinical setting. No pulmonary edema or pleural effusion. No pneumothorax. No acute osseous abnormality. IMPRESSION: Shallow lung inflation with bibasilar airspace opacities. Atelectasis is favored, although infiltrate could be considered in the correct clinical setting, particularly at the right lung base. Electronically Signed   By: Rise Mu M.D.   On: 03/09/2017 22:43   Dg Chest Port 1 View  Result Date: 03/10/2017 CLINICAL DATA:  Abdominal pain and decreased urine output for 3 days. EXAM: PORTABLE CHEST 1 VIEW COMPARISON:  Chest x-rays dated 03/09/2017 and 07/10/2016 FINDINGS: Heart size and mediastinal contours are stable. Prominence of the right paratracheal mediastinum, suspected thyroid goiter. Lungs are clear. No pleural effusion or pneumothorax seen. No acute or suspicious osseous finding. IMPRESSION: 1. No active disease.  No evidence of pneumonia on today's exam. 2. **An incidental finding of potential clinical significance has been found. Prominence of the right paratracheal mediastinum, suspected thyroid goiter. Consider confirmation at some point with nonemergent thyroid ultrasound or chest CT.** Electronically Signed   By: Bary Richard M.D.   On: 03/10/2017 12:20    Discharge Exam: Vitals:   03/10/17 0348 03/10/17 1221  BP: (!) 177/91 (!) 149/77  Pulse: 70 75  Resp: 18   Temp: 98.6 F (37 C)   SpO2: 98%    Vitals:   03/10/17 0100 03/10/17 0230 03/10/17 0348 03/10/17 1221  BP: (!) 176/81 (!) 153/86 (!) 177/91 (!) 149/77  Pulse: 72 76 70 75  Resp: 14 15 18    Temp:   98.6 F (37 C)   TempSrc:   Oral   SpO2: 99% 100% 98%   Weight:   59.3 kg (130 lb 11.7 oz)   Height:        General: Pt is alert, awake, not in acute distress Cardiovascular: RRR, S1/S2 +, no rubs, no gallops Respiratory: CTA bilaterally, no wheezing, no rhonchi Abdominal: Soft, NT, ND, bowel sounds + Extremities: no edema   The results of significant  diagnostics from this hospitalization (including imaging, microbiology, ancillary and laboratory) are listed below for reference.     Microbiology: No results found for this or any previous visit (from the past 240 hour(s)).   Labs: BNP (last 3 results) No results for input(s): BNP in the last 8760 hours. Basic Metabolic Panel: Recent Labs  Lab 03/09/17 1927  NA 140  K 4.0  CL 105  CO2 24  GLUCOSE 97  BUN 17  CREATININE 0.86  CALCIUM 9.6   Liver Function Tests: Recent Labs  Lab 03/09/17 1927  AST 42*  ALT 33  ALKPHOS 73  BILITOT 0.5  PROT 7.4  ALBUMIN 3.9   No results for input(s): LIPASE, AMYLASE in the last 168 hours. No results for input(s): AMMONIA in the last 168 hours. CBC: Recent Labs  Lab 03/09/17 1927  WBC 8.8  NEUTROABS 4.4  HGB 12.6  HCT 37.9  MCV 93.1  PLT 223   Cardiac Enzymes: No results for input(s): CKTOTAL, CKMB, CKMBINDEX, TROPONINI in the last 168 hours. BNP: Invalid input(s): POCBNP CBG: Recent Labs  Lab 03/10/17 0808 03/10/17 1153  GLUCAP 95 85   D-Dimer No results for input(s): DDIMER in the last 72 hours. Hgb A1c No results for input(s): HGBA1C in the last 72 hours. Lipid Profile No results for input(s): CHOL, HDL, LDLCALC, TRIG, CHOLHDL, LDLDIRECT in the last 72 hours. Thyroid function studies No results for input(s): TSH, T4TOTAL, T3FREE, THYROIDAB in the last 72 hours.  Invalid input(s): FREET3 Anemia work up No results for input(s): VITAMINB12, FOLATE, FERRITIN, TIBC, IRON, RETICCTPCT in the last 72 hours. Urinalysis    Component Value Date/Time   COLORURINE YELLOW 03/09/2017 2109   APPEARANCEUR CLEAR 03/09/2017 2109   LABSPEC 1.017 03/09/2017 2109   PHURINE 5.0 03/09/2017 2109   GLUCOSEU NEGATIVE 03/09/2017 2109   HGBUR NEGATIVE 03/09/2017 2109   HGBUR negative 01/28/2009 1523   BILIRUBINUR NEGATIVE 03/09/2017 2109   BILIRUBINUR NEG 04/04/2011 0903   KETONESUR NEGATIVE 03/09/2017 2109   PROTEINUR NEGATIVE  03/09/2017 2109   UROBILINOGEN 0.2 04/04/2011 0903   UROBILINOGEN 0.2 01/28/2009 1523   NITRITE NEGATIVE 03/09/2017 2109   LEUKOCYTESUR NEGATIVE 03/09/2017 2109   Sepsis Labs Invalid input(s): PROCALCITONIN,  WBC,  LACTICIDVEN Microbiology No results found for this or any previous visit (from the past 240 hour(s)).   Time coordinating discharge: 32 minutes  SIGNED:  Latrelle DodrillEdwin Silva, MD  [facility] 03/10/2017, 2:45 PM  Pager please text page via  [email or web address]  Note - This record has been created using [employer] software. Chart creation errors have been sought, but may not always have been located. Such creation errors do not reflect on the standard of medical care.

## 2017-03-11 LAB — URINE CULTURE: CULTURE: NO GROWTH

## 2017-04-22 ENCOUNTER — Encounter (HOSPITAL_COMMUNITY): Payer: Self-pay | Admitting: Emergency Medicine

## 2017-04-22 ENCOUNTER — Observation Stay (HOSPITAL_COMMUNITY)
Admission: EM | Admit: 2017-04-22 | Discharge: 2017-04-23 | Disposition: A | Discharge status: Other Acute Inpatient Hospital | Payer: Medicare Other | Attending: Internal Medicine | Admitting: Internal Medicine

## 2017-04-22 DIAGNOSIS — F02818 Dementia in other diseases classified elsewhere, unspecified severity, with other behavioral disturbance: Secondary | ICD-10-CM | POA: Diagnosis present

## 2017-04-22 DIAGNOSIS — F329 Major depressive disorder, single episode, unspecified: Secondary | ICD-10-CM | POA: Diagnosis not present

## 2017-04-22 DIAGNOSIS — E785 Hyperlipidemia, unspecified: Secondary | ICD-10-CM | POA: Diagnosis not present

## 2017-04-22 DIAGNOSIS — X58XXXA Exposure to other specified factors, initial encounter: Secondary | ICD-10-CM | POA: Insufficient documentation

## 2017-04-22 DIAGNOSIS — Z79899 Other long term (current) drug therapy: Secondary | ICD-10-CM | POA: Insufficient documentation

## 2017-04-22 DIAGNOSIS — I10 Essential (primary) hypertension: Secondary | ICD-10-CM

## 2017-04-22 DIAGNOSIS — G309 Alzheimer's disease, unspecified: Secondary | ICD-10-CM

## 2017-04-22 DIAGNOSIS — E119 Type 2 diabetes mellitus without complications: Secondary | ICD-10-CM | POA: Diagnosis not present

## 2017-04-22 DIAGNOSIS — E1165 Type 2 diabetes mellitus with hyperglycemia: Secondary | ICD-10-CM | POA: Diagnosis present

## 2017-04-22 DIAGNOSIS — F0281 Dementia in other diseases classified elsewhere with behavioral disturbance: Secondary | ICD-10-CM | POA: Diagnosis not present

## 2017-04-22 DIAGNOSIS — Z7983 Long term (current) use of bisphosphonates: Secondary | ICD-10-CM | POA: Insufficient documentation

## 2017-04-22 DIAGNOSIS — T783XXA Angioneurotic edema, initial encounter: Principal | ICD-10-CM | POA: Diagnosis present

## 2017-04-22 DIAGNOSIS — Z794 Long term (current) use of insulin: Secondary | ICD-10-CM | POA: Insufficient documentation

## 2017-04-22 DIAGNOSIS — IMO0002 Reserved for concepts with insufficient information to code with codable children: Secondary | ICD-10-CM | POA: Diagnosis present

## 2017-04-22 LAB — CBC WITH DIFFERENTIAL/PLATELET
BASOS PCT: 0 %
Basophils Absolute: 0 10*3/uL (ref 0.0–0.1)
EOS ABS: 0.2 10*3/uL (ref 0.0–0.7)
Eosinophils Relative: 3 %
HEMATOCRIT: 37.6 % (ref 36.0–46.0)
HEMOGLOBIN: 12 g/dL (ref 12.0–15.0)
Lymphocytes Relative: 34 %
Lymphs Abs: 2 10*3/uL (ref 0.7–4.0)
MCH: 30.2 pg (ref 26.0–34.0)
MCHC: 31.9 g/dL (ref 30.0–36.0)
MCV: 94.7 fL (ref 78.0–100.0)
MONO ABS: 0.4 10*3/uL (ref 0.1–1.0)
MONOS PCT: 6 %
NEUTROS ABS: 3.3 10*3/uL (ref 1.7–7.7)
NEUTROS PCT: 57 %
Platelets: 197 10*3/uL (ref 150–400)
RBC: 3.97 MIL/uL (ref 3.87–5.11)
RDW: 13.1 % (ref 11.5–15.5)
WBC: 5.8 10*3/uL (ref 4.0–10.5)

## 2017-04-22 LAB — BASIC METABOLIC PANEL
ANION GAP: 10 (ref 5–15)
BUN: 12 mg/dL (ref 6–20)
CALCIUM: 9.2 mg/dL (ref 8.9–10.3)
CO2: 27 mmol/L (ref 22–32)
Chloride: 104 mmol/L (ref 101–111)
Creatinine, Ser: 0.97 mg/dL (ref 0.44–1.00)
GFR, EST NON AFRICAN AMERICAN: 55 mL/min — AB (ref >60)
Glucose, Bld: 112 mg/dL — ABNORMAL HIGH (ref 65–99)
Potassium: 3.7 mmol/L (ref 3.5–5.1)
Sodium: 141 mmol/L (ref 135–145)

## 2017-04-22 LAB — GLUCOSE, CAPILLARY
GLUCOSE-CAPILLARY: 170 mg/dL — AB (ref 65–99)
GLUCOSE-CAPILLARY: 223 mg/dL — AB (ref 65–99)

## 2017-04-22 MED ORDER — INSULIN ASPART 100 UNIT/ML ~~LOC~~ SOLN
0.0000 [IU] | Freq: Three times a day (TID) | SUBCUTANEOUS | Status: DC
  Administered 2017-04-22 – 2017-04-23 (×3): 2 [IU] via SUBCUTANEOUS

## 2017-04-22 MED ORDER — METHYLPREDNISOLONE SODIUM SUCC 40 MG IJ SOLR
40.0000 mg | Freq: Three times a day (TID) | INTRAMUSCULAR | Status: DC
  Administered 2017-04-22 – 2017-04-23 (×3): 40 mg via INTRAVENOUS
  Filled 2017-04-22 (×3): qty 1

## 2017-04-22 MED ORDER — FAMOTIDINE 20 MG PO TABS
20.0000 mg | ORAL_TABLET | Freq: Two times a day (BID) | ORAL | Status: DC
  Administered 2017-04-22 – 2017-04-23 (×2): 20 mg via ORAL
  Filled 2017-04-22 (×2): qty 1

## 2017-04-22 MED ORDER — METHYLPREDNISOLONE SODIUM SUCC 125 MG IJ SOLR
60.0000 mg | Freq: Once | INTRAMUSCULAR | Status: AC
  Administered 2017-04-22: 60 mg via INTRAVENOUS
  Filled 2017-04-22: qty 2

## 2017-04-22 MED ORDER — DIPHENHYDRAMINE HCL 50 MG/ML IJ SOLN: 12.5000 mg | Freq: Two times a day (BID) | INTRAMUSCULAR | Status: DC

## 2017-04-22 MED ORDER — LINAGLIPTIN 5 MG PO TABS
5.0000 mg | ORAL_TABLET | Freq: Every day | ORAL | Status: DC
  Administered 2017-04-23: 5 mg via ORAL
  Filled 2017-04-22: qty 1

## 2017-04-22 MED ORDER — INSULIN GLARGINE 100 UNIT/ML ~~LOC~~ SOLN
15.0000 [IU] | Freq: Every day | SUBCUTANEOUS | Status: DC
  Administered 2017-04-22: 15 [IU] via SUBCUTANEOUS
  Filled 2017-04-22 (×2): qty 0.15

## 2017-04-22 MED ORDER — ACETAMINOPHEN 325 MG PO TABS: 650.0000 mg | ORAL_TABLET | Freq: Four times a day (QID) | ORAL | Status: DC | PRN

## 2017-04-22 MED ORDER — ACETAMINOPHEN 650 MG RE SUPP: 650.0000 mg | Freq: Four times a day (QID) | RECTAL | Status: DC | PRN

## 2017-04-22 MED ORDER — METOPROLOL SUCCINATE ER 25 MG PO TB24
25.0000 mg | ORAL_TABLET | Freq: Every day | ORAL | Status: DC
  Administered 2017-04-22 – 2017-04-23 (×2): 25 mg via ORAL
  Filled 2017-04-22 (×2): qty 1

## 2017-04-22 MED ORDER — POLYETHYLENE GLYCOL 3350 17 G PO PACK: 17.0000 g | PACK | Freq: Every day | ORAL | Status: DC | PRN

## 2017-04-22 MED ORDER — DIPHENHYDRAMINE HCL 25 MG PO CAPS
25.0000 mg | ORAL_CAPSULE | Freq: Two times a day (BID) | ORAL | Status: DC
  Administered 2017-04-22 – 2017-04-23 (×2): 25 mg via ORAL
  Filled 2017-04-22 (×2): qty 1

## 2017-04-22 MED ORDER — METOPROLOL SUCCINATE ER 25 MG PO TB24: 25.0000 mg | ORAL_TABLET | Freq: Every day | ORAL | Status: DC

## 2017-04-22 MED ORDER — BISACODYL 10 MG RE SUPP: 10.0000 mg | Freq: Every day | RECTAL | Status: DC | PRN

## 2017-04-22 MED ORDER — FAMOTIDINE IN NACL 20-0.9 MG/50ML-% IV SOLN
20.0000 mg | Freq: Once | INTRAVENOUS | Status: AC
  Administered 2017-04-22: 20 mg via INTRAVENOUS
  Filled 2017-04-22: qty 50

## 2017-04-22 MED ORDER — ENOXAPARIN SODIUM 40 MG/0.4ML ~~LOC~~ SOLN
40.0000 mg | SUBCUTANEOUS | Status: DC
  Administered 2017-04-22: 40 mg via SUBCUTANEOUS
  Filled 2017-04-22: qty 0.4

## 2017-04-22 MED ORDER — HALOPERIDOL 0.5 MG PO TABS
0.5000 mg | ORAL_TABLET | Freq: Every day | ORAL | Status: DC
  Administered 2017-04-22: 0.5 mg via ORAL
  Filled 2017-04-22 (×2): qty 1

## 2017-04-22 MED ORDER — GLIMEPIRIDE 4 MG PO TABS
4.0000 mg | ORAL_TABLET | Freq: Every day | ORAL | Status: DC
  Administered 2017-04-23: 4 mg via ORAL
  Filled 2017-04-22: qty 1

## 2017-04-22 MED ORDER — SODIUM CHLORIDE 0.9 % IV SOLN
INTRAVENOUS | Status: AC
  Administered 2017-04-22: 18:00:00 via INTRAVENOUS

## 2017-04-22 MED ORDER — MIRTAZAPINE 15 MG PO TABS
15.0000 mg | ORAL_TABLET | Freq: Every day | ORAL | Status: DC
  Administered 2017-04-22: 15 mg via ORAL
  Filled 2017-04-22 (×2): qty 1

## 2017-04-22 MED ORDER — MEMANTINE HCL ER 28 MG PO CP24
28.0000 mg | ORAL_CAPSULE | Freq: Every day | ORAL | Status: DC
  Administered 2017-04-23: 28 mg via ORAL
  Filled 2017-04-22: qty 1

## 2017-04-22 MED ORDER — AMLODIPINE BESYLATE 5 MG PO TABS
2.5000 mg | ORAL_TABLET | Freq: Every day | ORAL | Status: DC
  Administered 2017-04-22 – 2017-04-23 (×2): 2.5 mg via ORAL
  Filled 2017-04-22 (×2): qty 1

## 2017-04-22 MED ORDER — METFORMIN HCL ER 500 MG PO TB24
500.0000 mg | ORAL_TABLET | Freq: Every evening | ORAL | Status: DC
  Administered 2017-04-22: 500 mg via ORAL
  Filled 2017-04-22: qty 1

## 2017-04-22 NOTE — ED Notes (Signed)
ED Provider at bedside. 

## 2017-04-22 NOTE — ED Notes (Signed)
Patient has dementia at base, does not follow command. Alert and oriented to self.

## 2017-04-22 NOTE — ED Provider Notes (Signed)
[facility] EMERGENCY DEPARTMENT Provider Note   CSN: [id number] Arrival date & time: 04/22/17  1122     History   Chief Complaint Chief Complaint  Patient presents with  . Allergic Reaction    HPI [name] is a 77 y.o. female.  Level 5 caveat secondary to dementia.  Female with history of dementia sent in from her facility [facility] for acute onset of lip swelling.  Patient is not sure why she is here.  Per nurse the report from EMS was that the staff thought she might have eaten something and that her lip started to swell.  She was given 50 mg of Benadryl orally by EMS.  The patient herself denies any trouble speaking or trouble swallowing.  She is not sure if she is ever had this happen to her before.  The history is provided by the patient and the EMS personnel.  Allergic Reaction  Presenting symptoms: swelling   Presenting symptoms: no difficulty breathing, no difficulty swallowing, no itching, no rash and no wheezing   Severity:  Moderate Prior allergic episodes:  No prior episodes Context: food   Relieved by:  Antihistamines Worsened by:  Nothing   Past Medical History:  Diagnosis Date  . Anxiety   . Dementia   . Depression   . Diabetes mellitus   . Hepatitis   . Hyperlipidemia   . Hypertension     Patient Active Problem List   Diagnosis Date Noted  . HCAP (healthcare-associated pneumonia) 03/10/2017  . Anemia 02/14/2012  . GI Bleed 02/14/2012  . Blepharitis of left upper eyelid 04/25/2011  . Cough 12/30/2010  . Right ear pain 11/20/2010  . Urinary frequency 11/20/2010  . Pain in gums 06/07/2010  . Systolic murmur 03/18/2010  . Right thigh pain 03/18/2010  . Urge incontinence 03/18/2010  . Dementia, Alzheimer's, with behavior disturbance 09/30/2009  . OSTEOPOROSIS 09/21/2009  . CONSTIPATION 05/15/2009  . Diabetes mellitus out of control (HCC) 01/12/2009  . HYPERLIPIDEMIA 01/12/2009  . ANXIETY 01/12/2009  . DEPRESSION  01/12/2009  . Essential hypertension 01/12/2009  . THYROIDECTOMY, HX OF 01/12/2009    Past Surgical History:  Procedure Laterality Date  . COLONOSCOPY N/A 02/16/2012   Procedure: COLONOSCOPY;  Surgeon: Vertell Novak., MD;  Location: [facility] ENDOSCOPY;  Service: Endoscopy;  Laterality: N/A;  . ESOPHAGOGASTRODUODENOSCOPY N/A 02/16/2012   Procedure: ESOPHAGOGASTRODUODENOSCOPY (EGD);  Surgeon: Vertell Novak., MD;  Location[facility] ENDOSCOPY;  Service: Endoscopy;  Laterality: N/A;  . THYROIDECTOMY     in 2000     OB History   None      Home Medications    Prior to Admission medications   Medication Sig Start Date End Date Taking? Authorizing Provider  alendronate (FOSAMAX) 70 MG tablet Take 70 mg by mouth every 7 (seven) days. Take with a full glass of water on an empty stomach.    [provider]  calcium-vitamin D (OSCAL WITH D) 500-200 MG-UNIT per tablet Take 1 tablet by mouth daily. 02/17/12   Rai, Delene Ruffini, MD  cetirizine (ZYRTEC) 10 MG tablet Take 10 mg by mouth daily.    [provider]  donepezil (ARICEPT) 5 MG tablet Take 1 tablet (5 mg total) by mouth at bedtime. Patient not taking: Reported on 03/09/2017 06/06/11   Floydene Flock, MD  glimepiride (AMARYL) 4 MG tablet Take 4 mg by mouth daily with breakfast.     [provider]  haloperidol (HALDOL) 0.5 MG tablet Take  0.5 mg by mouth at bedtime.    [provider]  insulin glargine (LANTUS) 100 UNIT/ML injection Inject 15 Units into the skin at bedtime.    [provider]  linagliptin (TRADJENTA) 5 MG TABS tablet Take 5 mg by mouth daily.    [provider]  losartan (COZAAR) 100 MG tablet Take 100 mg by mouth daily.    [provider]  metFORMIN (GLUCOPHAGE-XR) 500 MG 24 hr tablet Take 500 mg by mouth every evening.    [provider]  metoprolol succinate (TOPROL-XL) 25 MG 24 hr tablet Take 25 mg by mouth daily.    [provider]  mirtazapine  (REMERON) 15 MG tablet Take 15 mg by mouth at bedtime.    [provider]  NAMENDA XR 28 MG CP24 Take 28 mg by mouth daily after breakfast.  09/02/13   [provider]  omeprazole (PRILOSEC) 40 MG capsule Take 1 capsule (40 mg total) by mouth daily. Patient not taking: Reported on 03/09/2017 06/06/11   Floydene FlockNewton, Steven J, MD    Family History Family History  Problem Relation Age of Onset  . Cancer Sister        brain  . Diabetes Son   . Hypertension Son   . Diabetes Daughter     Social History Social History   Tobacco Use  . Smoking status: Never Smoker  . Smokeless tobacco: Never Used  Substance Use Topics  . Alcohol use: No    Alcohol/week: 0.0 oz  . Drug use: No     Allergies   Patient has no known allergies.   Review of Systems Review of Systems  Unable to perform ROS: Dementia  Constitutional: Negative for fever.  HENT: Negative for sore throat and trouble swallowing.   Eyes: Negative for pain and visual disturbance.  Respiratory: Negative for shortness of breath and wheezing.   Cardiovascular: Negative for chest pain.  Gastrointestinal: Negative for abdominal pain, diarrhea, nausea and vomiting.  Genitourinary: Negative for dysuria and hematuria.  Musculoskeletal: Negative for back pain and neck pain.  Skin: Negative for itching and rash.  Neurological: Negative for seizures and headaches.     Physical Exam Updated Vital Signs BP (!) 169/97   Pulse 98   Temp 97.9 F (36.6 C) (Oral)   Resp 19   Physical Exam  Constitutional: She appears well-developed and well-nourished. No distress.  HENT:  Head: Normocephalic and atraumatic.  Right Ear: External ear normal.  Left Ear: External ear normal.  Patient has marketed lower lip swelling and some tongue swelling.  It does not appear to be any obvious posterior swelling.  There is no stridor and she has normal phonation.  There is no anterior neck swelling or crepitus.  No neck erythema.  Trachea  midline.  Eyes: Pupils are equal, round, and reactive to light. Conjunctivae and EOM are normal. Right eye exhibits no discharge. Left eye exhibits no discharge.  Neck: Normal range of motion and full passive range of motion without pain. Neck supple. No neck rigidity. No edema and no erythema present.  Cardiovascular: Normal rate and regular rhythm.  No murmur heard. Pulmonary/Chest: Effort normal and breath sounds normal. No respiratory distress.  Abdominal: Soft. There is no tenderness.  Musculoskeletal: She exhibits no edema.  Neurological: She is alert. She has normal strength. She is disoriented (to place and time). No cranial nerve deficit or sensory deficit.  Skin: Skin is warm and dry.  Psychiatric: She has a normal mood and  affect.  Nursing note and vitals reviewed.    ED Treatments / Results  Labs (all labs ordered are listed, but only abnormal results are displayed) Labs Reviewed  BASIC METABOLIC PANEL - Abnormal; Notable for the following components:      Result Value   Glucose, Bld 112 (*)    GFR calc non Af Amer 55 (*)    All other components within normal limits  GLUCOSE, CAPILLARY - Abnormal; Notable for the following components:   Glucose-Capillary 170 (*)    All other components within normal limits  CBC WITH DIFFERENTIAL/PLATELET  BASIC METABOLIC PANEL  CBC    EKG EKG Interpretation  Date/Time:  Saturday April 22 2017 11:35:03 EDT Ventricular Rate:  87 PR Interval:    QRS Duration: 88 QT Interval:  363 QTC Calculation: 437 R Axis:   34 Text Interpretation:  Sinus rhythm Probable left atrial enlargement Baseline wander in lead(s) V6 similar pattern to prior 7/18 Confirmed by Meridee Score [phone]) on 04/22/2017 12:00:41 PM   Radiology No results found.  Procedures Procedures (including critical care time)  Medications Ordered in ED Medications  methylPREDNISolone sodium succinate (SOLU-MEDROL) 125 mg/2 mL injection 60 mg (has no administration  in time range)  famotidine (PEPCID) IVPB 20 mg premix (has no administration in time range)     Initial Impression / Assessment and Plan / ED Course  I have reviewed the triage vital signs and the nursing notes.  Pertinent labs & imaging results that were available during my care of the patient were reviewed by me and considered in my medical decision making (see chart for details).  Clinical Course as of Apr 22 1816  Sat Apr 22, 2017  1433 Reevaluated patient.  No further swelling.  Daughter here now and updated on plan.  Daughter states she had a similar reaction about a year ago and was seen at [facility] long.   [MB]  1524 Discussed with hospitalist Dr. Luberta Robertson who will admit the patient to her service.  Updated family that the patient will be staying overnight.   [MB]    Clinical Course User Index [MB] Terrilee Files, MD    Final Clinical Impressions(s) / ED Diagnoses   Final diagnoses:  Angioedema, initial encounter    ED Discharge Orders    None       Terrilee Files, MD 04/22/17 1818

## 2017-04-22 NOTE — H&P (Signed)
History and Physical:    [name]   WUJ:[id number] DOB: [birth year] DOA: 04/22/2017  Referring MD/provider: Dr Charm BargesButler PCP: Laurann MontanaWhite, Cynthia, MD   Patient coming from: long-term care facility  Chief Complaint: lip swelling  History of Present Illness:   [name] is an 77 y.o. female with past medical history significant for dementia, hypertension and diabetes who was sent from her long-term care facility for lip swelling. Patient herself is unable to provide history. She states she feels fine and does not know why she is here. Patient denies feeling short of breath, denies chest pain, denies any difficulty swallowing and asks why her keep asking her those questions.  ED Course:  The patient was noted to have angioedema. She was treated with steroids and famotidine.  ROS:   ROS   Patient is unable to provide due to dementia.   Past Medical History:   Past Medical History:  Diagnosis Date  . Anxiety   . Dementia   . Depression   . Diabetes mellitus   . Hepatitis   . Hyperlipidemia   . Hypertension     Past Surgical History:   Past Surgical History:  Procedure Laterality Date  . COLONOSCOPY N/A 02/16/2012   Procedure: COLONOSCOPY;  Surgeon: Vertell NovakJames L Edwards Jr., MD;  [facility];  Service: Endoscopy;  Laterality: N/A;  . ESOPHAGOGASTRODUODENOSCOPY N/A 02/16/2012   Procedure: ESOPHAGOGASTRODUODENOSCOPY (EGD);  Surgeon: Vertell NovakJames L Edwards Jr., MD;  [facility];  Service: Endoscopy;  Laterality: N/A;  . THYROIDECTOMY     in 2000    Social History:   Social History   Socioeconomic History  . Marital status: Widowed    Spouse name: Not on file  . Number of children: Not on file  . Years of education: 5212  . Highest education level: Not on file  Occupational History  . Not on file  Social Needs  . Financial resource strain: Not on file  . Food insecurity:    Worry: Not on file    Inability: Not on file  . Transportation needs:   Medical: Not on file    Non-medical: Not on file  Tobacco Use  . Smoking status: Never Smoker  . Smokeless tobacco: Never Used  Substance and Sexual Activity  . Alcohol use: No    Alcohol/week: 0.0 oz  . Drug use: No  . Sexual activity: Never  Lifestyle  . Physical activity:    Days per week: Not on file    Minutes per session: Not on file  . Stress: Not on file  Relationships  . Social connections:    Talks on phone: Not on file    Gets together: Not on file    Attends religious service: Not on file    Active member of club or organization: Not on file    Attends meetings of clubs or organizations: Not on file    Relationship status: Not on file  . Intimate partner violence:    Fear of current or ex partner: Not on file    Emotionally abused: Not on file    Physically abused: Not on file    Forced sexual activity: Not on file  Other Topics Concern  . Not on file  Social History Narrative   Lives with daughter and grand-daughter to help informally supervise pt at home.   Pt also in adult daycare during the week.      Allergies   Patient has no known allergies.  Family history:   Family History  Problem Relation Age of Onset  . Cancer Sister        brain  . Diabetes Son   . Hypertension Son   . Diabetes Daughter     Current Medications:   Prior to Admission medications   Medication Sig Start Date End Date Taking? Authorizing Provider  alendronate (FOSAMAX) 70 MG tablet Take 70 mg by mouth every 7 (seven) days. Take with a full glass of water on an empty stomach.    [provider]  calcium-vitamin D (OSCAL WITH D) 500-200 MG-UNIT per tablet Take 1 tablet by mouth daily. 02/17/12   Rai, Delene Ruffini, MD  cetirizine (ZYRTEC) 10 MG tablet Take 10 mg by mouth daily.    [provider]  donepezil (ARICEPT) 5 MG tablet Take 1 tablet (5 mg total) by mouth at bedtime. Patient not taking: Reported on 03/09/2017 06/06/11   Floydene Flock, MD  glimepiride  (AMARYL) 4 MG tablet Take 4 mg by mouth daily with breakfast.     [provider]  haloperidol (HALDOL) 0.5 MG tablet Take 0.5 mg by mouth at bedtime.    [provider]  insulin glargine (LANTUS) 100 UNIT/ML injection Inject 15 Units into the skin at bedtime.    [provider]  linagliptin (TRADJENTA) 5 MG TABS tablet Take 5 mg by mouth daily.    [provider]  losartan (COZAAR) 100 MG tablet Take 100 mg by mouth daily.    [provider]  metFORMIN (GLUCOPHAGE-XR) 500 MG 24 hr tablet Take 500 mg by mouth every evening.    [provider]  metoprolol succinate (TOPROL-XL) 25 MG 24 hr tablet Take 25 mg by mouth daily.    [provider]  mirtazapine (REMERON) 15 MG tablet Take 15 mg by mouth at bedtime.    [provider]  NAMENDA XR 28 MG CP24 Take 28 mg by mouth daily after breakfast.  09/02/13   [provider]  omeprazole (PRILOSEC) 40 MG capsule Take 1 capsule (40 mg total) by mouth daily. Patient not taking: Reported on 03/09/2017 06/06/11   Floydene Flock, MD    Physical Exam:   Vitals:   04/22/17 1144 04/22/17 1300 04/22/17 1628 04/22/17 1742  BP: (!) 169/97 (!) 165/82 (!) 170/99 (!) 176/95  Pulse:   89 83  Resp:   17 16  Temp:    (!) 100.5 F (38.1 C)  TempSrc:    Oral  SpO2:   100% 97%     Physical Exam: Blood pressure (!) 176/95, pulse 83, temperature (!) 100.5 F (38.1 C), temperature source Oral, resp. rate 16, SpO2 97 %. Gen: patient with markedly swollen lower lip sitting up in bed in no respiratory or any other distress. HEENT: Sclerae anicteric. Conjunctiva mildly injected. Patient has a markedly swollen lower lip left side greater than right. She does not appear to have any swelling of her tongue. Uvula is midline and not swollen. She's able to speak in a normal tone of voice. There is no pooling of saliva in her oropharynx. Chest: Moderately good air entry bilaterally with no  adventitious sounds. No wheezes CV: Distant, regular, no audible murmurs. Abdomen: NABS, soft, nondistended, nontender. No tenderness to light or deep palpation. No rebound, no guarding. Extremities: No edema.  Skin: Warm and dry. No rashes, lesions or wounds. Neuro: patient has markedly dementia but is conversant and able to answer questions but accuracy is unclear. Psych: Patient  is cooperative.  Data Review:    Labs: Basic Metabolic Panel: Recent Labs  Lab 04/22/17 1158  NA 141  K 3.7  CL 104  CO2 27  GLUCOSE 112*  BUN 12  CREATININE 0.97  CALCIUM 9.2   Liver Function Tests: No results for input(s): AST, ALT, ALKPHOS, BILITOT, PROT, ALBUMIN in the last 168 hours. No results for input(s): LIPASE, AMYLASE in the last 168 hours. No results for input(s): AMMONIA in the last 168 hours. CBC: Recent Labs  Lab 04/22/17 1158  WBC 5.8  NEUTROABS 3.3  HGB 12.0  HCT 37.6  MCV 94.7  PLT 197   Cardiac Enzymes: No results for input(s): CKTOTAL, CKMB, CKMBINDEX, TROPONINI in the last 168 hours.  BNP (last 3 results) No results for input(s): PROBNP in the last 8760 hours. CBG: Recent Labs  Lab 04/22/17 1800  GLUCAP 170*    Urinalysis    Component Value Date/Time   COLORURINE YELLOW 03/09/2017 2109   APPEARANCEUR CLEAR 03/09/2017 2109   LABSPEC 1.017 03/09/2017 2109   PHURINE 5.0 03/09/2017 2109   GLUCOSEU NEGATIVE 03/09/2017 2109   HGBUR NEGATIVE 03/09/2017 2109   HGBUR negative 01/28/2009 1523   BILIRUBINUR NEGATIVE 03/09/2017 2109   BILIRUBINUR NEG 04/04/2011 0903   KETONESUR NEGATIVE 03/09/2017 2109   PROTEINUR NEGATIVE 03/09/2017 2109   UROBILINOGEN 0.2 04/04/2011 0903   UROBILINOGEN 0.2 01/28/2009 1523   NITRITE NEGATIVE 03/09/2017 2109   LEUKOCYTESUR NEGATIVE 03/09/2017 2109      Radiographic Studies: No results found.  EKG: Independently reviewed. Sinus rhythm at 90. Normal intervals, normal axis, Q V1 through V3. No acute ST-T wave  changes.   Assessment/Plan:   Principal Problem:   Angioedema Active Problems:   Diabetes mellitus out of control (HCC)   Dementia, Alzheimer's, with behavior disturbance   Essential hypertension  ANGIOEDEMA Unclear etiology however patient is on a ARB which can also cause angioedema although at a lower rate than ACE inhibitor. ARB can also unmask deficiencies of C1. However given likely mechanism of mast cell D regulation, will treat with Solu-Medrol 40 every 8 IV, Pepcid 20 bid and Benadryl 25 mg by mouth every 12. I am keeping the Benadryl at a low-dose due to advanced age and dementia.  DEMENTIA Continue Namenda and Haldol.  DIABETES Continue glimepiride, metformin, linagliptin and glargine 15 units Sliding scale coverage as well since Solu-Medrol will increase her blood sugars.  HYPERTENSION I'm continuing patient's Toprol-XLl however I'm holding losartan due to possible association with angioedema. I'm adding low-dose amlodipine 2.5 mg as patient's blood pressure is already high even on the losartan. This may need to be increased.    Other information:   DVT prophylaxis: Lovenox ordered. Code Status: Full code. Family Communication: patient's son was at bedside and he states that his sister who is para return he had recently been in as well Disposition Plan: long-term care facility in which she came Consults called: none Admission status:  observation   The medical decision making on this patient was of high complexity and the patient is at high risk for clinical deterioration, therefore this is a level 3 visit.   Horatio Pel Tublu Chatterjee [facility]  If 7PM-7AM, please contact night-coverage [email or web address] Password [facility] 04/22/2017, 6:51 PM

## 2017-04-22 NOTE — ED Triage Notes (Signed)
[facility] Staff reports that patient ate "unknown substance" and her lips started to swell.  EMS gave 50mg  Bendryl PO. They report that patient's lip swelling was improved

## 2017-04-23 DIAGNOSIS — T783XXD Angioneurotic edema, subsequent encounter: Secondary | ICD-10-CM | POA: Diagnosis not present

## 2017-04-23 LAB — CBC
HCT: 38.3 % (ref 36.0–46.0)
Hemoglobin: 12.7 g/dL (ref 12.0–15.0)
MCH: 31.1 pg (ref 26.0–34.0)
MCHC: 33.2 g/dL (ref 30.0–36.0)
MCV: 93.9 fL (ref 78.0–100.0)
PLATELETS: 218 10*3/uL (ref 150–400)
RBC: 4.08 MIL/uL (ref 3.87–5.11)
RDW: 13.1 % (ref 11.5–15.5)
WBC: 13 10*3/uL — AB (ref 4.0–10.5)

## 2017-04-23 LAB — BASIC METABOLIC PANEL
Anion gap: 11 (ref 5–15)
BUN: 13 mg/dL (ref 6–20)
CHLORIDE: 104 mmol/L (ref 101–111)
CO2: 24 mmol/L (ref 22–32)
Calcium: 9.1 mg/dL (ref 8.9–10.3)
Creatinine, Ser: 0.84 mg/dL (ref 0.44–1.00)
Glucose, Bld: 172 mg/dL — ABNORMAL HIGH (ref 65–99)
Potassium: 3.9 mmol/L (ref 3.5–5.1)
SODIUM: 139 mmol/L (ref 135–145)

## 2017-04-23 LAB — GLUCOSE, CAPILLARY
GLUCOSE-CAPILLARY: 189 mg/dL — AB (ref 65–99)
Glucose-Capillary: 193 mg/dL — ABNORMAL HIGH (ref 65–99)

## 2017-04-23 MED ORDER — AMLODIPINE BESYLATE 2.5 MG PO TABS: 5.0000 mg | ORAL_TABLET | Freq: Every day | ORAL | 0 refills | Status: DC

## 2017-04-23 MED ORDER — FAMOTIDINE 20 MG PO TABS: 20.0000 mg | ORAL_TABLET | Freq: Two times a day (BID) | ORAL | 1 refills | Status: DC

## 2017-04-23 MED ORDER — PREDNISONE 10 MG (21) PO TBPK: ORAL_TABLET | ORAL | 0 refills | Status: DC

## 2017-04-23 NOTE — Discharge Summary (Signed)
[name], is a 77 y.o. female  DOB 04/23/1940  MRN [id number].  Admission date:  04/22/2017  Admitting Physician  Pieter Partridge, MD  Discharge Date:  04/23/2017   Primary MD  Laurann Montana, MD  Recommendations for primary care physician for things to follow:  - please check CBC, BMP during next visit,and no ARB/ACEI given angioedema   Admission Diagnosis  Angioedema, initial encounter [T78.3XXA]   Discharge Diagnosis  Angioedema, initial encounter [T78.3XXA]    Principal Problem:   Angioedema Active Problems:   Diabetes mellitus out of control (HCC)   Dementia, Alzheimer's, with behavior disturbance   Essential hypertension      Past Medical History:  Diagnosis Date  . Anxiety   . Dementia   . Depression   . Diabetes mellitus   . Hepatitis   . Hyperlipidemia   . Hypertension     Past Surgical History:  Procedure Laterality Date  . COLONOSCOPY N/A 02/16/2012   Procedure: COLONOSCOPY;  Surgeon: Vertell Novak., MD;  Location: [facility] ENDOSCOPY;  Service: Endoscopy;  Laterality: N/A;  . ESOPHAGOGASTRODUODENOSCOPY N/A 02/16/2012   Procedure: ESOPHAGOGASTRODUODENOSCOPY (EGD);  Surgeon: Vertell Novak., MD;  [facility] ENDOSCOPY;  Service: Endoscopy;  Laterality: N/A;  . THYROIDECTOMY     in 2000       History of present illness and  Hospital Course:     Kindly see H&P for history of present illness and admission details, please review complete Labs, Consult reports and Test reports for all details in brief  HPI  from the history and physical done on the day of admission  [name] is an 77 y.o. female with past medical history significant for dementia, hypertension and diabetes who was sent from her long-term care facility for lip swelling. Patient herself is unable to provide history. She states she feels fine and does not know why she is here. Patient  denies feeling short of breath, denies chest pain, denies any difficulty swallowing and asks why her keep asking her those questions.  ED Course:  The patient was noted to have angioedema. She was treated with steroids and famotidine.     Hospital Course   Angioedema - Unclear etiology, but most likely related to ARB, so I have stopped her losartan, and added to her drug allergies, so avoid all ARB/ACEI , she was treated with IV Solu-Medrol, Benadryl and famotidine, there is no angioedema, she will be discharged with a prednisone taper and famotidine.  Dementia - Continue withhome meds and supportive care  DIABETES - Continue with home regimen   HYPERTENSION - I have stopped losartan, and started on amlodipine, continue his other home medication.  Patient what blood cell count is 13 at day of discharge, is most likely related to steroid,.   Discharge Condition:  stable   Follow UP  Follow-up Information    Laurann Montana, MD .   Specialty:  Family Medicine Contact information: [address] [place] [address] [phone]  Discharge Instructions  and  Discharge Medications     Discharge Instructions    Discharge instructions   Complete by:  As directed    Follow with Primary MD Laurann Montana, MD in 7 days   Get CBC, CMP checked  by Primary MD next visit.    Activity: As tolerated with Full fall precautions use walker/cane & assistance as needed   Disposition SNF   Diet: Heart Healthy , carb modified,, with feeding assistance and aspiration precautions.  For Heart failure patients - Check your Weight same time everyday, if you gain over 2 pounds, or you develop in leg swelling, experience more shortness of breath or chest pain, call your Primary MD immediately. Follow Cardiac Low Salt Diet and 1.5 lit/day fluid restriction.   On your next visit with your primary care physician please Get Medicines reviewed and  adjusted.   Please request your Prim.MD to go over all Hospital Tests and Procedure/Radiological results at the follow up, please get all Hospital records sent to your Prim MD by signing hospital release before you go home.   If you experience worsening of your admission symptoms, develop shortness of breath, life threatening emergency, suicidal or homicidal thoughts you must seek medical attention immediately by calling 911 or calling your MD immediately  if symptoms less severe.  You Must read complete instructions/literature along with all the possible adverse reactions/side effects for all the Medicines you take and that have been prescribed to you. Take any new Medicines after you have completely understood and accpet all the possible adverse reactions/side effects.   Do not drive, operating heavy machinery, perform activities at heights, swimming or participation in water activities or provide baby sitting services if your were admitted for syncope or siezures until you have seen by Primary MD or a Neurologist and advised to do so again.  Do not drive when taking Pain medications.    Do not take more than prescribed Pain, Sleep and Anxiety Medications  Special Instructions: If you have smoked or chewed Tobacco  in the last 2 yrs please stop smoking, stop any regular Alcohol  and or any Recreational drug use.  Wear Seat belts while driving.   Please note  You were cared for by a hospitalist during your hospital stay. If you have any questions about your discharge medications or the care you received while you were in the hospital after you are discharged, you can call the unit and asked to speak with the hospitalist on call if the hospitalist that took care of you is not available. Once you are discharged, your primary care physician will handle any further medical issues. Please note that NO REFILLS for any discharge medications will be authorized once you are discharged, as it is  imperative that you return to your primary care physician (or establish a relationship with a primary care physician if you do not have one) for your aftercare needs so that they can reassess your need for medications and monitor your lab values.     Allergies as of 04/23/2017      Reactions   Losartan Swelling   angioedema      Medication List    STOP taking these medications   hydrALAZINE 10 MG tablet Commonly known as:  APRESOLINE   losartan 100 MG tablet Commonly known as:  COZAAR     TAKE these medications   acetaminophen 500 MG tablet Commonly known as:  TYLENOL Take 500 mg by mouth every 6 (six) hours  as needed for headache (pain).   alendronate 70 MG tablet Commonly known as:  FOSAMAX Take 70 mg by mouth every Tuesday. Take with a full glass of water on an empty stomach.   AMARYL 4 MG tablet Generic drug:  glimepiride Take 4 mg by mouth daily.   amLODipine 2.5 MG tablet Commonly known as:  NORVASC Take 2 tablets (5 mg total) by mouth daily. Start taking on:  04/24/2017   calcium-vitamin D 500-200 MG-UNIT tablet Commonly known as:  OSCAL WITH D Take 1 tablet by mouth daily.   cetirizine 10 MG tablet Commonly known as:  ZYRTEC Take 10 mg by mouth daily.   donepezil 5 MG tablet Commonly known as:  ARICEPT Take 1 tablet (5 mg total) by mouth at bedtime.   famotidine 20 MG tablet Commonly known as:  PEPCID Take 1 tablet (20 mg total) by mouth 2 (two) times daily for 15 days.   haloperidol 2 MG/ML solution Commonly known as:  HALDOL Take 0.5 mg by mouth at bedtime.   insulin glargine 100 UNIT/ML injection Commonly known as:  LANTUS Inject 10 Units into the skin at bedtime.   linagliptin 5 MG Tabs tablet Commonly known as:  TRADJENTA Take 5 mg by mouth daily.   metFORMIN 500 MG 24 hr tablet Commonly known as:  GLUCOPHAGE-XR Take 500 mg by mouth every evening.   metoprolol succinate 25 MG 24 hr tablet Commonly known as:  TOPROL-XL Take 25 mg by  mouth daily.   mirtazapine 15 MG tablet Commonly known as:  REMERON Take 15 mg by mouth at bedtime.   NAMENDA XR 28 MG Cp24 24 hr capsule Generic drug:  memantine Take 28 mg by mouth daily.   omeprazole 40 MG capsule Commonly known as:  PRILOSEC Take 1 capsule (40 mg total) by mouth daily.   predniSONE 10 MG (21) Tbpk tablet Commonly known as:  STERAPRED UNI-PAK 21 TAB Use per package instruction         Diet and Activity recommendation: See Discharge Instructions above   Consults obtained - None   Major procedures and Radiology Reports - PLEASE review detailed and final reports for all details, in brief -      No results found.  Micro Results   No results found for this or any previous visit (from the past 240 hour(s)).     Today   Subjective:   [name] today is pleasantly confused, she denies any complaints.  Objective:   Blood pressure (!) 141/67, pulse 61, temperature 98 F (36.7 C), temperature source Oral, resp. rate 16, SpO2 100 %.   Intake/Output Summary (Last 24 hours) at 04/23/2017 1245 Last data filed at 04/22/2017 1352 Gross per 24 hour  Intake 50 ml  Output -  Net 50 ml    Exam Awake , pleasantly confused, demented no apparent distress Supple Neck,No JVD, No cervical lymphadenopathy appriciated. No tongue swelling, no lip swelling, no increased oral secretions Symmetrical Chest wall movement, Good air movement bilaterally, CTAB RRR,No Gallops,Rubs or new Murmurs, No Parasternal Heave +ve B.Sounds, Abd Soft, Non tender, No organomegaly appriciated, No rebound -guarding or rigidity. No Cyanosis, Clubbing or edema, No new Rash or bruise  Data Review   CBC w Diff:  Lab Results  Component Value Date   WBC 13.0 (H) 04/23/2017   HGB 12.7 04/23/2017   HCT 38.3 04/23/2017   PLT 218 04/23/2017   LYMPHOPCT 34 04/22/2017   MONOPCT 6 04/22/2017   EOSPCT 3 04/22/2017  BASOPCT 0 04/22/2017    CMP:  Lab Results  Component Value  Date   NA 139 04/23/2017   K 3.9 04/23/2017   CL 104 04/23/2017   CO2 24 04/23/2017   BUN 13 04/23/2017   CREATININE 0.84 04/23/2017   CREATININE 1.03 04/25/2011   PROT 7.4 03/09/2017   ALBUMIN 3.9 03/09/2017   BILITOT 0.5 03/09/2017   ALKPHOS 73 03/09/2017   AST 42 (H) 03/09/2017   ALT 33 03/09/2017  .   Total Time in preparing paper work, data evaluation and todays exam - 35 minutes  Huey Bienenstock M.D on 04/23/2017 at 12:45 PM  [facility]   Office  [phone]

## 2017-04-23 NOTE — Progress Notes (Signed)
[facility] is being [facility] this pm. [facility] called and notified. Report called and given to receiving facility nurse. Attempted to notify daughter but she did not pick up the phone. Will call her again later

## 2017-04-23 NOTE — Progress Notes (Signed)
Clinical Social Worker was consulted to assist patient with her return back to [facility]. CSW contacted patients daughter Angus Palms([name] Athanas) via phone. Cassandra stated she agrees with patient going back to facility and would like [facility] to be called for transport. Facility is aware that patient will be returning back today.  Clinical Social Worker facilitated patient discharge including contacting patient family and facility to confirm patient discharge plans.  Clinical information faxed to facility and family agreeable with plan.  CSW arranged ambulance transport via [facility] ([facility]) .  RN to call [phone] (ask to speak to Clydie BraunKaren)  for report prior to discharge.  Clinical Social Worker will sign off for now as social work intervention is no longer needed. Please consult us again if new need arises.  Marrianne MoodAshley Naoma Boxell, MSW, [employer]WA [phone]

## 2017-04-23 NOTE — Discharge Instructions (Signed)
Follow with Primary MD Laurann MontanaWhite, Cynthia, MD in 7 days   Get CBC, CMP checked  by Primary MD next visit.    Activity: As tolerated with Full fall precautions use walker/cane & assistance as needed   Disposition SNF   Diet: Heart Healthy , carb modified,, with feeding assistance and aspiration precautions.  For Heart failure patients - Check your Weight same time everyday, if you gain over 2 pounds, or you develop in leg swelling, experience more shortness of breath or chest pain, call your Primary MD immediately. Follow Cardiac Low Salt Diet and 1.5 lit/day fluid restriction.   On your next visit with your primary care physician please Get Medicines reviewed and adjusted.   Please request your Prim.MD to go over all Hospital Tests and Procedure/Radiological results at the follow up, please get all Hospital records sent to your Prim MD by signing hospital release before you go home.   If you experience worsening of your admission symptoms, develop shortness of breath, life threatening emergency, suicidal or homicidal thoughts you must seek medical attention immediately by calling 911 or calling your MD immediately  if symptoms less severe.  You Must read complete instructions/literature along with all the possible adverse reactions/side effects for all the Medicines you take and that have been prescribed to you. Take any new Medicines after you have completely understood and accpet all the possible adverse reactions/side effects.   Do not drive, operating heavy machinery, perform activities at heights, swimming or participation in water activities or provide baby sitting services if your were admitted for syncope or siezures until you have seen by Primary MD or a Neurologist and advised to do so again.  Do not drive when taking Pain medications.    Do not take more than prescribed Pain, Sleep and Anxiety Medications  Special Instructions: If you have smoked or chewed Tobacco  in the last  2 yrs please stop smoking, stop any regular Alcohol  and or any Recreational drug use.  Wear Seat belts while driving.   Please note  You were cared for by a hospitalist during your hospital stay. If you have any questions about your discharge medications or the care you received while you were in the hospital after you are discharged, you can call the unit and asked to speak with the hospitalist on call if the hospitalist that took care of you is not available. Once you are discharged, your primary care physician will handle any further medical issues. Please note that NO REFILLS for any discharge medications will be authorized once you are discharged, as it is imperative that you return to your primary care physician (or establish a relationship with a primary care physician if you do not have one) for your aftercare needs so that they can reassess your need for medications and monitor your lab values.

## 2017-04-24 ENCOUNTER — Emergency Department (HOSPITAL_COMMUNITY): Payer: Medicare Other

## 2017-04-24 ENCOUNTER — Observation Stay (HOSPITAL_COMMUNITY)
Admission: EM | Admit: 2017-04-24 | Discharge: 2017-04-27 | Disposition: A | Discharge status: Skilled Nursing Facility | Payer: Medicare Other | Attending: Internal Medicine | Admitting: Internal Medicine

## 2017-04-24 ENCOUNTER — Encounter (HOSPITAL_COMMUNITY): Payer: Self-pay | Admitting: Family Medicine

## 2017-04-24 DIAGNOSIS — R5381 Other malaise: Secondary | ICD-10-CM

## 2017-04-24 DIAGNOSIS — R5383 Other fatigue: Secondary | ICD-10-CM

## 2017-04-24 DIAGNOSIS — F0281 Dementia in other diseases classified elsewhere with behavioral disturbance: Secondary | ICD-10-CM | POA: Diagnosis not present

## 2017-04-24 DIAGNOSIS — F329 Major depressive disorder, single episode, unspecified: Secondary | ICD-10-CM | POA: Insufficient documentation

## 2017-04-24 DIAGNOSIS — E860 Dehydration: Principal | ICD-10-CM | POA: Insufficient documentation

## 2017-04-24 DIAGNOSIS — G9340 Encephalopathy, unspecified: Secondary | ICD-10-CM | POA: Diagnosis present

## 2017-04-24 DIAGNOSIS — K219 Gastro-esophageal reflux disease without esophagitis: Secondary | ICD-10-CM | POA: Diagnosis not present

## 2017-04-24 DIAGNOSIS — K529 Noninfective gastroenteritis and colitis, unspecified: Secondary | ICD-10-CM | POA: Diagnosis not present

## 2017-04-24 DIAGNOSIS — T783XXA Angioneurotic edema, initial encounter: Secondary | ICD-10-CM | POA: Insufficient documentation

## 2017-04-24 DIAGNOSIS — N179 Acute kidney failure, unspecified: Secondary | ICD-10-CM | POA: Insufficient documentation

## 2017-04-24 DIAGNOSIS — F02818 Dementia in other diseases classified elsewhere, unspecified severity, with other behavioral disturbance: Secondary | ICD-10-CM | POA: Diagnosis present

## 2017-04-24 DIAGNOSIS — G9341 Metabolic encephalopathy: Secondary | ICD-10-CM | POA: Diagnosis not present

## 2017-04-24 DIAGNOSIS — R011 Cardiac murmur, unspecified: Secondary | ICD-10-CM | POA: Insufficient documentation

## 2017-04-24 DIAGNOSIS — R2681 Unsteadiness on feet: Secondary | ICD-10-CM | POA: Insufficient documentation

## 2017-04-24 DIAGNOSIS — Z79899 Other long term (current) drug therapy: Secondary | ICD-10-CM | POA: Diagnosis not present

## 2017-04-24 DIAGNOSIS — M810 Age-related osteoporosis without current pathological fracture: Secondary | ICD-10-CM | POA: Insufficient documentation

## 2017-04-24 DIAGNOSIS — E785 Hyperlipidemia, unspecified: Secondary | ICD-10-CM | POA: Diagnosis not present

## 2017-04-24 DIAGNOSIS — E1165 Type 2 diabetes mellitus with hyperglycemia: Secondary | ICD-10-CM | POA: Diagnosis not present

## 2017-04-24 DIAGNOSIS — G309 Alzheimer's disease, unspecified: Secondary | ICD-10-CM

## 2017-04-24 DIAGNOSIS — R112 Nausea with vomiting, unspecified: Secondary | ICD-10-CM

## 2017-04-24 DIAGNOSIS — F419 Anxiety disorder, unspecified: Secondary | ICD-10-CM | POA: Diagnosis not present

## 2017-04-24 DIAGNOSIS — Z888 Allergy status to other drugs, medicaments and biological substances status: Secondary | ICD-10-CM | POA: Diagnosis not present

## 2017-04-24 DIAGNOSIS — Z794 Long term (current) use of insulin: Secondary | ICD-10-CM | POA: Insufficient documentation

## 2017-04-24 DIAGNOSIS — T783XXD Angioneurotic edema, subsequent encounter: Secondary | ICD-10-CM | POA: Diagnosis not present

## 2017-04-24 DIAGNOSIS — R197 Diarrhea, unspecified: Secondary | ICD-10-CM

## 2017-04-24 DIAGNOSIS — I10 Essential (primary) hypertension: Secondary | ICD-10-CM | POA: Insufficient documentation

## 2017-04-24 DIAGNOSIS — IMO0002 Reserved for concepts with insufficient information to code with codable children: Secondary | ICD-10-CM | POA: Diagnosis present

## 2017-04-24 HISTORY — DX: Age-related osteoporosis without current pathological fracture: M81.0

## 2017-04-24 HISTORY — DX: Gastro-esophageal reflux disease without esophagitis: K21.9

## 2017-04-24 HISTORY — DX: Cardiac murmur, unspecified: R01.1

## 2017-04-24 HISTORY — DX: Unspecified blepharitis unspecified eye, unspecified eyelid: H01.009

## 2017-04-24 HISTORY — DX: Frequency of micturition: R35.0

## 2017-04-24 LAB — URINALYSIS, ROUTINE W REFLEX MICROSCOPIC
Bilirubin Urine: NEGATIVE
Glucose, UA: NEGATIVE mg/dL
Hgb urine dipstick: NEGATIVE
Ketones, ur: NEGATIVE mg/dL
LEUKOCYTES UA: NEGATIVE
Nitrite: NEGATIVE
PROTEIN: NEGATIVE mg/dL
Specific Gravity, Urine: 1.02 (ref 1.005–1.030)
pH: 5 (ref 5.0–8.0)

## 2017-04-24 LAB — CBC WITH DIFFERENTIAL/PLATELET
BASOS ABS: 0 10*3/uL (ref 0.0–0.1)
Basophils Relative: 0 %
EOS ABS: 0 10*3/uL (ref 0.0–0.7)
EOS PCT: 0 %
HCT: 45 % (ref 36.0–46.0)
HEMOGLOBIN: 15.1 g/dL — AB (ref 12.0–15.0)
LYMPHS PCT: 15 %
Lymphs Abs: 1.5 10*3/uL (ref 0.7–4.0)
MCH: 32.3 pg (ref 26.0–34.0)
MCHC: 33.6 g/dL (ref 30.0–36.0)
MCV: 96.4 fL (ref 78.0–100.0)
Monocytes Absolute: 0.5 10*3/uL (ref 0.1–1.0)
Monocytes Relative: 5 %
NEUTROS PCT: 80 %
Neutro Abs: 7.6 10*3/uL (ref 1.7–7.7)
PLATELETS: 247 10*3/uL (ref 150–400)
RBC: 4.67 MIL/uL (ref 3.87–5.11)
RDW: 13.7 % (ref 11.5–15.5)
WBC: 9.5 10*3/uL (ref 4.0–10.5)

## 2017-04-24 LAB — COMPREHENSIVE METABOLIC PANEL
ALBUMIN: 4 g/dL (ref 3.5–5.0)
ALK PHOS: 86 U/L (ref 38–126)
ALT: 31 U/L (ref 14–54)
ANION GAP: 13 (ref 5–15)
AST: 31 U/L (ref 15–41)
BUN: 30 mg/dL — ABNORMAL HIGH (ref 6–20)
CALCIUM: 9.7 mg/dL (ref 8.9–10.3)
CO2: 25 mmol/L (ref 22–32)
Chloride: 107 mmol/L (ref 101–111)
Creatinine, Ser: 1.04 mg/dL — ABNORMAL HIGH (ref 0.44–1.00)
GFR calc non Af Amer: 50 mL/min — ABNORMAL LOW (ref >60)
GFR, EST AFRICAN AMERICAN: 58 mL/min — AB (ref >60)
Glucose, Bld: 177 mg/dL — ABNORMAL HIGH (ref 65–99)
POTASSIUM: 4.7 mmol/L (ref 3.5–5.1)
SODIUM: 145 mmol/L (ref 135–145)
Total Bilirubin: 0.8 mg/dL (ref 0.3–1.2)
Total Protein: 8.4 g/dL — ABNORMAL HIGH (ref 6.5–8.1)

## 2017-04-24 LAB — LIPASE, BLOOD: Lipase: 40 U/L (ref 11–51)

## 2017-04-24 LAB — TROPONIN I

## 2017-04-24 MED ORDER — SODIUM CHLORIDE 0.9 % IV SOLN
INTRAVENOUS | Status: DC
  Administered 2017-04-24 – 2017-04-26 (×3): via INTRAVENOUS

## 2017-04-24 NOTE — Progress Notes (Signed)
Report attempted to receiving nurse.

## 2017-04-24 NOTE — Progress Notes (Signed)
ED TO INPATIENT HANDOFF REPORT  Name/Age/Gender [name] 77 y.o. female  Code Status Code Status History    Date Active Date Inactive Code Status Order ID Comments User Context   04/22/2017 1557 04/23/2017 1919 Full Code [id number]  Vashti Hey, MD ED   03/10/2017 0254 03/10/2017 2147 Full Code [id number]  Bethena Roys, MD ED   02/14/2012 2119 02/17/2012 1613 Full Code [id number]  Janell Quiet, MD ED      Home/SNF/Other Home  Chief Complaint Dehydration  Level of Care/Admitting Diagnosis ED Disposition    ED Disposition Condition Comment   [facility]: [facility] [100102]  Level of Care: Telemetry [5]  Admit to tele based on following criteria: Monitor for Ischemic changes  Diagnosis: Dehydration [276.51.ICD-9-CM]  Admitting Physician: Toy Baker [[id number]]  Attending Physician: Toy Baker [[id number]]  PT Class (Do Not Modify): Observation [104]  PT Acc Code (Do Not Modify): Observation [10022]       Medical History Past Medical History:  Diagnosis Date  . Anxiety   . Blepharitis   . Dementia   . Depression   . Diabetes mellitus   . GERD (gastroesophageal reflux disease)   . Heart murmur, systolic   . Hepatitis   . Hyperlipidemia   . Hypertension   . Osteoporosis   . Urinary frequency     Allergies Allergies  Allergen Reactions  . Losartan Swelling    angioedema    IV Location/Drains/Wounds Patient Lines/Drains/Airways Status   Active Line/Drains/Airways    Name:   Placement date:   Placement time:   Site:   Days:   Peripheral IV 04/24/17 Left Antecubital   04/24/17    1602    Antecubital   less than 1          Labs/Imaging Results for orders placed or performed during the hospital encounter of 04/24/17 (from the past 48 hour(s))  Comprehensive metabolic panel     Status: Abnormal   Collection Time: 04/24/17  3:45 PM  Result Value Ref Range   Sodium 145 135 - 145 mmol/L   Potassium 4.7  3.5 - 5.1 mmol/L   Chloride 107 101 - 111 mmol/L   CO2 25 22 - 32 mmol/L   Glucose, Bld 177 (H) 65 - 99 mg/dL   BUN 30 (H) 6 - 20 mg/dL   Creatinine, Ser 1.04 (H) 0.44 - 1.00 mg/dL   Calcium 9.7 8.9 - 10.3 mg/dL   Total Protein 8.4 (H) 6.5 - 8.1 g/dL   Albumin 4.0 3.5 - 5.0 g/dL   AST 31 15 - 41 U/L   ALT 31 14 - 54 U/L   Alkaline Phosphatase 86 38 - 126 U/L   Total Bilirubin 0.8 0.3 - 1.2 mg/dL   GFR calc non Af Amer 50 (L) >60 mL/min   GFR calc Af Amer 58 (L) >60 mL/min    Comment: (NOTE) The eGFR has been calculated using the CKD EPI equation. This calculation has not been validated in all clinical situations. eGFR's persistently <60 mL/min signify possible Chronic Kidney Disease.    Anion gap 13 5 - 15    Comment: Performed at [facility], [place] [address], [place], [place] [address]  Lipase, blood     Status: None   Collection Time: 04/24/17  3:45 PM  Result Value Ref Range   Lipase 40 11 - 51 U/L    Comment: Performed at [facility], [place] [address] [place],  [place] [address]  Troponin I     Status: None   Collection Time: 04/24/17  3:45 PM  Result Value Ref Range   Troponin I <0.03 <0.03 ng/mL    Comment: Performed at [facility], [place] [address], [place], [place] [address]  CBC with Differential     Status: Abnormal   Collection Time: 04/24/17  3:45 PM  Result Value Ref Range   WBC 9.5 4.0 - 10.5 K/uL   RBC 4.67 3.87 - 5.11 MIL/uL   Hemoglobin 15.1 (H) 12.0 - 15.0 g/dL   HCT 45.0 36.0 - 46.0 %   MCV 96.4 78.0 - 100.0 fL   MCH 32.3 26.0 - 34.0 pg   MCHC 33.6 30.0 - 36.0 g/dL   RDW 13.7 11.5 - 15.5 %   Platelets 247 150 - 400 K/uL   Neutrophils Relative % 80 %   Neutro Abs 7.6 1.7 - 7.7 K/uL   Lymphocytes Relative 15 %   Lymphs Abs 1.5 0.7 - 4.0 K/uL   Monocytes Relative 5 %   Monocytes Absolute 0.5 0.1 - 1.0 K/uL   Eosinophils Relative 0 %   Eosinophils Absolute 0.0 0.0 - 0.7 K/uL   Basophils Relative  0 %   Basophils Absolute 0.0 0.0 - 0.1 K/uL    Comment: Performed at [facility], [place] [address] [place], [place] [address]  Urinalysis, Routine w reflex microscopic     Status: None   Collection Time: 04/24/17  3:45 PM  Result Value Ref Range   Color, Urine YELLOW YELLOW   APPearance CLEAR CLEAR   Specific Gravity, Urine 1.020 1.005 - 1.030   pH 5.0 5.0 - 8.0   Glucose, UA NEGATIVE NEGATIVE mg/dL   Hgb urine dipstick NEGATIVE NEGATIVE   Bilirubin Urine NEGATIVE NEGATIVE   Ketones, ur NEGATIVE NEGATIVE mg/dL   Protein, ur NEGATIVE NEGATIVE mg/dL   Nitrite NEGATIVE NEGATIVE   Leukocytes, UA NEGATIVE NEGATIVE    Comment: Performed at [facility], [place] [address], [place], [place] [address]   Ct Abdomen Pelvis Wo Contrast  Result Date: 04/24/2017 CLINICAL DATA:  Vomiting and diarrhea x3 episodes this morning. Patient was seen for allergic reaction yesterday. Concern for dehydration. EXAM: CT ABDOMEN AND PELVIS WITHOUT CONTRAST TECHNIQUE: Multidetector CT imaging of the abdomen and pelvis was performed following the standard protocol without IV contrast. COMPARISON:  02/16/2012 pelvic ultrasound FINDINGS: Lower chest: Top normal heart size without pericardial effusion. The visualized thoracic aorta demonstrates minimal atherosclerosis. There is coronary arteriosclerosis along the left circumflex and LAD. Hepatobiliary: Biliary sludge and calculi are identified within the gallbladder. No pericholecystic fluid or wall thickening. The unenhanced liver is unremarkable. Pancreas: No ductal dilatation, mass or inflammation given limitations of a noncontrast study. Spleen: Normal size spleen without focal mass. Adrenals/Urinary Tract: Normal bilateral adrenal glands. Punctate nonobstructing calculus in the lower pole of the right kidney. No hydroureteronephrosis. Bladder unremarkable for degree of distention. Stomach/Bowel: The stomach is distended with ingested  food. There is normal small rotation. Mild fluid-filled distention of small bowel without mechanical source of bowel obstruction. Findings may reflect a mild small bowel enteritis. Normal appendix visualized. Colonic diverticulosis without acute diverticulitis. Vascular/Lymphatic: Aortoiliac atherosclerosis. No aneurysm. No lymphadenopathy. Reproductive: Calcified uterine fibroids are noted. No adnexal mass. Other: Tiny umbilical fat containing hernia. No free air nor free fluid. Musculoskeletal: Degenerative disc disease L4-5 and L5-S1 with left central to foraminal small disc herniation at L4-5. IMPRESSION: 1. Mild diffuse small bowel distention  without mechanical bowel obstruction compatible with a mild small bowel enteritis. 2. Coronary arteriosclerosis. 3. Punctate nonobstructing right lower pole renal calculus. No hydroureteronephrosis. 4. Fibroid uterus. Electronically Signed   By: Ashley Royalty M.D.   On: 04/24/2017 17:58   Dg Chest 2 View  Result Date: 04/24/2017 CLINICAL DATA:  Nausea and vomiting. EXAM: CHEST - 2 VIEW COMPARISON:  Chest x-ray dated March 10, 2017. FINDINGS: The heart size and mediastinal contours are within normal limits. Normal pulmonary vascularity. No focal consolidation, pleural effusion, or pneumothorax. No acute osseous abnormality. IMPRESSION: No active cardiopulmonary disease. Electronically Signed   By: Titus Dubin M.D.   On: 04/24/2017 16:51   Ct Head Wo Contrast  Result Date: 04/24/2017 CLINICAL DATA:  Altered mental status, dehydration, vomiting. EXAM: CT HEAD WITHOUT CONTRAST TECHNIQUE: Contiguous axial images were obtained from the base of the skull through the vertex without intravenous contrast. COMPARISON:  CT head 04/26/2005. FINDINGS: Brain: No evidence for acute infarction, hemorrhage, mass lesion, hydrocephalus, or extra-axial fluid. Advanced atrophy. Hypoattenuation of white matter, likely small vessel disease. Vascular: Calcification of the cavernous  internal carotid arteries consistent with cerebrovascular atherosclerotic disease. No signs of intracranial large vessel occlusion. Skull: Normal. Negative for fracture or focal lesion. Sinuses/Orbits: No acute finding. Other: None. IMPRESSION: Atrophy and small vessel disease, progressive since 2007. No acute intracranial findings. Electronically Signed   By: Staci Righter M.D.   On: 04/24/2017 20:46    Pending Labs Unresulted Labs (From admission, onward)   Start     Ordered   04/24/17 2126  C difficile quick scan w PCR reflex  (C Difficile quick screen w PCR reflex panel)  Once, for 24 hours,   R     04/24/17 2125   04/24/17 1530  Urine culture  STAT,   STAT     04/24/17 1529   Signed and Held  Gastrointestinal Panel by PCR , Stool  (Gastrointestinal Panel by PCR, Stool)  Once,   R     Signed and Held   Signed and Held  Hemoglobin A1c  Tomorrow morning,   R    Comments:  To assess prior glycemic control    Signed and Held   Signed and Held  Magnesium  Tomorrow morning,   R    Comments:  Call MD if <1.5    Signed and Held   Signed and Held  Phosphorus  Tomorrow morning,   R     Signed and Held   Signed and Held  TSH  Once,   R    Comments:  Cancel if already done within 1 month and notify MD    Signed and Held   Signed and Held  Comprehensive metabolic panel  Once,   R    Comments:  Cal MD for K<3.5 or >5.0    Signed and Held   Signed and Held  CBC  Once,   R    Comments:  Call for hg <8.0    Signed and Held   Signed and Held  Troponin I  Now then every 6 hours,   R     Signed and Held      Vitals/Pain Today's Vitals   04/24/17 2215 04/24/17 2230 04/24/17 2245 04/24/17 2300  BP: (!) 156/92 (!) 157/89 (!) 160/90 (!) 158/90  Pulse: 87 87 84 97  Resp: _0 Temp:      TempSrc:      SpO2: 97% 98% 97% 98%  Isolation Precautions Enteric precautions (UV disinfection)  Medications Medications  0.9 %  sodium chloride infusion ( Intravenous New Bag/Given  04/24/17 1602)    Mobility walks with device

## 2017-04-24 NOTE — ED Provider Notes (Signed)
[facility]-EMERGENCY DEPT Provider Note   CSN: [id number] Arrival date & time: 04/24/17  1504     History   Chief Complaint Chief Complaint  Patient presents with  . Dehydration    HPI [name] is a 77 y.o. female.  The history is provided by the EMS personnel, the nursing home and the patient. The history is limited by the condition of the patient (Hx dementia).  Pt was seen at 1515. Per EMS and NH report: Pt with several episodes of N/V/D since 8am today. NH concerned pt "is getting dehydrated." Pt denied complaints to EMS staff, had negative orthostatic VS. Pt told ED staff her abd "hurt." Denies CP/SOB, no reported fevers, no reported black or blood in stools or emesis. [facility].   Past Medical History:  Diagnosis Date  . Anxiety   . Blepharitis   . Dementia   . Depression   . Diabetes mellitus   . GERD (gastroesophageal reflux disease)   . Heart murmur, systolic   . Hepatitis   . Hyperlipidemia   . Hypertension   . Osteoporosis   . Urinary frequency     Patient Active Problem List   Diagnosis Date Noted  . Angioedema 04/22/2017  . HCAP (healthcare-associated pneumonia) 03/10/2017  . Anemia 02/14/2012  . GI Bleed 02/14/2012  . Blepharitis of left upper eyelid 04/25/2011  . Cough 12/30/2010  . Right ear pain 11/20/2010  . Urinary frequency 11/20/2010  . Pain in gums 06/07/2010  . Systolic murmur 03/18/2010  . Right thigh pain 03/18/2010  . Urge incontinence 03/18/2010  . Dementia, Alzheimer's, with behavior disturbance 09/30/2009  . OSTEOPOROSIS 09/21/2009  . CONSTIPATION 05/15/2009  . Diabetes mellitus out of control (HCC) 01/12/2009  . HYPERLIPIDEMIA 01/12/2009  . ANXIETY 01/12/2009  . DEPRESSION 01/12/2009  . Essential hypertension 01/12/2009  . THYROIDECTOMY, HX OF 01/12/2009    Past Surgical History:  Procedure Laterality Date  . COLONOSCOPY N/A 02/16/2012   Procedure: COLONOSCOPY;   Surgeon: Vertell NovakJames L Edwards Jr., MD;  [facility];  Service: Endoscopy;  Laterality: N/A;  . ESOPHAGOGASTRODUODENOSCOPY N/A 02/16/2012   Procedure: ESOPHAGOGASTRODUODENOSCOPY (EGD);  Surgeon: Vertell NovakJames L Edwards Jr., MD;  [facility];  Service: Endoscopy;  Laterality: N/A;  . THYROIDECTOMY     in 2000     OB History   None      Home Medications    Prior to Admission medications   Medication Sig Start Date End Date Taking? Authorizing Provider  amLODipine (NORVASC) 5 MG tablet Take 5 mg by mouth daily.   Yes [provider]  calcium-vitamin D (OSCAL WITH D) 500-200 MG-UNIT per tablet Take 1 tablet by mouth daily. 02/17/12  Yes Rai, Ripudeep K, MD  cetirizine (ZYRTEC) 10 MG tablet Take 10 mg by mouth daily.   Yes [provider]  donepezil (ARICEPT) 5 MG tablet Take 1 tablet (5 mg total) by mouth at bedtime. 06/06/11  Yes Floydene FlockNewton, Steven J, MD  glimepiride (AMARYL) 4 MG tablet Take 4 mg by mouth daily.    Yes [provider]  haloperidol (HALDOL) 2 MG/ML solution Take 0.5 mg by mouth at bedtime.   Yes [provider]  hydrALAZINE (APRESOLINE) 10 MG tablet Take 10 mg by mouth 3 (three) times daily.   Yes [provider]  insulin glargine (LANTUS) 100 UNIT/ML injection Inject 15 Units into the skin at bedtime.    Yes [provider]  linagliptin (TRADJENTA)  5 MG TABS tablet Take 5 mg by mouth daily.   Yes [provider]  memantine (NAMENDA XR) 28 MG CP24 24 hr capsule Take 28 mg by mouth daily.   Yes [provider]  metoprolol succinate (TOPROL-XL) 25 MG 24 hr tablet Take 25 mg by mouth daily.   Yes [provider]  mirtazapine (REMERON) 15 MG tablet Take 15 mg by mouth at bedtime.   Yes [provider]  omeprazole (PRILOSEC) 40 MG capsule Take 1 capsule (40 mg total) by mouth daily. 06/06/11  Yes Floydene Flock, MD  alendronate (FOSAMAX) 70 MG tablet Take 70 mg by mouth every Tuesday. Take  with a full glass of water on an empty stomach.     [provider]  amLODipine (NORVASC) 2.5 MG tablet Take 2 tablets (5 mg total) by mouth daily. Patient not taking: Reported on 04/24/2017 04/24/17   Elgergawy, Leana Roe, MD  famotidine (PEPCID) 20 MG tablet Take 1 tablet (20 mg total) by mouth 2 (two) times daily for 15 days. 04/23/17 05/08/17  Elgergawy, Leana Roe, MD  metFORMIN (GLUCOPHAGE-XR) 500 MG 24 hr tablet Take 500 mg by mouth every evening.    [provider]  predniSONE (STERAPRED UNI-PAK 21 TAB) 10 MG (21) TBPK tablet Use per package instruction 04/23/17   Elgergawy, Leana Roe, MD    Family History Family History  Problem Relation Age of Onset  . Cancer Sister        brain  . Diabetes Son   . Hypertension Son   . Diabetes Daughter     Social History Social History   Tobacco Use  . Smoking status: Never Smoker  . Smokeless tobacco: Never Used  Substance Use Topics  . Alcohol use: No    Alcohol/week: 0.0 oz  . Drug use: No     Allergies   Losartan   Review of Systems Review of Systems  Unable to perform ROS: Dementia     Physical Exam Updated Vital Signs BP (!) 159/99 (BP Location: Left Arm)   Pulse 100   Temp 97.9 F (36.6 C) (Oral)   Resp 20   SpO2 99%   Physical Exam 1520: Physical examination:  Nursing notes reviewed; Vital signs and O2 SAT reviewed;  Constitutional: Well developed, Well nourished, In no acute distress; Head:  Normocephalic, atraumatic; Eyes: EOMI, PERRL, No scleral icterus; ENMT: Mouth and pharynx normal, Mucous membranes dry. No intra-oral edema, no lips edema.; Neck: Supple, Full range of motion, No lymphadenopathy; Cardiovascular: Regular rate and rhythm, No gallop; Respiratory: Breath sounds clear & equal bilaterally, No wheezes.  Speaking full sentences with ease, Normal respiratory effort/excursion; Chest: Nontender, Movement normal; Abdomen: Soft, +mild diffuse tenderness to palp.  Nondistended, Normal bowel  sounds; Genitourinary: No CVA tenderness; Extremities: Peripheral pulses normal, No tenderness, No edema, No calf edema or asymmetry.; Neuro: Laying eyes closed, confused per hx dementia. No facial droop. Will talk with ED staff when spoken too, speech is clear. Moves all extremities on stretcher spontaneously..; Skin: Color normal, Warm, Dry.   ED Treatments / Results  Labs (all labs ordered are listed, but only abnormal results are displayed)   EKG None  Radiology   Procedures Procedures (including critical care time)  Medications Ordered in ED Medications  0.9 %  sodium chloride infusion (has no administration in time range)     Initial Impression / Assessment and Plan / ED Course  I have reviewed the triage vital signs and the nursing notes.  Pertinent labs & imaging results that were available during my care of the patient were reviewed by me and considered in my medical decision making (see chart for details).  MDM Reviewed: previous chart, nursing note and vitals Reviewed previous: labs and ECG Interpretation: labs, x-ray, ECG and CT scan   ED ECG REPORT   Date: 04/24/2017  Rate: 97  Rhythm: normal sinus rhythm  QRS Axis: normal  Intervals: normal  ST/T Wave abnormalities: normal  Conduction Disutrbances:none  Narrative Interpretation:   Old EKG Reviewed: unchanged; no significant changes from previous EKG dated 04/22/2017.   Results for orders placed or performed during the hospital encounter of 04/24/17  Comprehensive metabolic panel  Result Value Ref Range   Sodium 145 135 - 145 mmol/L   Potassium 4.7 3.5 - 5.1 mmol/L   Chloride 107 101 - 111 mmol/L   CO2 25 22 - 32 mmol/L   Glucose, Bld 177 (H) 65 - 99 mg/dL   BUN 30 (H) 6 - 20 mg/dL   Creatinine, Ser 1.61 (H) 0.44 - 1.00 mg/dL   Calcium 9.7 8.9 - 09.6 mg/dL   Total Protein 8.4 (H) 6.5 - 8.1 g/dL   Albumin 4.0 3.5 - 5.0 g/dL   AST 31 15 - 41 U/L   ALT 31 14 - 54 U/L   Alkaline Phosphatase 86 38 -  126 U/L   Total Bilirubin 0.8 0.3 - 1.2 mg/dL   GFR calc non Af Amer 50 (L) >60 mL/min   GFR calc Af Amer 58 (L) >60 mL/min   Anion gap 13 5 - 15  Lipase, blood  Result Value Ref Range   Lipase 40 11 - 51 U/L  Troponin I  Result Value Ref Range   Troponin I <0.03 <0.03 ng/mL  CBC with Differential  Result Value Ref Range   WBC 9.5 4.0 - 10.5 K/uL   RBC 4.67 3.87 - 5.11 MIL/uL   Hemoglobin 15.1 (H) 12.0 - 15.0 g/dL   HCT 04.5 40.9 - 81.1 %   MCV 96.4 78.0 - 100.0 fL   MCH 32.3 26.0 - 34.0 pg   MCHC 33.6 30.0 - 36.0 g/dL   RDW 91.4 78.2 - 95.6 %   Platelets 247 150 - 400 K/uL   Neutrophils Relative % 80 %   Neutro Abs 7.6 1.7 - 7.7 K/uL   Lymphocytes Relative 15 %   Lymphs Abs 1.5 0.7 - 4.0 K/uL   Monocytes Relative 5 %   Monocytes Absolute 0.5 0.1 - 1.0 K/uL   Eosinophils Relative 0 %   Eosinophils Absolute 0.0 0.0 - 0.7 K/uL   Basophils Relative 0 %   Basophils Absolute 0.0 0.0 - 0.1 K/uL  Urinalysis, Routine w reflex microscopic  Result Value Ref Range   Color, Urine YELLOW YELLOW   APPearance CLEAR CLEAR   Specific Gravity, Urine 1.020 1.005 - 1.030   pH 5.0 5.0 - 8.0   Glucose, UA NEGATIVE NEGATIVE mg/dL   Hgb urine dipstick NEGATIVE NEGATIVE   Bilirubin Urine NEGATIVE NEGATIVE   Ketones, ur NEGATIVE NEGATIVE mg/dL   Protein, ur NEGATIVE NEGATIVE mg/dL   Nitrite NEGATIVE NEGATIVE   Leukocytes, UA NEGATIVE NEGATIVE   Ct Abdomen Pelvis Wo Contrast Result Date: 04/24/2017 CLINICAL DATA:  Vomiting and diarrhea x3 episodes this morning. Patient was seen for allergic reaction yesterday. Concern for dehydration. EXAM: CT ABDOMEN AND PELVIS WITHOUT CONTRAST TECHNIQUE: Multidetector CT imaging of the abdomen and pelvis was performed following the standard protocol without IV contrast.  COMPARISON:  02/16/2012 pelvic ultrasound FINDINGS: Lower chest: Top normal heart size without pericardial effusion. The visualized thoracic aorta demonstrates minimal atherosclerosis. There is  coronary arteriosclerosis along the left circumflex and LAD. Hepatobiliary: Biliary sludge and calculi are identified within the gallbladder. No pericholecystic fluid or wall thickening. The unenhanced liver is unremarkable. Pancreas: No ductal dilatation, mass or inflammation given limitations of a noncontrast study. Spleen: Normal size spleen without focal mass. Adrenals/Urinary Tract: Normal bilateral adrenal glands. Punctate nonobstructing calculus in the lower pole of the right kidney. No hydroureteronephrosis. Bladder unremarkable for degree of distention. Stomach/Bowel: The stomach is distended with ingested food. There is normal small rotation. Mild fluid-filled distention of small bowel without mechanical source of bowel obstruction. Findings may reflect a mild small bowel enteritis. Normal appendix visualized. Colonic diverticulosis without acute diverticulitis. Vascular/Lymphatic: Aortoiliac atherosclerosis. No aneurysm. No lymphadenopathy. Reproductive: Calcified uterine fibroids are noted. No adnexal mass. Other: Tiny umbilical fat containing hernia. No free air nor free fluid. Musculoskeletal: Degenerative disc disease L4-5 and L5-S1 with left central to foraminal small disc herniation at L4-5. IMPRESSION: 1. Mild diffuse small bowel distention without mechanical bowel obstruction compatible with a mild small bowel enteritis. 2. Coronary arteriosclerosis. 3. Punctate nonobstructing right lower pole renal calculus. No hydroureteronephrosis. 4. Fibroid uterus. Electronically Signed   By: Tollie Eth M.D.   On: 04/24/2017 17:58   Dg Chest 2 View Result Date: 04/24/2017 CLINICAL DATA:  Nausea and vomiting. EXAM: CHEST - 2 VIEW COMPARISON:  Chest x-ray dated March 10, 2017. FINDINGS: The heart size and mediastinal contours are within normal limits. Normal pulmonary vascularity. No focal consolidation, pleural effusion, or pneumothorax. No acute osseous abnormality. IMPRESSION: No active cardiopulmonary  disease. Electronically Signed   By: Obie Dredge M.D.   On: 04/24/2017 16:51   Ct Head Wo Contrast Result Date: 04/24/2017 CLINICAL DATA:  Altered mental status, dehydration, vomiting. EXAM: CT HEAD WITHOUT CONTRAST TECHNIQUE: Contiguous axial images were obtained from the base of the skull through the vertex without intravenous contrast. COMPARISON:  CT head 04/26/2005. FINDINGS: Brain: No evidence for acute infarction, hemorrhage, mass lesion, hydrocephalus, or extra-axial fluid. Advanced atrophy. Hypoattenuation of white matter, likely small vessel disease. Vascular: Calcification of the cavernous internal carotid arteries consistent with cerebrovascular atherosclerotic disease. No signs of intracranial large vessel occlusion. Skull: Normal. Negative for fracture or focal lesion. Sinuses/Orbits: No acute finding. Other: None. IMPRESSION: Atrophy and small vessel disease, progressive since 2007. No acute intracranial findings. Electronically Signed   By: Elsie Stain M.D.   On: 04/24/2017 20:46    2110:  Pt continues to lay eyes closed, will speak with staff when spoken too. Pt too generally weak to sit/stand or take PO. Pt's family states pt left the hospital yesterday ambulatory and able to feed herself. No N/V or stooling while in the ED, but unable to challenge PO. Will observation admit. T/C returned from [facility] Adela Glimpse, case discussed, including:  HPI, pertinent PM/SHx, VS/PE, dx testing, ED course and treatment:  Agreeable to admit.     Final Clinical Impressions(s) / ED Diagnoses   Final diagnoses:  None    ED Discharge Orders    None       Samuel Jester, DO 04/26/17 1827

## 2017-04-24 NOTE — ED Notes (Signed)
Family at bedside. 

## 2017-04-24 NOTE — ED Triage Notes (Signed)
Patient is from [facility] and [facility]. Per EMS, patient was seen yesterday for an allergic reaction and discharged back to facility. This morning around 8am, she started experiencing vomiting and diarrhea x 3 episodes. Staff is concerned this related to possible dehydration with a negative orthostatic change. Patient denied any complaints to EMS.

## 2017-04-24 NOTE — ED Notes (Signed)
Unable to obtain orthostatic vital signs at this time, unable to ambulate patient as well. Patient extremely lethargic and weak. Provider aware. Will attempt PO challenge at this time.

## 2017-04-24 NOTE — H&P (Signed)
[name]:[id number] DOB: 1940/05/22 DOA: 04/24/2017     PCP: Laurann Montana, MD   [facility]     Neurology   Dr. Everlena Cooper   Patient arrived to ER on 04/24/17 at 1504  Patient coming from:   From facility [facility]   Chief Complaint:  Chief Complaint  Patient presents with  . Dehydration    HPI: [name] is a 77 y.o. female with medical history significant of angioedema secondary to ACE inhibitor, DM 2, dementia, HTN, depression, HLD    Presented with nausea vomiting diarrhea since this morning facility was concerned that patient was getting dehydrated and EMS was called. She herself unsure why she is here denies any complaints unable to provide detailed history secondary to known history of dementia according to EMS she had negative orthostatics.  At some point she did mention that her abdomen was hurting otherwise no chest pain shortness of breath or fevers no black stools no melena and no blood in emesis.  Recent admission from 20th - 21 April secondary to angioedema thought to be due to ACE inhibitor was discharged yesterday back to her nursing home facility she was treated with steroids and famotidine.  She was treated with IV Solu-Medrol Benadryl discharge and prednisone taper and famotidine medications were changed from losartan to amlodipine.  At baseline able to walk and feed self.  Regarding pertinent Chronic problems: 2 diabetes on Lantus and Tradjenta History of dementia on Namenda While in ER: Getting progressively weaker and fatigued Laying down with eyes closed.  Following Medications were ordered in ER: Medications  0.9 %  sodium chloride infusion ( Intravenous New Bag/Given 04/24/17 1602)    Significant initial  Findings: Abnormal Labs Reviewed  COMPREHENSIVE METABOLIC PANEL - Abnormal; Notable for the following components:      Result Value   Glucose, Bld 177 (*)    BUN 30 (*)    Creatinine, Ser 1.04 (*)    Total Protein 8.4 (*)    GFR calc non Af Amer 50 (*)    GFR calc Af Amer 58 (*)    All other components within normal limits  CBC WITH DIFFERENTIAL/PLATELET - Abnormal; Notable for the following components:   Hemoglobin 15.1 (*)    All other components within normal limits     Na 145 K 4.7  Cr   Up from baseline see below Lab Results  Component Value Date   CREATININE 1.04 (H) 04/24/2017   CREATININE 0.84 04/23/2017   CREATININE 0.97 04/22/2017    WBC 9.5  HG/HCT   stable,       Component Value Date/Time   HGB 15.1 (H) 04/24/2017 1545   HCT 45.0 04/24/2017 1545    Lactic Acid, Venous No results found for: LATICACIDVEN  Troponin <0.03   UA  no evidence of UTI      CT HEAD   NON acute  CXR - NON acute  CTabd/pelvis -showing mild small bowel enteritis  ECG:  Personally reviewed by me showing: HR : 97 Rhythm:  NSR  no evidence of ischemic changes QTC 431    ED Triage Vitals [04/24/17 1516]  Enc Vitals Group     BP (!) 159/99     Pulse Rate 100     Resp 20     Temp 97.9 F (36.6 C)     Temp Source Oral     SpO2 99 %     Weight  Height      Head Circumference      Peak Flow      Pain Score      Pain Loc      Pain Edu?      Excl. in GC?   ZOXW(96)@TMAX(24)@       Latest  Blood pressure (!) 171/93, pulse 88, temperature 97.9 F (36.6 C), temperature source Oral, resp. rate 17, SpO2 98 %.   Hospitalist was called for admission for dehydration and AKI   Review of Systems:    Pertinent positives include:  Fatigue, abdominal pain, nausea, vomiting, diarrhea,  Constitutional:  No weight loss, night sweats, Fevers, chills, weight loss  HEENT:  No headaches, Difficulty swallowing,Tooth/dental problems,Sore throat,  No sneezing, itching, ear ache, nasal congestion, post nasal drip,  Cardio-vascular:  No chest pain, Orthopnea, PND, anasarca, dizziness, palpitations.no Bilateral lower extremity swelling  GI:  No heartburn, indigestion,  change in bowel  habits, loss of appetite, melena, blood in stool, hematemesis Resp:  no shortness of breath at rest. No dyspnea on exertion, No excess mucus, no productive cough, No non-productive cough, No coughing up of blood.No change in color of mucus.No wheezing. Skin:  no rash or lesions. No jaundice GU:  no dysuria, change in color of urine, no urgency or frequency. No straining to urinate.  No flank pain.  Musculoskeletal:  No joint pain or no joint swelling. No decreased range of motion. No back pain.  Psych:  No change in mood or affect. No depression or anxiety. No memory loss.  Neuro: no localizing neurological complaints, no tingling, no weakness, no double vision, no gait abnormality, no slurred speech, no confusion  As per HPI otherwise 10 point review of systems negative.   Past Medical History:   Past Medical History:  Diagnosis Date  . Anxiety   . Blepharitis   . Dementia   . Depression   . Diabetes mellitus   . GERD (gastroesophageal reflux disease)   . Heart murmur, systolic   . Hepatitis   . Hyperlipidemia   . Hypertension   . Osteoporosis   . Urinary frequency       Past Surgical History:  Procedure Laterality Date  . COLONOSCOPY N/A 02/16/2012   Procedure: COLONOSCOPY;  Surgeon: Vertell NovakJames L Edwards Jr., MD;  [facility];  Service: Endoscopy;  Laterality: N/A;  . ESOPHAGOGASTRODUODENOSCOPY N/A 02/16/2012   Procedure: ESOPHAGOGASTRODUODENOSCOPY (EGD);  Surgeon: Vertell NovakJames L Edwards Jr., MD;  [facility];  Service: Endoscopy;  Laterality: N/A;  . THYROIDECTOMY     in 2000    Social History:  Ambulatory  Independently      reports that she has never smoked. She has never used smokeless tobacco. She reports that she does not drink alcohol or use drugs.     Family History:   Family History  Problem Relation Age of Onset  . Cancer Sister        brain  . Diabetes Son   . Hypertension Son   . Diabetes Daughter     Allergies: Allergies    Allergen Reactions  . Losartan Swelling    angioedema     Prior to Admission medications   Medication Sig Start Date End Date Taking? Authorizing Provider  amLODipine (NORVASC) 5 MG tablet Take 5 mg by mouth daily.   Yes [provider]  calcium-vitamin D (OSCAL WITH D) 500-200 MG-UNIT per tablet Take 1 tablet by mouth daily. 02/17/12  Yes Rai, Delene Ruffiniipudeep K, MD  cetirizine (  ZYRTEC) 10 MG tablet Take 10 mg by mouth daily.   Yes [provider]  donepezil (ARICEPT) 5 MG tablet Take 1 tablet (5 mg total) by mouth at bedtime. 06/06/11  Yes Floydene Flock, MD  glimepiride (AMARYL) 4 MG tablet Take 4 mg by mouth daily.    Yes [provider]  haloperidol (HALDOL) 2 MG/ML solution Take 0.5 mg by mouth at bedtime.   Yes [provider]  hydrALAZINE (APRESOLINE) 10 MG tablet Take 10 mg by mouth 3 (three) times daily.   Yes [provider]  insulin glargine (LANTUS) 100 UNIT/ML injection Inject 15 Units into the skin at bedtime.    Yes [provider]  linagliptin (TRADJENTA) 5 MG TABS tablet Take 5 mg by mouth daily.   Yes [provider]  memantine (NAMENDA XR) 28 MG CP24 24 hr capsule Take 28 mg by mouth daily.   Yes [provider]  metoprolol succinate (TOPROL-XL) 25 MG 24 hr tablet Take 25 mg by mouth daily.   Yes [provider]  mirtazapine (REMERON) 15 MG tablet Take 15 mg by mouth at bedtime.   Yes [provider]  omeprazole (PRILOSEC) 40 MG capsule Take 1 capsule (40 mg total) by mouth daily. 06/06/11  Yes Floydene Flock, MD  alendronate (FOSAMAX) 70 MG tablet Take 70 mg by mouth every Tuesday. Take with a full glass of water on an empty stomach.     [provider]  amLODipine (NORVASC) 2.5 MG tablet Take 2 tablets (5 mg total) by mouth daily. Patient not taking: Reported on 04/24/2017 04/24/17   Elgergawy, Leana Roe, MD  famotidine (PEPCID) 20 MG tablet Take 1 tablet (20 mg total) by mouth 2  (two) times daily for 15 days. 04/23/17 05/08/17  Elgergawy, Leana Roe, MD  metFORMIN (GLUCOPHAGE-XR) 500 MG 24 hr tablet Take 500 mg by mouth every evening.    [provider]  predniSONE (STERAPRED UNI-PAK 21 TAB) 10 MG (21) TBPK tablet Use per package instruction 04/23/17   Elgergawy, Leana Roe, MD   Physical Exam: Blood pressure (!) 171/93, pulse 88, temperature 97.9 F (36.6 C), temperature source Oral, resp. rate 17, SpO2 98 %. 1. General:  in No Acute distress   Chronically ill -appearing 2. Psychological: somnolent not  Oriented 3. Head/ENT:   Dry Mucous Membranes                          Head Non traumatic, neck supple                          Poor Dentition 4. SKIN:   decreased Skin turgor,  Skin clean Dry and intact no rash 5. Heart: Regular rate and rhythm no Murmur, no Rub or gallop 6. Lungs:  no wheezes or crackles   7. Abdomen: Soft,  non-tender, Non distended bowel sounds present 8. Lower extremities: no clubbing, cyanosis, or edema 9. Neurologically Grossly intact, moving all 4 extremities equally  10. MSK: Normal range of motion   LABS:     Recent Labs  Lab 04/22/17 1158 04/23/17 0821 04/24/17 1545  WBC 5.8 13.0* 9.5  NEUTROABS 3.3  --  7.6  HGB 12.0 12.7 15.1*  HCT 37.6 38.3 45.0  MCV 94.7 93.9 96.4  PLT 197 218 247   Basic Metabolic Panel: Recent Labs  Lab 04/22/17 1158 04/23/17 0821 04/24/17 1545  NA 141 139 145  K 3.7 3.9 4.7  CL 104 104 107  CO2 27 24 25   GLUCOSE 112* 172* 177*  BUN 12 13 30*  CREATININE 0.97 0.84 1.04*  CALCIUM 9.2 9.1 9.7      Recent Labs  Lab 04/24/17 1545  AST 31  ALT 31  ALKPHOS 86  BILITOT 0.8  PROT 8.4*  ALBUMIN 4.0   Recent Labs  Lab 04/24/17 1545  LIPASE 40   No results for input(s): AMMONIA in the last 168 hours.    HbA1C: No results for input(s): HGBA1C in the last 72 hours. CBG: Recent Labs  Lab 04/22/17 1800 04/22/17 2150 04/23/17 0739 04/23/17 1223  GLUCAP 170* 223* 193* 189*       Urine analysis:    Component Value Date/Time   COLORURINE YELLOW 04/24/2017 1545   APPEARANCEUR CLEAR 04/24/2017 1545   LABSPEC 1.020 04/24/2017 1545   PHURINE 5.0 04/24/2017 1545   GLUCOSEU NEGATIVE 04/24/2017 1545   HGBUR NEGATIVE 04/24/2017 1545   HGBUR negative 01/28/2009 1523   BILIRUBINUR NEGATIVE 04/24/2017 1545   BILIRUBINUR NEG 04/04/2011 0903   KETONESUR NEGATIVE 04/24/2017 1545   PROTEINUR NEGATIVE 04/24/2017 1545   UROBILINOGEN 0.2 04/04/2011 0903   UROBILINOGEN 0.2 01/28/2009 1523   NITRITE NEGATIVE 04/24/2017 1545   LEUKOCYTESUR NEGATIVE 04/24/2017 1545       Cultures:    Component Value Date/Time   SDES  03/09/2017 2109    URINE, RANDOM Performed at [facility], [address] [place], [place] [address]    SPECREQUEST  03/09/2017 2109    NONE Performed at [facility], [address], [place], [place] [address]    CULT  03/09/2017 2109    NO GROWTH Performed at [facility], [address], [place], [place] [address]    REPTSTATUS 03/11/2017 FINAL 03/09/2017 2109     Radiological Exams on Admission: Ct Abdomen Pelvis Wo Contrast  Result Date: 04/24/2017 CLINICAL DATA:  Vomiting and diarrhea x3 episodes this morning. Patient was seen for allergic reaction yesterday. Concern for dehydration. EXAM: CT ABDOMEN AND PELVIS WITHOUT CONTRAST TECHNIQUE: Multidetector CT imaging of the abdomen and pelvis was performed following the standard protocol without IV contrast. COMPARISON:  02/16/2012 pelvic ultrasound FINDINGS: Lower chest: Top normal heart size without pericardial effusion. The visualized thoracic aorta demonstrates minimal atherosclerosis. There is coronary arteriosclerosis along the left circumflex and LAD. Hepatobiliary: Biliary sludge and calculi are identified within the gallbladder. No pericholecystic fluid or wall thickening. The unenhanced liver is unremarkable. Pancreas: No ductal dilatation,  mass or inflammation given limitations of a noncontrast study. Spleen: Normal size spleen without focal mass. Adrenals/Urinary Tract: Normal bilateral adrenal glands. Punctate nonobstructing calculus in the lower pole of the right kidney. No hydroureteronephrosis. Bladder unremarkable for degree of distention. Stomach/Bowel: The stomach is distended with ingested food. There is normal small rotation. Mild fluid-filled distention of small bowel without mechanical source of bowel obstruction. Findings may reflect a mild small bowel enteritis. Normal appendix visualized. Colonic diverticulosis without acute diverticulitis. Vascular/Lymphatic: Aortoiliac atherosclerosis. No aneurysm. No lymphadenopathy. Reproductive: Calcified uterine fibroids are noted. No adnexal mass. Other: Tiny umbilical fat containing hernia. No free air nor free fluid. Musculoskeletal: Degenerative disc disease L4-5 and L5-S1 with left central to foraminal small disc herniation at L4-5. IMPRESSION: 1. Mild diffuse small bowel distention without mechanical bowel obstruction compatible with a mild small bowel enteritis. 2. Coronary arteriosclerosis. 3. Punctate nonobstructing right lower pole renal calculus. No hydroureteronephrosis. 4. Fibroid uterus. Electronically Signed  By: Tollie Eth M.D.   On: 04/24/2017 17:58   Dg Chest 2 View  Result Date: 04/24/2017 CLINICAL DATA:  Nausea and vomiting. EXAM: CHEST - 2 VIEW COMPARISON:  Chest x-ray dated March 10, 2017. FINDINGS: The heart size and mediastinal contours are within normal limits. Normal pulmonary vascularity. No focal consolidation, pleural effusion, or pneumothorax. No acute osseous abnormality. IMPRESSION: No active cardiopulmonary disease. Electronically Signed   By: Obie Dredge M.D.   On: 04/24/2017 16:51   Ct Head Wo Contrast  Result Date: 04/24/2017 CLINICAL DATA:  Altered mental status, dehydration, vomiting. EXAM: CT HEAD WITHOUT CONTRAST TECHNIQUE: Contiguous axial  images were obtained from the base of the skull through the vertex without intravenous contrast. COMPARISON:  CT head 04/26/2005. FINDINGS: Brain: No evidence for acute infarction, hemorrhage, mass lesion, hydrocephalus, or extra-axial fluid. Advanced atrophy. Hypoattenuation of white matter, likely small vessel disease. Vascular: Calcification of the cavernous internal carotid arteries consistent with cerebrovascular atherosclerotic disease. No signs of intracranial large vessel occlusion. Skull: Normal. Negative for fracture or focal lesion. Sinuses/Orbits: No acute finding. Other: None. IMPRESSION: Atrophy and small vessel disease, progressive since 2007. No acute intracranial findings. Electronically Signed   By: Elsie Stain M.D.   On: 04/24/2017 20:46    Chart has been reviewed    Assessment/Plan  77 y.o. female with medical history significant of angioedema secondary to ACE inhibitor, DM 2, dementia, HTN, depression, HLD  Admitted for dehydration mild AKI  Present on Admission: Acute encephalopathy -    most likely multifactorial secondary to combination of  infection   mild dehydration secondary to decreased by mouth intake,     - Will rehydrate   - Hold contributing medications   - if no improvement may need further imaging to evaluate for CNS pathology CT of the head unremarkable  Enteritis will obtain gastric panel given recent admission check for C. difficile  . Dementia, Alzheimer's, with behavior disturbance chronic given GI complaints we will hold Namenda and Aricept for now . Diabetes mellitus out of control (HCC) order sliding scale while decreased p.o. intake decrease Lantus to 10 units  adjust as needed, hold p.o. medications . Essential hypertension continue Norvasc . AKI (acute kidney injury) (HCC) rehydrate hold nephrotoxic medications . Dehydration will rehydrate . Angioedema -it is unclear if patient ever got her steroids will resume prednisone pack currently no  evidence of angioedema.  CT findings not consistent with edema  Other plan as per orders.  DVT prophylaxis:    Lovenox     Code Status:  FULL CODE   as per family  I had personally discussed CODE STATUS with patient and family  Family Communication:   Family  at  Bedside  plan of care was discussed with   Daughter,   Disposition Plan:                           Back to current facility when stable                                                Would benefit from PT/OT eval prior to DC  ordered                      Social Work   consulted  Consults called: none   Admission status:  obs   Level of care   meds        Khari Mally 04/24/2017, 9:53 PM    [facility]  Pager [phone]   after 2 AM please page floor coverage PA If 7AM-7PM, please contact the day team taking care of the patient  [email or web address]  Password [email or web address]

## 2017-04-24 NOTE — ED Notes (Signed)
Bed: [id number] Expected date:  Expected time:  Means of arrival:  Comments: EMS 4878 F N/V dehydration

## 2017-04-25 ENCOUNTER — Other Ambulatory Visit: Payer: Self-pay

## 2017-04-25 DIAGNOSIS — E1165 Type 2 diabetes mellitus with hyperglycemia: Secondary | ICD-10-CM

## 2017-04-25 DIAGNOSIS — I10 Essential (primary) hypertension: Secondary | ICD-10-CM

## 2017-04-25 DIAGNOSIS — G309 Alzheimer's disease, unspecified: Secondary | ICD-10-CM

## 2017-04-25 DIAGNOSIS — F0281 Dementia in other diseases classified elsewhere with behavioral disturbance: Secondary | ICD-10-CM

## 2017-04-25 DIAGNOSIS — G9340 Encephalopathy, unspecified: Secondary | ICD-10-CM

## 2017-04-25 DIAGNOSIS — N179 Acute kidney failure, unspecified: Secondary | ICD-10-CM

## 2017-04-25 DIAGNOSIS — Z794 Long term (current) use of insulin: Secondary | ICD-10-CM | POA: Diagnosis not present

## 2017-04-25 LAB — CBC
HCT: 40 % (ref 36.0–46.0)
Hemoglobin: 13.3 g/dL (ref 12.0–15.0)
MCH: 31.8 pg (ref 26.0–34.0)
MCHC: 33.3 g/dL (ref 30.0–36.0)
MCV: 95.7 fL (ref 78.0–100.0)
PLATELETS: 194 10*3/uL (ref 150–400)
RBC: 4.18 MIL/uL (ref 3.87–5.11)
RDW: 13.6 % (ref 11.5–15.5)
WBC: 7.3 10*3/uL (ref 4.0–10.5)

## 2017-04-25 LAB — COMPREHENSIVE METABOLIC PANEL
ALK PHOS: 66 U/L (ref 38–126)
ALT: 26 U/L (ref 14–54)
AST: 30 U/L (ref 15–41)
Albumin: 3.2 g/dL — ABNORMAL LOW (ref 3.5–5.0)
Anion gap: 10 (ref 5–15)
BILIRUBIN TOTAL: 0.7 mg/dL (ref 0.3–1.2)
BUN: 24 mg/dL — ABNORMAL HIGH (ref 6–20)
CALCIUM: 8.6 mg/dL — AB (ref 8.9–10.3)
CO2: 22 mmol/L (ref 22–32)
CREATININE: 0.97 mg/dL (ref 0.44–1.00)
Chloride: 111 mmol/L (ref 101–111)
GFR calc Af Amer: >60 mL/min (ref >60)
GFR, EST NON AFRICAN AMERICAN: 55 mL/min — AB (ref >60)
Glucose, Bld: 105 mg/dL — ABNORMAL HIGH (ref 65–99)
Potassium: 4.2 mmol/L (ref 3.5–5.1)
Sodium: 143 mmol/L (ref 135–145)
TOTAL PROTEIN: 6.7 g/dL (ref 6.5–8.1)

## 2017-04-25 LAB — HEMOGLOBIN A1C
HEMOGLOBIN A1C: 5.6 % (ref 4.8–5.6)
MEAN PLASMA GLUCOSE: 114.02 mg/dL

## 2017-04-25 LAB — TROPONIN I
Troponin I: <0.03 ng/mL (ref <0.03)
Troponin I: <0.03 ng/mL (ref <0.03)

## 2017-04-25 LAB — GLUCOSE, CAPILLARY
GLUCOSE-CAPILLARY: 93 mg/dL (ref 65–99)
GLUCOSE-CAPILLARY: 99 mg/dL (ref 65–99)
Glucose-Capillary: 166 mg/dL — ABNORMAL HIGH (ref 65–99)
Glucose-Capillary: 175 mg/dL — ABNORMAL HIGH (ref 65–99)
Glucose-Capillary: 245 mg/dL — ABNORMAL HIGH (ref 65–99)
Glucose-Capillary: 288 mg/dL — ABNORMAL HIGH (ref 65–99)
Glucose-Capillary: 84 mg/dL (ref 65–99)

## 2017-04-25 LAB — PHOSPHORUS: Phosphorus: 2.2 mg/dL — ABNORMAL LOW (ref 2.5–4.6)

## 2017-04-25 LAB — URINE CULTURE: Culture: NO GROWTH

## 2017-04-25 LAB — TSH: TSH: 2.247 u[IU]/mL (ref 0.350–4.500)

## 2017-04-25 LAB — MAGNESIUM: Magnesium: 1.7 mg/dL (ref 1.7–2.4)

## 2017-04-25 LAB — MRSA PCR SCREENING: MRSA BY PCR: NEGATIVE

## 2017-04-25 MED ORDER — PREDNISONE 10 MG (21) PO TBPK
10.0000 mg | ORAL_TABLET | Freq: Four times a day (QID) | ORAL | Status: DC
  Administered 2017-04-27 (×2): 10 mg via ORAL

## 2017-04-25 MED ORDER — ACETAMINOPHEN 325 MG PO TABS
650.0000 mg | ORAL_TABLET | Freq: Four times a day (QID) | ORAL | Status: DC | PRN
  Administered 2017-04-26: 650 mg via ORAL
  Filled 2017-04-25: qty 2

## 2017-04-25 MED ORDER — PREDNISONE 10 MG (21) PO TBPK
10.0000 mg | ORAL_TABLET | Freq: Three times a day (TID) | ORAL | Status: AC
  Administered 2017-04-26 (×3): 10 mg via ORAL

## 2017-04-25 MED ORDER — INSULIN GLARGINE 100 UNIT/ML ~~LOC~~ SOLN
10.0000 [IU] | Freq: Every day | SUBCUTANEOUS | Status: DC
  Administered 2017-04-25 – 2017-04-26 (×2): 10 [IU] via SUBCUTANEOUS
  Filled 2017-04-25 (×4): qty 0.1

## 2017-04-25 MED ORDER — HYDRALAZINE HCL 10 MG PO TABS
10.0000 mg | ORAL_TABLET | Freq: Three times a day (TID) | ORAL | Status: DC
  Administered 2017-04-25 – 2017-04-27 (×7): 10 mg via ORAL
  Filled 2017-04-25 (×8): qty 1

## 2017-04-25 MED ORDER — ALBUTEROL SULFATE (2.5 MG/3ML) 0.083% IN NEBU: 2.5000 mg | INHALATION_SOLUTION | RESPIRATORY_TRACT | Status: DC | PRN

## 2017-04-25 MED ORDER — PREDNISONE 10 MG (21) PO TBPK
20.0000 mg | ORAL_TABLET | Freq: Every morning | ORAL | Status: AC
  Administered 2017-04-25: 20 mg via ORAL
  Filled 2017-04-25: qty 21

## 2017-04-25 MED ORDER — AMLODIPINE BESYLATE 5 MG PO TABS
5.0000 mg | ORAL_TABLET | Freq: Every day | ORAL | Status: DC
  Administered 2017-04-25 – 2017-04-27 (×3): 5 mg via ORAL
  Filled 2017-04-25 (×3): qty 1

## 2017-04-25 MED ORDER — SODIUM CHLORIDE 0.9 % IV SOLN
INTRAVENOUS | Status: AC
  Administered 2017-04-25 (×2): via INTRAVENOUS

## 2017-04-25 MED ORDER — ENOXAPARIN SODIUM 30 MG/0.3ML ~~LOC~~ SOLN
30.0000 mg | SUBCUTANEOUS | Status: DC
  Administered 2017-04-25 – 2017-04-27 (×3): 30 mg via SUBCUTANEOUS
  Filled 2017-04-25 (×3): qty 0.3

## 2017-04-25 MED ORDER — ONDANSETRON HCL 4 MG/2ML IJ SOLN
4.0000 mg | Freq: Four times a day (QID) | INTRAMUSCULAR | Status: DC | PRN
Start: 2017-04-25 — End: 2017-04-27
  Administered 2017-04-26: 4 mg via INTRAVENOUS
  Filled 2017-04-25: qty 2

## 2017-04-25 MED ORDER — PREDNISONE 10 MG (21) PO TBPK
10.0000 mg | ORAL_TABLET | ORAL | Status: AC
  Administered 2017-04-25: 10 mg via ORAL

## 2017-04-25 MED ORDER — INSULIN ASPART 100 UNIT/ML ~~LOC~~ SOLN
0.0000 [IU] | SUBCUTANEOUS | Status: DC
  Administered 2017-04-25: 2 [IU] via SUBCUTANEOUS
  Administered 2017-04-25: 5 [IU] via SUBCUTANEOUS
  Administered 2017-04-25: 3 [IU] via SUBCUTANEOUS
  Administered 2017-04-26: 1 [IU] via SUBCUTANEOUS
  Administered 2017-04-26 (×2): 2 [IU] via SUBCUTANEOUS
  Administered 2017-04-26: 3 [IU] via SUBCUTANEOUS
  Administered 2017-04-26: 2 [IU] via SUBCUTANEOUS
  Administered 2017-04-26: 5 [IU] via SUBCUTANEOUS
  Administered 2017-04-27: 1 [IU] via SUBCUTANEOUS
  Administered 2017-04-27: 3 [IU] via SUBCUTANEOUS
  Administered 2017-04-27: 2 [IU] via SUBCUTANEOUS
  Administered 2017-04-27: 1 [IU] via SUBCUTANEOUS

## 2017-04-25 MED ORDER — ONDANSETRON HCL 4 MG PO TABS: 4.0000 mg | ORAL_TABLET | Freq: Four times a day (QID) | ORAL | Status: DC | PRN

## 2017-04-25 MED ORDER — ACETAMINOPHEN 650 MG RE SUPP: 650.0000 mg | Freq: Four times a day (QID) | RECTAL | Status: DC | PRN

## 2017-04-25 MED ORDER — GUAIFENESIN ER 600 MG PO TB12
600.0000 mg | ORAL_TABLET | Freq: Two times a day (BID) | ORAL | Status: DC
  Administered 2017-04-25 – 2017-04-27 (×5): 600 mg via ORAL
  Filled 2017-04-25 (×5): qty 1

## 2017-04-25 MED ORDER — PREDNISONE 10 MG (21) PO TBPK
20.0000 mg | ORAL_TABLET | Freq: Every evening | ORAL | Status: AC
  Administered 2017-04-26: 20 mg via ORAL
  Filled 2017-04-25: qty 21

## 2017-04-25 MED ORDER — ORAL CARE MOUTH RINSE
15.0000 mL | Freq: Two times a day (BID) | OROMUCOSAL | Status: DC
  Administered 2017-04-25 – 2017-04-26 (×4): 15 mL via OROMUCOSAL

## 2017-04-25 MED ORDER — MIRTAZAPINE 15 MG PO TABS
15.0000 mg | ORAL_TABLET | Freq: Every day | ORAL | Status: DC
  Administered 2017-04-25 – 2017-04-26 (×2): 15 mg via ORAL
  Filled 2017-04-25 (×2): qty 1

## 2017-04-25 MED ORDER — PREDNISONE 10 MG (21) PO TBPK
20.0000 mg | ORAL_TABLET | Freq: Every evening | ORAL | Status: AC
  Administered 2017-04-25: 20 mg via ORAL

## 2017-04-25 NOTE — Care Management Obs Status (Signed)
MEDICARE OBSERVATION STATUS NOTIFICATION   Patient Details  Name: Michaela BridgeMary H Garson MRN: [id number] Date of Birth: 02-13-1940   Medicare Observation Status Notification Given:  Yes    Golda AcreDavis, Rhonda Lynn, RN 04/25/2017, 1:48 PM

## 2017-04-25 NOTE — Evaluation (Signed)
Occupational Therapy Evaluation Patient Details Name: [name] MRN: [id number] DOB: [birth year] Today's Date: 04/25/2017    History of Present Illness Pt was admitted for dehydration and angioedema.  PMH:  dementia, DM, HTN,    Clinical Impression   This 77 year old female was admitted for the above. She was recently at [facility].  She lives at [facility] and per notes, fed herself and walked by herself.  Pt has declined in functional mobility. Will trial OT in acute setting focusing on mobility related to ADLs to decrease burden of care.  Pt was unable to stand with +1 assistance and needed max A for bed mobility.      Follow Up Recommendations  SNF    Equipment Recommendations  None recommended by OT    Recommendations for Other Services       Precautions / Restrictions Precautions Precautions: Fall Restrictions Weight Bearing Restrictions: No      Mobility Bed Mobility Overal bed mobility: Needs Assistance Bed Mobility: Supine to Sit;Sit to Supine     Supine to sit: Max assist Sit to supine: Max assist   General bed mobility comments: poor initiation.  Pt did assist with trunk  Transfers                 General transfer comment: attempted to stand with +1, therapist in front of her.  Pt did not initiate any movement to assist    Balance Overall balance assessment: Needs assistance     Sitting balance - Comments: sits unsupported but drifts posteriorly; corrects with cues                                   ADL either performed or assessed with clinical judgement   ADL Overall ADL's : Needs assistance/impaired Eating/Feeding: Set up;Sitting Eating/Feeding Details (indicate cue type and reason): pt has a lot of spillage; full set up Grooming: Set up;Wash/dry hands;Wash/dry face;Sitting                                 General ADL Comments: pt has assist for adls at baseline.  Sat EOB and pt drifts posteriorly, but able to  correct with cues. Attempted to stand, but pt did not help     Vision         Perception     Praxis      Pertinent Vitals/Pain Pain Assessment: Faces Pain Score: 0-No pain Faces Pain Scale: No hurt     Hand Dominance Right   Extremity/Trunk Assessment Upper Extremity Assessment Upper Extremity Assessment: Generalized weakness(doesn't move L much:  tight)           Communication Communication Communication: No difficulties   Cognition Arousal/Alertness: Awake/alert Behavior During Therapy: WFL for tasks assessed/performed Overall Cognitive Status: No family/caregiver present to determine baseline cognitive functioning                                 General Comments: h/o dementia.  poor initiation with multimodal cues.  Responds to name   General Comments       Exercises     Shoulder Instructions      Home Living Family/patient expects to be discharged to:: Assisted living  Additional Comments: from [facility]. Per notes from admission last month, pt ambulated and fed herself. She had help for bathing/dressing      Prior Functioning/Environment Level of Independence: Needs assistance                 OT Problem List:        OT Treatment/Interventions: Self-care/ADL training;Balance training;Cognitive remediation/compensation;Therapeutic activities    OT Goals(Current goals can be found in the care plan section) Acute Rehab OT Goals Patient Stated Goal: none stated; agreeable to sitting up OT Goal Formulation: With patient Time For Goal Achievement: 05/09/17 Potential to Achieve Goals: Poor ADL Goals Pt Will Transfer to Toilet: with mod assist;with +2 assist;bedside commode;stand pivot transfer Additional ADL Goal #1: pt will go from sit to stand with mod A in preparation for toilet transfers Additional ADL Goal #2: pt will perform bed mobility with mod A in preparation for toilet  transfers/adls  OT Frequency: Min 1X/week(trial)   Barriers to D/C:            Co-evaluation              AM-PAC PT "6 Clicks" Daily Activity     Outcome Measure Help from another person eating meals?: A Little Help from another person taking care of personal grooming?: A Little Help from another person toileting, which includes using toliet, bedpan, or urinal?: Total Help from another person bathing (including washing, rinsing, drying)?: Total Help from another person to put on and taking off regular upper body clothing?: Total Help from another person to put on and taking off regular lower body clothing?: Total 6 Click Score: 10   End of Session Nurse Communication: (bed needs to be changed)  Activity Tolerance: Patient limited by fatigue Patient left: in bed;with call bell/phone within reach;with bed alarm set  OT Visit Diagnosis: Muscle weakness (generalized) (M62.81);Cognitive communication deficit (R41.841)                Time: 1610-9604: 0834-0854 OT Time Calculation (min): 20 min Charges:  OT General Charges $OT Visit: 1 Visit OT Evaluation $OT Eval Moderate Complexity: 1 Mod G-Codes:     AlbiaMaryellen Sharlynn Seckinger, OTR/L [phone] 04/25/2017  [name] 04/25/2017, 9:27 AM

## 2017-04-25 NOTE — Care Management CC44 (Signed)
Condition Code 44 Documentation Completed  Patient Details  Name: [name] MRN: [id number] Date of Birth: Dec 19, 1940   Condition Code 44 given:    Patient signature on Condition Code 44 notice:    Documentation of 2 MD's agreement:    Code 44 added to claim:       Golda Acreavis, Julietta Batterman Lynn, RN 04/25/2017, 1:52 PM

## 2017-04-25 NOTE — Progress Notes (Addendum)
PROGRESS NOTE    [name] Rutten  UJW:[id number] DOB: 24-Jan-1940 DOA: 04/24/2017 PCP: Laurann MontanaWhite, Cynthia, MD   Brief Narrative: [name] Mccahill is a 77 y.o. female with a history of angioedema secondary to ARB, diabetes mellitus, dementia, hypertension, depression, hyperlipidemia.  Patient presented secondary to confusion and lethargy.  Found to be dehydrated and given IV fluids.  Workup for confusion so far is unremarkable.  At some initial improvement the morning of 4/23.  Urine culture pending.   Assessment & Plan:   Active Problems:   Diabetes mellitus out of control (HCC)   Dementia, Alzheimer's, with behavior disturbance   Essential hypertension   Angioedema   AKI (acute kidney injury) (HCC)   Dehydration   Acute encephalopathy   Acute encephalopathy Likely metabolic. Given IV fluids. ?infectious. Urine culture pending. ?worsening dementia. ?medication related. -Urine culture -Neuro checks  Enteritis Small bowel, mild. No diarrhea. -Monitor clinically  Dementia On Namenda and Aricept as an outpatient. Held secondary to GI complaints  Diabetes mellitus, insulin dependent Controlled this morning. Hemoglobin A1C of 5.6% which is likely too well controlled, predisposing to hypoglycemia -Continue decreased dose of Lantus 10 units and SSI  Essential hypertension Uncontrolled overnight. Better this morning -Continue Norvasc and hydralazine  Acute kidney injury Resolved.  Dehydration Given IV fluids  History of angioedema Thought secondary to losartan. On prednisone taper -Continue prednisone  Murmur -Transthoracic Echocardiogram   DVT prophylaxis: Lovenox Code Status:   Code Status: Full Code Family Communication: Daughter on telephone Disposition Plan: Discharge to SNF when mental status improved.   Consultants:   None  Procedures:   None  Antimicrobials:  None    Subjective: No concerns.  Objective: Vitals:   04/25/17 0001 04/25/17 0009  04/25/17 0418 04/25/17 0935  BP: (!) 174/93  (!) 156/80 133/72  Pulse: 87  70   Resp: 20     Temp: 98.9 F (37.2 C)  98.7 F (37.1 C)   TempSrc: Oral  Oral   SpO2: 98%  97%   Weight:  58.1 kg (128 lb 1.4 oz)    Height:  5\' 3"  (1.6 m)      Intake/Output Summary (Last 24 hours) at 04/25/2017 1403 Last data filed at 04/25/2017 0907 Gross per 24 hour  Intake 1601.67 ml  Output 140 ml  Net 1461.67 ml   Filed Weights   04/25/17 0009  Weight: 58.1 kg (128 lb 1.4 oz)    Examination:  General exam: Appears calm and comfortable Respiratory system: Clear to auscultation. Respiratory effort normal. Cardiovascular system: S1 & S2 heard, RRR. 2/6 systolic murmur Gastrointestinal system: Abdomen is nondistended, soft and nontender. No organomegaly or masses felt. Normal bowel sounds heard. Central nervous system: Alert and oriented to person only. Extremities: No edema. No calf tenderness Skin: No cyanosis. No rashes Psychiatry: Judgement and insight appear impaired. Flat affect    Data Reviewed: I have personally reviewed following labs and imaging studies  CBC: Recent Labs  Lab 04/22/17 1158 04/23/17 0821 04/24/17 1545 04/25/17 0051  WBC 5.8 13.0* 9.5 7.3  NEUTROABS 3.3  --  7.6  --   HGB 12.0 12.7 15.1* 13.3  HCT 37.6 38.3 45.0 40.0  MCV 94.7 93.9 96.4 95.7  PLT 197 218 247 194   Basic Metabolic Panel: Recent Labs  Lab 04/22/17 1158 04/23/17 0821 04/24/17 1545 04/25/17 0051  NA 141 139 145 143  K 3.7 3.9 4.7 4.2  CL 104 104 107 111  CO2 27 24 25 22   GLUCOSE  112* 172* 177* 105*  BUN 12 13 30* 24*  CREATININE 0.97 0.84 1.04* 0.97  CALCIUM 9.2 9.1 9.7 8.6*  MG  --   --   --  1.7  PHOS  --   --   --  2.2*   GFR: Estimated Creatinine Clearance: 39.5 mL/min (by C-G formula based on SCr of 0.97 mg/dL). Liver Function Tests: Recent Labs  Lab 04/24/17 1545 04/25/17 0051  AST 31 30  ALT 31 26  ALKPHOS 86 66  BILITOT 0.8 0.7  PROT 8.4* 6.7  ALBUMIN 4.0 3.2*     Recent Labs  Lab 04/24/17 1545  LIPASE 40   No results for input(s): AMMONIA in the last 168 hours. Coagulation Profile: No results for input(s): INR, PROTIME in the last 168 hours. Cardiac Enzymes: Recent Labs  Lab 04/24/17 1545 04/25/17 0051 04/25/17 0617  TROPONINI <0.03 <0.03 <0.03   BNP (last 3 results) No results for input(s): PROBNP in the last 8760 hours. HbA1C: Recent Labs    04/25/17 0051  HGBA1C 5.6   CBG: Recent Labs  Lab 04/23/17 1223 04/25/17 0014 04/25/17 0416 04/25/17 0738 04/25/17 1127  GLUCAP 189* 99 93 84 175*   Lipid Profile: No results for input(s): CHOL, HDL, LDLCALC, TRIG, CHOLHDL, LDLDIRECT in the last 72 hours. Thyroid Function Tests: Recent Labs    04/25/17 0051  TSH 2.247   Anemia Panel: No results for input(s): VITAMINB12, FOLATE, FERRITIN, TIBC, IRON, RETICCTPCT in the last 72 hours. Sepsis Labs: No results for input(s): PROCALCITON, LATICACIDVEN in the last 168 hours.  Recent Results (from the past 240 hour(s))  MRSA PCR Screening     Status: None   Collection Time: 04/25/17  2:24 AM  Result Value Ref Range Status   MRSA by PCR NEGATIVE NEGATIVE Final    Comment:        The GeneXpert MRSA Assay (FDA approved for NASAL specimens only), is one component of a comprehensive MRSA colonization surveillance program. It is not intended to diagnose MRSA infection nor to guide or monitor treatment for MRSA infections. Performed at [facility], [address], [place], [place] [address]          Radiology Studies: Ct Abdomen Pelvis Wo Contrast  Result Date: 04/24/2017 CLINICAL DATA:  Vomiting and diarrhea x3 episodes this morning. Patient was seen for allergic reaction yesterday. Concern for dehydration. EXAM: CT ABDOMEN AND PELVIS WITHOUT CONTRAST TECHNIQUE: Multidetector CT imaging of the abdomen and pelvis was performed following the standard protocol without IV contrast. COMPARISON:  02/16/2012  pelvic ultrasound FINDINGS: Lower chest: Top normal heart size without pericardial effusion. The visualized thoracic aorta demonstrates minimal atherosclerosis. There is coronary arteriosclerosis along the left circumflex and LAD. Hepatobiliary: Biliary sludge and calculi are identified within the gallbladder. No pericholecystic fluid or wall thickening. The unenhanced liver is unremarkable. Pancreas: No ductal dilatation, mass or inflammation given limitations of a noncontrast study. Spleen: Normal size spleen without focal mass. Adrenals/Urinary Tract: Normal bilateral adrenal glands. Punctate nonobstructing calculus in the lower pole of the right kidney. No hydroureteronephrosis. Bladder unremarkable for degree of distention. Stomach/Bowel: The stomach is distended with ingested food. There is normal small rotation. Mild fluid-filled distention of small bowel without mechanical source of bowel obstruction. Findings may reflect a mild small bowel enteritis. Normal appendix visualized. Colonic diverticulosis without acute diverticulitis. Vascular/Lymphatic: Aortoiliac atherosclerosis. No aneurysm. No lymphadenopathy. Reproductive: Calcified uterine fibroids are noted. No adnexal mass. Other: Tiny umbilical fat containing hernia. No free air nor  free fluid. Musculoskeletal: Degenerative disc disease L4-5 and L5-S1 with left central to foraminal small disc herniation at L4-5. IMPRESSION: 1. Mild diffuse small bowel distention without mechanical bowel obstruction compatible with a mild small bowel enteritis. 2. Coronary arteriosclerosis. 3. Punctate nonobstructing right lower pole renal calculus. No hydroureteronephrosis. 4. Fibroid uterus. Electronically Signed   By: Tollie Eth M.D.   On: 04/24/2017 17:58   Dg Chest 2 View  Result Date: 04/24/2017 CLINICAL DATA:  Nausea and vomiting. EXAM: CHEST - 2 VIEW COMPARISON:  Chest x-ray dated March 10, 2017. FINDINGS: The heart size and mediastinal contours are within  normal limits. Normal pulmonary vascularity. No focal consolidation, pleural effusion, or pneumothorax. No acute osseous abnormality. IMPRESSION: No active cardiopulmonary disease. Electronically Signed   By: Obie Dredge M.D.   On: 04/24/2017 16:51   Ct Head Wo Contrast  Result Date: 04/24/2017 CLINICAL DATA:  Altered mental status, dehydration, vomiting. EXAM: CT HEAD WITHOUT CONTRAST TECHNIQUE: Contiguous axial images were obtained from the base of the skull through the vertex without intravenous contrast. COMPARISON:  CT head 04/26/2005. FINDINGS: Brain: No evidence for acute infarction, hemorrhage, mass lesion, hydrocephalus, or extra-axial fluid. Advanced atrophy. Hypoattenuation of white matter, likely small vessel disease. Vascular: Calcification of the cavernous internal carotid arteries consistent with cerebrovascular atherosclerotic disease. No signs of intracranial large vessel occlusion. Skull: Normal. Negative for fracture or focal lesion. Sinuses/Orbits: No acute finding. Other: None. IMPRESSION: Atrophy and small vessel disease, progressive since 2007. No acute intracranial findings. Electronically Signed   By: Elsie Stain M.D.   On: 04/24/2017 20:46        Scheduled Meds: . amLODipine  5 mg Oral Daily  . enoxaparin (LOVENOX) injection  30 mg Subcutaneous Q24H  . guaiFENesin  600 mg Oral BID  . hydrALAZINE  10 mg Oral TID  . insulin aspart  0-9 Units Subcutaneous Q4H  . insulin glargine  10 Units Subcutaneous QHS  . mouth rinse  15 mL Mouth Rinse BID  . mirtazapine  15 mg Oral QHS  . predniSONE  10 mg Oral PC supper  . [START ON 04/26/2017] predniSONE  10 mg Oral 3 x daily with food  . [START ON 04/27/2017] predniSONE  10 mg Oral 4X daily taper  . predniSONE  20 mg Oral Nightly  . [START ON 04/26/2017] predniSONE  20 mg Oral Nightly   Continuous Infusions: . sodium chloride 100 mL/hr at 04/24/17 1602     LOS: 0 days     Jacquelin Hawking, MD [facility] 04/25/2017, 2:03 PM Pager: [phone]  If 7PM-7AM, please contact night-coverage [email or web address] Password [facility] 04/25/2017, 2:03 PM

## 2017-04-26 ENCOUNTER — Other Ambulatory Visit (HOSPITAL_COMMUNITY): Payer: Medicare Other

## 2017-04-26 DIAGNOSIS — Z794 Long term (current) use of insulin: Secondary | ICD-10-CM

## 2017-04-26 DIAGNOSIS — G9340 Encephalopathy, unspecified: Secondary | ICD-10-CM | POA: Diagnosis not present

## 2017-04-26 DIAGNOSIS — I10 Essential (primary) hypertension: Secondary | ICD-10-CM | POA: Diagnosis not present

## 2017-04-26 DIAGNOSIS — G309 Alzheimer's disease, unspecified: Secondary | ICD-10-CM | POA: Diagnosis not present

## 2017-04-26 DIAGNOSIS — E1165 Type 2 diabetes mellitus with hyperglycemia: Secondary | ICD-10-CM | POA: Diagnosis not present

## 2017-04-26 LAB — GLUCOSE, CAPILLARY
GLUCOSE-CAPILLARY: 157 mg/dL — AB (ref 65–99)
GLUCOSE-CAPILLARY: 198 mg/dL — AB (ref 65–99)
GLUCOSE-CAPILLARY: 274 mg/dL — AB (ref 65–99)
Glucose-Capillary: 142 mg/dL — ABNORMAL HIGH (ref 65–99)
Glucose-Capillary: 204 mg/dL — ABNORMAL HIGH (ref 65–99)

## 2017-04-26 MED ORDER — CETIRIZINE HCL 10 MG PO TABS: 5.0000 mg | ORAL_TABLET | Freq: Every day | ORAL | Status: DC

## 2017-04-26 MED ORDER — HYDRALAZINE HCL 20 MG/ML IJ SOLN
5.0000 mg | Freq: Once | INTRAMUSCULAR | Status: AC
  Administered 2017-04-26: 5 mg via INTRAVENOUS
  Filled 2017-04-26: qty 1

## 2017-04-26 MED ORDER — INSULIN GLARGINE 100 UNIT/ML ~~LOC~~ SOLN: 10.0000 [IU] | Freq: Every day | SUBCUTANEOUS | Status: DC

## 2017-04-26 NOTE — Discharge Summary (Addendum)
Physician Discharge Summary  [name]:[id number] DOB: [birth year] DOA: 04/24/2017  PCP: Laurann Montana, MD  Admit date: 04/24/2017 Discharge date: 04/26/2017  Admitted From: ALF Disposition: SNF  Recommendations for Outpatient Follow-up:  1. Follow up with PCP in 1 week 2. Please obtain BMP/CBC in one week 3. Please follow up on the following pending results: None  Home Health: SNF  Discharge Condition: Stable CODE STATUS: Full code Diet recommendation: Heart healthy   Brief/Interim Summary:  Admission HPI written by Therisa Doyne, MD   HPI: [name] is a 77 y.o. female with medical history significant of angioedema secondary to ACE inhibitor, DM 2, dementia, HTN, depression, HLD    Presented with nausea vomiting diarrhea since this morning facility was concerned that patient was getting dehydrated and EMS was called. She herself unsure why she is here denies any complaints unable to provide detailed history secondary to known history of dementia according to EMS she had negative orthostatics.  At some point she did mention that her abdomen was hurting otherwise no chest pain shortness of breath or fevers no black stools no melena and no blood in emesis.  Recent admission from 20th - 21 April secondary to angioedema thought to be due to ACE inhibitor was discharged yesterday back to her nursing home facility she was treated with steroids and famotidine.  She was treated with IV Solu-Medrol Benadryl discharge and prednisone taper and famotidine medications were changed from losartan to amlodipine.  At baseline able to walk and feed self.    Hospital course:  Acute metabolic encephalopathy Possibly secondary to dehydration. Urine culture negative for UTI.  Enteritis Small bowel, mild. No diarrhea. Monitor clinically.  Dementia On Namenda and Aricept as an outpatient. Held secondary to GI complaints  Diabetes mellitus, insulin  dependent Controlled this morning. Hemoglobin A1C of 5.6% which is likely too well controlled, predisposing to hypoglycemia. Discontinued Lantus and glimepiride on discharge. Continue metformin and Januvia. Hemoglobin A1C q3 months.  Essential hypertension Uncontrolled overnight. Better this morning. Continued Norvasc and hydralazine  Acute kidney injury Resolved.  Dehydration Given IV fluids. Resolved.  History of angioedema Thought secondary to losartan. On prednisone taper. Continued.  Murmur Unable to obtain Transthoracic Echocardiogram prior to discharge. Recommend outpatient follow-up.  Discharge Diagnoses:  Active Problems:   Diabetes mellitus out of control (HCC)   Dementia, Alzheimer's, with behavior disturbance   Essential hypertension   Angioedema   AKI (acute kidney injury) (HCC)   Dehydration   Acute encephalopathy    Discharge Instructions  Discharge Instructions    Diet - low sodium heart healthy   Complete by:  As directed    Increase activity slowly   Complete by:  As directed      Allergies as of 04/26/2017      Reactions   Losartan Swelling   angioedema      Medication List    STOP taking these medications   AMARYL 4 MG tablet Generic drug:  glimepiride   haloperidol 2 MG/ML solution Commonly known as:  HALDOL   insulin glargine 100 UNIT/ML injection Commonly known as:  LANTUS     TAKE these medications   alendronate 70 MG tablet Commonly known as:  FOSAMAX Take 70 mg by mouth every Tuesday. Take with a full glass of water on an empty stomach.   amLODipine 5 MG tablet Commonly known as:  NORVASC Take 5 mg by mouth daily. What changed:  Another medication with the same name was  removed. Continue taking this medication, and follow the directions you see here.   calcium-vitamin D 500-200 MG-UNIT tablet Commonly known as:  OSCAL WITH D Take 1 tablet by mouth daily.   cetirizine 10 MG tablet Commonly known as:  ZYRTEC Take  0.5 tablets (5 mg total) by mouth daily. What changed:  how much to take   donepezil 5 MG tablet Commonly known as:  ARICEPT Take 1 tablet (5 mg total) by mouth at bedtime.   famotidine 20 MG tablet Commonly known as:  PEPCID Take 1 tablet (20 mg total) by mouth 2 (two) times daily for 15 days.   hydrALAZINE 10 MG tablet Commonly known as:  APRESOLINE Take 10 mg by mouth 3 (three) times daily.   linagliptin 5 MG Tabs tablet Commonly known as:  TRADJENTA Take 5 mg by mouth daily.   metFORMIN 500 MG 24 hr tablet Commonly known as:  GLUCOPHAGE-XR Take 500 mg by mouth every evening.   metoprolol succinate 25 MG 24 hr tablet Commonly known as:  TOPROL-XL Take 25 mg by mouth daily.   mirtazapine 15 MG tablet Commonly known as:  REMERON Take 15 mg by mouth at bedtime.   NAMENDA XR 28 MG Cp24 24 hr capsule Generic drug:  memantine Take 28 mg by mouth daily.   omeprazole 40 MG capsule Commonly known as:  PRILOSEC Take 1 capsule (40 mg total) by mouth daily.   predniSONE 10 MG (21) Tbpk tablet Commonly known as:  STERAPRED UNI-PAK 21 TAB Use per package instruction      Follow-up Information    Laurann Montana, MD Follow up.   Specialty:  Family Medicine Contact information: [address], [address] [place] [address] [phone]          Allergies  Allergen Reactions  . Losartan Swelling    angioedema    Consultations:  None   Procedures/Studies: Ct Abdomen Pelvis Wo Contrast  Result Date: 04/24/2017 CLINICAL DATA:  Vomiting and diarrhea x3 episodes this morning. Patient was seen for allergic reaction yesterday. Concern for dehydration. EXAM: CT ABDOMEN AND PELVIS WITHOUT CONTRAST TECHNIQUE: Multidetector CT imaging of the abdomen and pelvis was performed following the standard protocol without IV contrast. COMPARISON:  02/16/2012 pelvic ultrasound FINDINGS: Lower chest: Top normal heart size without pericardial effusion. The visualized thoracic  aorta demonstrates minimal atherosclerosis. There is coronary arteriosclerosis along the left circumflex and LAD. Hepatobiliary: Biliary sludge and calculi are identified within the gallbladder. No pericholecystic fluid or wall thickening. The unenhanced liver is unremarkable. Pancreas: No ductal dilatation, mass or inflammation given limitations of a noncontrast study. Spleen: Normal size spleen without focal mass. Adrenals/Urinary Tract: Normal bilateral adrenal glands. Punctate nonobstructing calculus in the lower pole of the right kidney. No hydroureteronephrosis. Bladder unremarkable for degree of distention. Stomach/Bowel: The stomach is distended with ingested food. There is normal small rotation. Mild fluid-filled distention of small bowel without mechanical source of bowel obstruction. Findings may reflect a mild small bowel enteritis. Normal appendix visualized. Colonic diverticulosis without acute diverticulitis. Vascular/Lymphatic: Aortoiliac atherosclerosis. No aneurysm. No lymphadenopathy. Reproductive: Calcified uterine fibroids are noted. No adnexal mass. Other: Tiny umbilical fat containing hernia. No free air nor free fluid. Musculoskeletal: Degenerative disc disease L4-5 and L5-S1 with left central to foraminal small disc herniation at L4-5. IMPRESSION: 1. Mild diffuse small bowel distention without mechanical bowel obstruction compatible with a mild small bowel enteritis. 2. Coronary arteriosclerosis. 3. Punctate nonobstructing right lower pole renal calculus. No hydroureteronephrosis. 4. Fibroid uterus. Electronically  Signed   By: Tollie Ethavid  Kwon M.D.   On: 04/24/2017 17:58   Dg Chest 2 View  Result Date: 04/24/2017 CLINICAL DATA:  Nausea and vomiting. EXAM: CHEST - 2 VIEW COMPARISON:  Chest x-ray dated March 10, 2017. FINDINGS: The heart size and mediastinal contours are within normal limits. Normal pulmonary vascularity. No focal consolidation, pleural effusion, or pneumothorax. No acute  osseous abnormality. IMPRESSION: No active cardiopulmonary disease. Electronically Signed   By: Obie DredgeWilliam T Derry M.D.   On: 04/24/2017 16:51   Ct Head Wo Contrast  Result Date: 04/24/2017 CLINICAL DATA:  Altered mental status, dehydration, vomiting. EXAM: CT HEAD WITHOUT CONTRAST TECHNIQUE: Contiguous axial images were obtained from the base of the skull through the vertex without intravenous contrast. COMPARISON:  CT head 04/26/2005. FINDINGS: Brain: No evidence for acute infarction, hemorrhage, mass lesion, hydrocephalus, or extra-axial fluid. Advanced atrophy. Hypoattenuation of white matter, likely small vessel disease. Vascular: Calcification of the cavernous internal carotid arteries consistent with cerebrovascular atherosclerotic disease. No signs of intracranial large vessel occlusion. Skull: Normal. Negative for fracture or focal lesion. Sinuses/Orbits: No acute finding. Other: None. IMPRESSION: Atrophy and small vessel disease, progressive since 2007. No acute intracranial findings. Electronically Signed   By: Elsie StainJohn T Curnes M.D.   On: 04/24/2017 20:46      Subjective: No issues today. No pain.  Discharge Exam: Vitals:   04/25/17 2359 04/26/17 0530  BP: (!) 189/96 (!) 155/92  Pulse: 75 76  Resp: 20 19  Temp: 98.7 F (37.1 C) 98.5 F (36.9 C)  SpO2: 100% 98%   Vitals:   04/25/17 1423 04/25/17 2018 04/25/17 2359 04/26/17 0530  BP: (!) 174/91 (!) 147/78 (!) 189/96 (!) 155/92  Pulse: 85 82 75 76  Resp: 17 17 20 19   Temp:  98.3 F (36.8 C) 98.7 F (37.1 C) 98.5 F (36.9 C)  TempSrc:  Oral    SpO2: 100% 97% 100% 98%  Weight:      Height:        General: Pt is alert, awake, not in acute distress Cardiovascular: RRR, S1/S2 +, 2/6 systolic murmur, no rubs, no gallops Respiratory: CTA bilaterally, no wheezing, no rhonchi Abdominal: Soft, NT, ND, bowel sounds + Extremities: no edema, no cyanosis Neuro: alert, oriented to self    The results of significant diagnostics from  this hospitalization (including imaging, microbiology, ancillary and laboratory) are listed below for reference.     Microbiology: Recent Results (from the past 240 hour(s))  Urine culture     Status: None   Collection Time: 04/24/17  3:45 PM  Result Value Ref Range Status   Specimen Description   Final    URINE, RANDOM Performed at [facility], [address] [place], [place] [address]    Special Requests   Final    URINE, RANDOM Performed at [facility], [address] [place], [place] [address]    Culture   Final    NO GROWTH Performed at [facility], [address] [place], [place] [address]    Report Status 04/25/2017 FINAL  Final  MRSA PCR Screening     Status: None   Collection Time: 04/25/17  2:24 AM  Result Value Ref Range Status   MRSA by PCR NEGATIVE NEGATIVE Final    Comment:        The GeneXpert MRSA Assay (FDA approved for NASAL specimens only), is one component of a comprehensive MRSA colonization surveillance program. It is not intended to diagnose  MRSA infection nor to guide or monitor treatment for MRSA infections. Performed at [facility], [address] [place], [place] [address]      Labs: BNP (last 3 results) No results for input(s): BNP in the last 8760 hours. Basic Metabolic Panel: Recent Labs  Lab 04/22/17 1158 04/23/17 0821 04/24/17 1545 04/25/17 0051  NA 141 139 145 143  K 3.7 3.9 4.7 4.2  CL 104 104 107 111  CO2 27 24 25 22   GLUCOSE 112* 172* 177* 105*  BUN 12 13 30* 24*  CREATININE 0.97 0.84 1.04* 0.97  CALCIUM 9.2 9.1 9.7 8.6*  MG  --   --   --  1.7  PHOS  --   --   --  2.2*   Liver Function Tests: Recent Labs  Lab 04/24/17 1545 04/25/17 0051  AST 31 30  ALT 31 26  ALKPHOS 86 66  BILITOT 0.8 0.7  PROT 8.4* 6.7  ALBUMIN 4.0 3.2*   Recent Labs  Lab 04/24/17 1545  LIPASE 40   No results for input(s): AMMONIA in the last 168 hours. CBC: Recent  Labs  Lab 04/22/17 1158 04/23/17 0821 04/24/17 1545 04/25/17 0051  WBC 5.8 13.0* 9.5 7.3  NEUTROABS 3.3  --  7.6  --   HGB 12.0 12.7 15.1* 13.3  HCT 37.6 38.3 45.0 40.0  MCV 94.7 93.9 96.4 95.7  PLT 197 218 247 194   Cardiac Enzymes: Recent Labs  Lab 04/24/17 1545 04/25/17 0051 04/25/17 0617 04/25/17 1325  TROPONINI <0.03 <0.03 <0.03 <0.03   BNP: Invalid input(s): POCBNP CBG: Recent Labs  Lab 04/25/17 2006 04/25/17 2355 04/26/17 0527 04/26/17 0726 04/26/17 1107  GLUCAP 245* 166* 157* 142* 204*   D-Dimer No results for input(s): DDIMER in the last 72 hours. Hgb A1c Recent Labs    04/25/17 0051  HGBA1C 5.6   Lipid Profile No results for input(s): CHOL, HDL, LDLCALC, TRIG, CHOLHDL, LDLDIRECT in the last 72 hours. Thyroid function studies Recent Labs    04/25/17 0051  TSH 2.247   Anemia work up No results for input(s): VITAMINB12, FOLATE, FERRITIN, TIBC, IRON, RETICCTPCT in the last 72 hours. Urinalysis    Component Value Date/Time   COLORURINE YELLOW 04/24/2017 1545   APPEARANCEUR CLEAR 04/24/2017 1545   LABSPEC 1.020 04/24/2017 1545   PHURINE 5.0 04/24/2017 1545   GLUCOSEU NEGATIVE 04/24/2017 1545   HGBUR NEGATIVE 04/24/2017 1545   HGBUR negative 01/28/2009 1523   BILIRUBINUR NEGATIVE 04/24/2017 1545   BILIRUBINUR NEG 04/04/2011 0903   KETONESUR NEGATIVE 04/24/2017 1545   PROTEINUR NEGATIVE 04/24/2017 1545   UROBILINOGEN 0.2 04/04/2011 0903   UROBILINOGEN 0.2 01/28/2009 1523   NITRITE NEGATIVE 04/24/2017 1545   LEUKOCYTESUR NEGATIVE 04/24/2017 1545   Sepsis Labs Invalid input(s): PROCALCITONIN,  WBC,  LACTICIDVEN Microbiology Recent Results (from the past 240 hour(s))  Urine culture     Status: None   Collection Time: 04/24/17  3:45 PM  Result Value Ref Range Status   Specimen Description   Final    URINE, RANDOM Performed at [facility], [address], [place], [place] [address]    Special Requests   Final     URINE, RANDOM Performed at [facility], [address], [place], [place] [address]    Culture   Final    NO GROWTH Performed at [facility], [address], [place], [place] [address]    Report Status 04/25/2017 FINAL  Final  MRSA PCR  Screening     Status: None   Collection Time: 04/25/17  2:24 AM  Result Value Ref Range Status   MRSA by PCR NEGATIVE NEGATIVE Final    Comment:        The GeneXpert MRSA Assay (FDA approved for NASAL specimens only), is one component of a comprehensive MRSA colonization surveillance program. It is not intended to diagnose MRSA infection nor to guide or monitor treatment for MRSA infections. Performed at [facility], [address], [place], [place] [address]      SIGNED:   Jacquelin Hawking, MD [facility] 04/26/2017, 11:59 AM Pager [phone]  If 7PM-7AM, please contact night-coverage [email or web address] Password [email or web address]

## 2017-04-26 NOTE — Evaluation (Signed)
Physical Therapy Evaluation Patient Details Name: [name] MRN: [id number] DOB: 11-19-1940 Today's Date: 04/26/2017   History of Present Illness  77yo female presenting with nausea and vomiting and diarrhea, EMS called by her facility due to concerns of dehydration. Note recent admission due to angioedema with corresponding DC on 04/23/17. Diagnosed with acute encephalopathy, enteritis. PMH anxiety, dementia, DM, hx hepatitis, HTN   Clinical Impression   Patient received in bed, very pleasant and willing to participate in PT session but A&Ox1 this morning. She requires MaxAx1 for functional bed mobility, able to maintain midline sitting with Min guard today, however required MaxA for sit to stand as well as for lateral scooting along EOB this morning. Performed rolling to adjust linens/bed clothes under her with ModA and significant difficulty noted with following cues. Strongly recommend +2 assistance for further progression of mobility to maintain safety with mobility. She was left in bed with all needs met, alarm activated, and CNA present and attending. She will continue to benefit from skilled PT services in the acute setting as well as ST-SNF to further address functional deficits moving forward.     Follow Up Recommendations SNF    Equipment Recommendations  Other (comment)(defer to next venue )    Recommendations for Other Services       Precautions / Restrictions Precautions Precautions: Fall Restrictions Weight Bearing Restrictions: No      Mobility  Bed Mobility Overal bed mobility: Needs Assistance Bed Mobility: Rolling;Supine to Sit;Sit to Supine Rolling: Mod assist   Supine to sit: Max assist Sit to supine: Max assist   General bed mobility comments: poor initiation and poor cue following, difficulty with sequencing   Transfers Overall transfer level: Needs assistance Equipment used: None Transfers: Sit to/from Stand;Lateral/Scoot Transfers Sit to Stand:  Max assist        Lateral/Scoot Transfers: Max assist General transfer comment: able to stand with +1 assist, therapist in front of her and boost given by chuck pad under her; able to fully stand but very unsteady and only able to maintain for less than 10 seconds. MaxA for lateral scoot transfer along the edge of the bed.   Ambulation/Gait             General Gait Details: unable   Stairs            Wheelchair Mobility    Modified Rankin (Stroke Patients Only)       Balance Overall balance assessment: Needs assistance Sitting-balance support: Bilateral upper extremity supported;Feet supported Sitting balance-Leahy Scale: Fair Sitting balance - Comments: able to maintain midline with Min guard today    Standing balance support: During functional activity;No upper extremity supported Standing balance-Leahy Scale: Poor Standing balance comment: reliant on external assistance                              Pertinent Vitals/Pain Pain Assessment: Faces Pain Score: 0-No pain Faces Pain Scale: No hurt Pain Intervention(s): Monitored during session    Home Living Family/patient expects to be discharged to:: Assisted living                 Additional Comments: from [facility]. Per notes from admission last month, pt ambulated and fed herself. She had help for bathing/dressing. Available PLOF taken from chart review, patient very poor historian     Prior Function Level of Independence: Needs assistance   Gait / Transfers Assistance Needed: person assist for  mobility per chart review, pt from ALF, poor historian           Hand Dominance        Extremity/Trunk Assessment   Upper Extremity Assessment Upper Extremity Assessment: Defer to OT evaluation    Lower Extremity Assessment Lower Extremity Assessment: Generalized weakness    Cervical / Trunk Assessment Cervical / Trunk Assessment: Kyphotic  Communication   Communication: No  difficulties  Cognition Arousal/Alertness: Awake/alert Behavior During Therapy: WFL for tasks assessed/performed Overall Cognitive Status: No family/caregiver present to determine baseline cognitive functioning                                 General Comments: h/o dementia.  poor initiation with multimodal cues.  Responds to name      General Comments General comments (skin integrity, edema, etc.): A&Ox1, to self only     Exercises     Assessment/Plan    PT Assessment Patient needs continued PT services  PT Problem List Decreased strength;Decreased mobility;Decreased safety awareness;Decreased coordination;Decreased balance       PT Treatment Interventions DME instruction;Therapeutic activities;Gait training;Therapeutic exercise;Patient/family education;Stair training;Balance training;Functional mobility training;Neuromuscular re-education    PT Goals (Current goals can be found in the Care Plan section)  Acute Rehab PT Goals PT Goal Formulation: Patient unable to participate in goal setting    Frequency Min 2X/week   Barriers to discharge        Co-evaluation               AM-PAC PT "6 Clicks" Daily Activity  Outcome Measure Difficulty turning over in bed (including adjusting bedclothes, sheets and blankets)?: Unable Difficulty moving from lying on back to sitting on the side of the bed? : Unable Difficulty sitting down on and standing up from a chair with arms (e.g., wheelchair, bedside commode, etc,.)?: Unable Help needed moving to and from a bed to chair (including a wheelchair)?: A Lot Help needed walking in hospital room?: Total Help needed climbing 3-5 steps with a railing? : Total 6 Click Score: 7    End of Session   Activity Tolerance: Patient tolerated treatment well Patient left: in bed;with bed alarm set;with call bell/phone within reach;with nursing/sitter in room   PT Visit Diagnosis: Unsteadiness on feet (R26.81);Muscle weakness  (generalized) (M62.81);Other abnormalities of gait and mobility (R26.89);Difficulty in walking, not elsewhere classified (R26.2)    Time: 9702-6378 PT Time Calculation (min) (ACUTE ONLY): 12 min   Charges:   PT Evaluation $PT Eval Low Complexity: 1 Low     PT G Codes:        Deniece Ree PT, DPT, CBIS  Supplemental Physical Therapist [place]   Pager [phone]

## 2017-04-26 NOTE — Care Management Obs Status (Signed)
MEDICARE OBSERVATION STATUS NOTIFICATION   Patient Details  Name: [name] MRN: [id number] Date of Birth: [birth year]   Medicare Observation Status Notification Given:  Yes    Golda AcreDavis, Gabrianna Fassnacht Lynn, RN 04/26/2017, 10:11 AM

## 2017-04-26 NOTE — [place] FL2 (Signed)
[place] MEDICAID FL2 LEVEL OF CARE SCREENING TOOL     IDENTIFICATION  Patient Name: [name] Birthdate: [birth year] Sex: female Admission Date (Current Location): 04/24/2017  [facility] and [place] Number:  Producer, television/film/video and Address:  [facility],  [address] [place], [place] [address]      Provider Number: [id number]  Attending Physician Name and Address:  Narda Bonds, MD  Relative Name and Phone Number:       Current Level of Care: Hospital Recommended Level of Care: Skilled Nursing Facility Prior Approval Number:    Date Approved/Denied:   PASRR Number:   [id number]  Discharge Plan: SNF    Current Diagnoses: Patient Active Problem List   Diagnosis Date Noted  . AKI (acute kidney injury) (HCC) 04/24/2017  . Dehydration 04/24/2017  . Acute encephalopathy 04/24/2017  . Angioedema 04/22/2017  . HCAP (healthcare-associated pneumonia) 03/10/2017  . Anemia 02/14/2012  . GI Bleed 02/14/2012  . Blepharitis of left upper eyelid 04/25/2011  . Cough 12/30/2010  . Right ear pain 11/20/2010  . Urinary frequency 11/20/2010  . Pain in gums 06/07/2010  . Systolic murmur 03/18/2010  . Right thigh pain 03/18/2010  . Urge incontinence 03/18/2010  . Dementia, Alzheimer's, with behavior disturbance 09/30/2009  . OSTEOPOROSIS 09/21/2009  . CONSTIPATION 05/15/2009  . Diabetes mellitus out of control (HCC) 01/12/2009  . HYPERLIPIDEMIA 01/12/2009  . ANXIETY 01/12/2009  . DEPRESSION 01/12/2009  . Essential hypertension 01/12/2009  . THYROIDECTOMY, HX OF 01/12/2009    Orientation RESPIRATION BLADDER Height & Weight     Self  Normal Continent Weight: 128 lb 1.4 oz (58.1 kg) Height:  5\' 3"  (160 cm)  BEHAVIORAL SYMPTOMS/MOOD NEUROLOGICAL BOWEL NUTRITION STATUS      Continent Diet(See dc summary)  AMBULATORY STATUS COMMUNICATION OF NEEDS Skin   Extensive Assist Verbally Normal                       Personal Care Assistance Level of Assistance   Bathing, Feeding, Dressing Bathing Assistance: Limited assistance Feeding assistance: Independent Dressing Assistance: Limited assistance     Functional Limitations Info  Sight, Hearing, Speech Sight Info: Impaired Hearing Info: Impaired Speech Info: Adequate    SPECIAL CARE FACTORS FREQUENCY  PT (By licensed PT), OT (By licensed OT)     PT Frequency: 5x/week OT Frequency: 5x/week            Contractures Contractures Info: Not present    Additional Factors Info  Code Status, Allergies Code Status Info: Full Allergies Info: Losartan           Current Medications (04/26/2017):  This is the current hospital active medication list Current Facility-Administered Medications  Medication Dose Route Frequency Provider Last Rate Last Dose  . 0.9 %  sodium chloride infusion   Intravenous Continuous Therisa Doyne, MD 100 mL/hr at 04/26/17 0816    . acetaminophen (TYLENOL) tablet 650 mg  650 mg Oral Q6H PRN Therisa Doyne, MD   650 mg at 04/26/17 0426   Or  . acetaminophen (TYLENOL) suppository 650 mg  650 mg Rectal Q6H PRN Doutova, Anastassia, MD      . albuterol (PROVENTIL) (2.5 MG/3ML) 0.083% nebulizer solution 2.5 mg  2.5 mg Nebulization Q2H PRN Doutova, Anastassia, MD      . amLODipine (NORVASC) tablet 5 mg  5 mg Oral Daily Doutova, Anastassia, MD   5 mg at 04/26/17 0918  . enoxaparin (LOVENOX) injection 30 mg  30 mg Subcutaneous Q24H  Therisa Doyneoutova, Anastassia, MD   30 mg at 04/26/17 0918  . guaiFENesin (MUCINEX) 12 hr tablet 600 mg  600 mg Oral BID Therisa Doyneoutova, Anastassia, MD   600 mg at 04/26/17 0918  . hydrALAZINE (APRESOLINE) tablet 10 mg  10 mg Oral TID Therisa Doyneoutova, Anastassia, MD   10 mg at 04/26/17 [id number]  . insulin aspart (novoLOG) injection 0-9 Units  0-9 Units Subcutaneous Q4H Therisa Doyneoutova, Anastassia, MD   3 Units at 04/26/17 1119  . insulin glargine (LANTUS) injection 10 Units  10 Units Subcutaneous QHS Therisa Doyneoutova, Anastassia, MD   10 Units at 04/25/17 2217  . MEDLINE mouth  rinse  15 mL Mouth Rinse BID Therisa Doyneoutova, Anastassia, MD   15 mL at 04/26/17 0918  . mirtazapine (REMERON) tablet 15 mg  15 mg Oral QHS Therisa Doyneoutova, Anastassia, MD   15 mg at 04/25/17 2216  . ondansetron (ZOFRAN) tablet 4 mg  4 mg Oral Q6H PRN Doutova, Anastassia, MD       Or  . ondansetron (ZOFRAN) injection 4 mg  4 mg Intravenous Q6H PRN Therisa Doyneoutova, Anastassia, MD   4 mg at 04/26/17 0426  . predniSONE (STERAPRED UNI-PAK 21 TAB) tablet 10 mg  10 mg Oral 3 x daily with food Therisa Doyneoutova, Anastassia, MD   10 mg at 04/26/17 1231  . [START ON 04/27/2017] predniSONE (STERAPRED UNI-PAK 21 TAB) tablet 10 mg  10 mg Oral 4X daily taper Doutova, Anastassia, MD      . predniSONE (STERAPRED UNI-PAK 21 TAB) tablet 20 mg  20 mg Oral Nightly Therisa Doyneoutova, Anastassia, MD         Discharge Medications: Please see discharge summary for a list of discharge medications.  Relevant Imaging Results:  Relevant Lab Results:   Additional Information SSN:[id number]  Coralyn HellingBernette Jasalyn Frysinger, LCSW

## 2017-04-26 NOTE — Discharge Instructions (Signed)
Dehydration, Adult Dehydration is a condition in which there is not enough fluid or water in the body. This happens when you lose more fluids than you take in. Important organs, such as the kidneys, brain, and heart, cannot function without a proper amount of fluids. Any loss of fluids from the body can lead to dehydration. Dehydration can range from mild to severe. This condition should be treated right away to prevent it from becoming severe. What are the causes? This condition may be caused by:  Vomiting.  Diarrhea.  Excessive sweating, such as from heat exposure or exercise.  Not drinking enough fluid, especially: ? When ill. ? While doing activity that requires a lot of energy.  Excessive urination.  Fever.  Infection.  Certain medicines, such as medicines that cause the body to lose excess fluid (diuretics).  Inability to access safe drinking water.  Reduced physical ability to get adequate water and food.  What increases the risk? This condition is more likely to develop in people:  Who have a poorly controlled long-term (chronic) illness, such as diabetes, heart disease, or kidney disease.  Who are age 65 or older.  Who are disabled.  Who live in a place with high altitude.  Who play endurance sports.  What are the signs or symptoms? Symptoms of mild dehydration may include:  Thirst.  Dry lips.  Slightly dry mouth.  Dry, warm skin.  Dizziness. Symptoms of moderate dehydration may include:  Very dry mouth.  Muscle cramps.  Dark urine. Urine may be the color of tea.  Decreased urine production.  Decreased tear production.  Heartbeat that is irregular or faster than normal (palpitations).  Headache.  Light-headedness, especially when you stand up from a sitting position.  Fainting (syncope). Symptoms of severe dehydration may include:  Changes in skin, such as: ? Cold and clammy skin. ? Blotchy (mottled) or pale skin. ? Skin that does  not quickly return to normal after being lightly pinched and released (poor skin turgor).  Changes in body fluids, such as: ? Extreme thirst. ? No tear production. ? Inability to sweat when body temperature is high, such as in hot weather. ? Very little urine production.  Changes in vital signs, such as: ? Weak pulse. ? Pulse that is more than 100 beats a minute when sitting still. ? Rapid breathing. ? Low blood pressure.  Other changes, such as: ? Sunken eyes. ? Cold hands and feet. ? Confusion. ? Lack of energy (lethargy). ? Difficulty waking up from sleep. ? Short-term weight loss. ? Unconsciousness. How is this diagnosed? This condition is diagnosed based on your symptoms and a physical exam. Blood and urine tests may be done to help confirm the diagnosis. How is this treated? Treatment for this condition depends on the severity. Mild or moderate dehydration can often be treated at home. Treatment should be started right away. Do not wait until dehydration becomes severe. Severe dehydration is an emergency and it needs to be treated in a hospital. Treatment for mild dehydration may include:  Drinking more fluids.  Replacing salts and minerals in your blood (electrolytes) that you may have lost. Treatment for moderate dehydration may include:  Drinking an oral rehydration solution (ORS). This is a drink that helps you replace fluids and electrolytes (rehydrate). It can be found at pharmacies and retail stores. Treatment for severe dehydration may include:  Receiving fluids through an IV tube.  Receiving an electrolyte solution through a feeding tube that is passed through your nose   and into your stomach (nasogastric tube, or NG tube).  Correcting any abnormalities in electrolytes.  Treating the underlying cause of dehydration. Follow these instructions at home:  If directed by your health care provider, drink an ORS: ? Make an ORS by following instructions on the  package. ? Start by drinking small amounts, about  cup (120 mL) every 5-10 minutes. ? Slowly increase how much you drink until you have taken the amount recommended by your health care provider.  Drink enough clear fluid to keep your urine clear or pale yellow. If you were told to drink an ORS, finish the ORS first, then start slowly drinking other clear fluids. Drink fluids such as: ? Water. Do not drink only water. Doing that can lead to having too little salt (sodium) in the body (hyponatremia). ? Ice chips. ? Fruit juice that you have added water to (diluted fruit juice). ? Low-calorie sports drinks.  Avoid: ? Alcohol. ? Drinks that contain a lot of sugar. These include high-calorie sports drinks, fruit juice that is not diluted, and soda. ? Caffeine. ? Foods that are greasy or contain a lot of fat or sugar.  Take over-the-counter and prescription medicines only as told by your health care provider.  Do not take sodium tablets. This can lead to having too much sodium in the body (hypernatremia).  Eat foods that contain a healthy balance of electrolytes, such as bananas, oranges, potatoes, tomatoes, and spinach.  Keep all follow-up visits as told by your health care provider. This is important. Contact a health care provider if:  You have abdominal pain that: ? Gets worse. ? Stays in one area (localizes).  You have a rash.  You have a stiff neck.  You are more irritable than usual.  You are sleepier or more difficult to wake up than usual.  You feel weak or dizzy.  You feel very thirsty.  You have urinated only a small amount of very dark urine over 6-8 hours. Get help right away if:  You have symptoms of severe dehydration.  You cannot drink fluids without vomiting.  Your symptoms get worse with treatment.  You have a fever.  You have a severe headache.  You have vomiting or diarrhea that: ? Gets worse. ? Does not go away.  You have blood or green matter  (bile) in your vomit.  You have blood in your stool. This may cause stool to look black and tarry.  You have not urinated in 6-8 hours.  You faint.  Your heart rate while sitting still is over 100 beats a minute.  You have trouble breathing. This information is not intended to replace advice given to you by your health care provider. Make sure you discuss any questions you have with your health care provider. Document Released: 12/20/2004 Document Revised: 07/17/2015 Document Reviewed: 02/13/2015 Elsevier Interactive Patient Education  2018 [employer]  

## 2017-04-26 NOTE — Clinical Social Work Note (Signed)
Clinical Social Work Assessment  Patient Details  Name: [name] MRN: [id number] Date of Birth: October 08, 1940  Date of referral:  04/26/17               Reason for consult:  Facility Placement                Permission sought to share information with:  Case Manager, Family Supports Permission granted to share information::  No(Patient not oriented. )  Name::     [name]  Agency::  SNF  Relationship::  Daughter  Contact Information:     Housing/Transportation Living arrangements for the past 2 months:  Assisted Living Facility Source of Information:  Adult Children Patient Interpreter Needed:  None Criminal Activity/Legal Involvement Pertinent to Current Situation/Hospitalization:  No - Comment as needed Significant Relationships:  Adult Children, Community Support Lives with:  Facility Resident Do you feel safe going back to the place where you live?  Yes Need for family participation in patient care:  Yes (Comment)  Care giving concerns:  No care giving concerns at the time of assessments.    Social Worker assessment / plan:  LCSW following for SNF placement.   Patient admitted for dehydration.   LCSW spoke with patient's daughter [name] by phone. Patient is not oriented.   [name] reports that patient has been a resident at [facility] for almost a year. At baseline daughter reports patient ambulates independently, but recently having trouble getting around. According to daughter patient needs assistance with bathing and dressing.   Patient's daughter is agreeable to SNF at dc.  PLAN: SNF at dc.   Employment status:  Retired Health and safety inspectornsurance information:    PT Recommendations:  Skilled Teacher, early years/preursing Facility Information / Referral to community resources:     Patient/Family's Response to care:  Daughter is thankful for [employer] services.   Patient/Family's Understanding of and Emotional Response to Diagnosis, Current Treatment, and Prognosis:  Patient's daughter expressed  concerns about patients level of functioning at ALF. [name] stated that she thought her mom would go to SNF for long term care opposed to short term rehab. LCSW explained long term care placement process. Daughter expressed understanding and stated she will make a decision based on patients progress at rehab.   Emotional Assessment Appearance:  Appears stated age Attitude/Demeanor/Rapport:  Unable to Assess Affect (typically observed):    Orientation:  Oriented to Self Alcohol / Substance use:  Not Applicable Psych involvement (Current and /or in the community):  No (Comment)  Discharge Needs  Concerns to be addressed:  No discharge needs identified Readmission within the last 30 days:  Yes Current discharge risk:  None Barriers to Discharge:  No Barriers Identified   Coralyn HellingBernette Pranit Owensby, LCSW 04/26/2017, 4:04 PM

## 2017-04-26 NOTE — Progress Notes (Signed)
Patient has discharged to SNF on 04/26/17. Discharge instruction including medication was given to patient. SW is notified.

## 2017-04-27 DIAGNOSIS — N179 Acute kidney failure, unspecified: Secondary | ICD-10-CM | POA: Diagnosis not present

## 2017-04-27 DIAGNOSIS — E860 Dehydration: Secondary | ICD-10-CM | POA: Diagnosis not present

## 2017-04-27 DIAGNOSIS — G9340 Encephalopathy, unspecified: Secondary | ICD-10-CM | POA: Diagnosis not present

## 2017-04-27 LAB — GLUCOSE, CAPILLARY
GLUCOSE-CAPILLARY: 139 mg/dL — AB (ref 65–99)
Glucose-Capillary: 143 mg/dL — ABNORMAL HIGH (ref 65–99)
Glucose-Capillary: 185 mg/dL — ABNORMAL HIGH (ref 65–99)
Glucose-Capillary: 241 mg/dL — ABNORMAL HIGH (ref 65–99)

## 2017-04-27 NOTE — Progress Notes (Signed)
[facility]                                                                              Patient Demographics  [name], is a 77 y.o. female, DOB - 16-Mar-1940, ZOX:[id number]  Admit date - 04/24/2017   Admitting Physician Therisa DoyneAnastassia Doutova, MD  Outpatient Primary MD for the patient is Laurann MontanaWhite, Cynthia, MD  Outpatient specialists:   LOS - 0  days   Medical records reviewed and are as summarized below:    Chief Complaint  Patient presents with  . Dehydration       Brief summary   Michaela MollyMary H Woodardis a 77 y.o.femalewith medical history significant of angioedema secondary to ACE inhibitor, DM 2, dementia, HTN, depression, HLD Presented withnausea vomiting diarrhea since this morning facility was concerned that patient was getting dehydrated and EMS was called. She herself unsure why she is here denies any complaints unable to provide detailed history secondary to known history of dementia according to EMS she had negative orthostatics. At some point she did mention that her abdomen was hurting otherwise no chest pain shortness of breath or fevers no black stools no melena and no blood in emesis. Recent admission from 20th-21 April secondary to angioedema thought to be due to ACE inhibitor was discharged yesterday back to her nursing home facility she was treated with steroids and famotidine. She was treated with IV Solu-Medrol Benadryl discharge and prednisone taper and famotidine medications were changed from losartan to amlodipine. At baseline able to walk and feed self     Assessment & Plan    Principal Problem:   Acute metabolic encephalopathy -Probably secondary to dehydration, currently back to baseline, alert and oriented    Diabetes mellitus out of control (HCC) CBGs controlled, hemoglobin A1c 5.6-  Lantus and glipizide discontinued on discharge due to potential of hypoglycemia, continue metformin and Januvia     Dementia, Alzheimer's, with  behavior disturbance -Currently appears to be at baseline, on Namenda and Aricept as an outpatient  Enteritis -Mild small bowel, no diarrhea, tolerating diet  Essential hypertension -Currently stable  History of angioedema Thought secondary to losartan, prednisone taper  Dehydration with acute kidney injury Resolved-, patient received IV fluids Creatinine 1.04 at the time of admission, improved to 0.97  Code Status: Full DVT Prophylaxis:  Lovenox  Family Communication:   Disposition Plan: DC summary done on 4/24, no changes, stable for discharge today.  Time Spent in minutes   25 minutes  Procedures:  None   Consultants:   None   Antimicrobials:      Medications  Scheduled Meds: . amLODipine  5 mg Oral Daily  . enoxaparin (LOVENOX) injection  30 mg Subcutaneous Q24H  . guaiFENesin  600 mg Oral BID  . hydrALAZINE  10 mg Oral TID  . insulin aspart  0-9 Units Subcutaneous Q4H  . insulin glargine  10 Units Subcutaneous QHS  . mouth rinse  15 mL Mouth Rinse BID  . mirtazapine  15 mg Oral QHS  . predniSONE  10 mg Oral 4X daily taper   Continuous Infusions: . sodium chloride 100 mL/hr at 04/26/17 1624  PRN Meds:.acetaminophen **OR** acetaminophen, albuterol, ondansetron **OR** ondansetron (ZOFRAN) IV   Antibiotics   Anti-infectives (From admission, onward)   None        Subjective:   [name] was seen and examined today.  Patient denies dizziness, chest pain, shortness of breath, abdominal pain, N/V/D/C, new weakness, numbess, tingling. No acute events overnight.    Objective:   Vitals:   04/26/17 1346 04/26/17 2034 04/27/17 0419 04/27/17 0931  BP: (!) 146/71 (!) 162/77 (!) 159/85 (!) 144/74  Pulse: 70 70 60 67  Resp: 18 17 18    Temp: (!) 97.4 F (36.3 C) 98.5 F (36.9 C) 97.9 F (36.6 C)   TempSrc: Axillary     SpO2:  100% 99%   Weight:      Height:        Intake/Output Summary (Last 24 hours) at 04/27/2017 1157 Last data filed at  04/27/2017 0901 Gross per 24 hour  Intake 2473.33 ml  Output 2350 ml  Net 123.33 ml     Wt Readings from Last 3 Encounters:  04/25/17 58.1 kg (128 lb 1.4 oz)  03/10/17 59.3 kg (130 lb 11.7 oz)  02/28/14 58.5 kg (129 lb)     Exam  General: Alert and oriented x self, pleasant, NAD  Eyes:   HEENT:  Atraumatic, normocephalic,  Cardiovascular: S1 S2 auscultated, 2/6 systolic murmur Regular rate and rhythm.  Respiratory: Clear to auscultation bilaterally, no wheezing, rales or rhonchi  Gastrointestinal: Soft, nontender, nondistended, + bowel sounds  Ext: no pedal edema bilaterally  Neuro: no new deficits  Musculoskeletal: No digital cyanosis, clubbing  Skin: No rashes  Psych: alert and oriented to self, appears close to baseline   Data Reviewed:  I have personally reviewed following labs and imaging studies  Micro Results Recent Results (from the past 240 hour(s))  Urine culture     Status: None   Collection Time: 04/24/17  3:45 PM  Result Value Ref Range Status   Specimen Description   Final    URINE, RANDOM Performed at [facility], [address], [place], [place] [address]    Special Requests   Final    URINE, RANDOM Performed at [facility], [address], [place], [place] [address]    Culture   Final    NO GROWTH Performed at [facility], [address], [place], [place] [address]    Report Status 04/25/2017 FINAL  Final  MRSA PCR Screening     Status: None   Collection Time: 04/25/17  2:24 AM  Result Value Ref Range Status   MRSA by PCR NEGATIVE NEGATIVE Final    Comment:        The GeneXpert MRSA Assay (FDA approved for NASAL specimens only), is one component of a comprehensive MRSA colonization surveillance program. It is not intended to diagnose MRSA infection nor to guide or monitor treatment for MRSA infections. Performed at [facility], [address] [place],  [place] [address]     Radiology Reports Ct Abdomen Pelvis Wo Contrast  Result Date: 04/24/2017 CLINICAL DATA:  Vomiting and diarrhea x3 episodes this morning. Patient was seen for allergic reaction yesterday. Concern for dehydration. EXAM: CT ABDOMEN AND PELVIS WITHOUT CONTRAST TECHNIQUE: Multidetector CT imaging of the abdomen and pelvis was performed following the standard protocol without IV contrast. COMPARISON:  02/16/2012 pelvic ultrasound FINDINGS: Lower chest: Top normal heart size without pericardial effusion. The visualized thoracic aorta demonstrates minimal atherosclerosis. There is coronary  arteriosclerosis along the left circumflex and LAD. Hepatobiliary: Biliary sludge and calculi are identified within the gallbladder. No pericholecystic fluid or wall thickening. The unenhanced liver is unremarkable. Pancreas: No ductal dilatation, mass or inflammation given limitations of a noncontrast study. Spleen: Normal size spleen without focal mass. Adrenals/Urinary Tract: Normal bilateral adrenal glands. Punctate nonobstructing calculus in the lower pole of the right kidney. No hydroureteronephrosis. Bladder unremarkable for degree of distention. Stomach/Bowel: The stomach is distended with ingested food. There is normal small rotation. Mild fluid-filled distention of small bowel without mechanical source of bowel obstruction. Findings may reflect a mild small bowel enteritis. Normal appendix visualized. Colonic diverticulosis without acute diverticulitis. Vascular/Lymphatic: Aortoiliac atherosclerosis. No aneurysm. No lymphadenopathy. Reproductive: Calcified uterine fibroids are noted. No adnexal mass. Other: Tiny umbilical fat containing hernia. No free air nor free fluid. Musculoskeletal: Degenerative disc disease L4-5 and L5-S1 with left central to foraminal small disc herniation at L4-5. IMPRESSION: 1. Mild diffuse small bowel distention without mechanical bowel obstruction compatible with a mild small  bowel enteritis. 2. Coronary arteriosclerosis. 3. Punctate nonobstructing right lower pole renal calculus. No hydroureteronephrosis. 4. Fibroid uterus. Electronically Signed   By: Tollie Eth M.D.   On: 04/24/2017 17:58   Dg Chest 2 View  Result Date: 04/24/2017 CLINICAL DATA:  Nausea and vomiting. EXAM: CHEST - 2 VIEW COMPARISON:  Chest x-ray dated March 10, 2017. FINDINGS: The heart size and mediastinal contours are within normal limits. Normal pulmonary vascularity. No focal consolidation, pleural effusion, or pneumothorax. No acute osseous abnormality. IMPRESSION: No active cardiopulmonary disease. Electronically Signed   By: Obie Dredge M.D.   On: 04/24/2017 16:51   Ct Head Wo Contrast  Result Date: 04/24/2017 CLINICAL DATA:  Altered mental status, dehydration, vomiting. EXAM: CT HEAD WITHOUT CONTRAST TECHNIQUE: Contiguous axial images were obtained from the base of the skull through the vertex without intravenous contrast. COMPARISON:  CT head 04/26/2005. FINDINGS: Brain: No evidence for acute infarction, hemorrhage, mass lesion, hydrocephalus, or extra-axial fluid. Advanced atrophy. Hypoattenuation of white matter, likely small vessel disease. Vascular: Calcification of the cavernous internal carotid arteries consistent with cerebrovascular atherosclerotic disease. No signs of intracranial large vessel occlusion. Skull: Normal. Negative for fracture or focal lesion. Sinuses/Orbits: No acute finding. Other: None. IMPRESSION: Atrophy and small vessel disease, progressive since 2007. No acute intracranial findings. Electronically Signed   By: Elsie Stain M.D.   On: 04/24/2017 20:46    Lab Data:  CBC: Recent Labs  Lab 04/22/17 1158 04/23/17 0821 04/24/17 1545 04/25/17 0051  WBC 5.8 13.0* 9.5 7.3  NEUTROABS 3.3  --  7.6  --   HGB 12.0 12.7 15.1* 13.3  HCT 37.6 38.3 45.0 40.0  MCV 94.7 93.9 96.4 95.7  PLT 197 218 247 194   Basic Metabolic Panel: Recent Labs  Lab 04/22/17 1158  04/23/17 0821 04/24/17 1545 04/25/17 0051  NA 141 139 145 143  K 3.7 3.9 4.7 4.2  CL 104 104 107 111  CO2 27 24 25 22   GLUCOSE 112* 172* 177* 105*  BUN 12 13 30* 24*  CREATININE 0.97 0.84 1.04* 0.97  CALCIUM 9.2 9.1 9.7 8.6*  MG  --   --   --  1.7  PHOS  --   --   --  2.2*   GFR: Estimated Creatinine Clearance: 39.5 mL/min (by C-G formula based on SCr of 0.97 mg/dL). Liver Function Tests: Recent Labs  Lab 04/24/17 1545 04/25/17 0051  AST 31 30  ALT 31 26  ALKPHOS 86  66  BILITOT 0.8 0.7  PROT 8.4* 6.7  ALBUMIN 4.0 3.2*   Recent Labs  Lab 04/24/17 1545  LIPASE 40   No results for input(s): AMMONIA in the last 168 hours. Coagulation Profile: No results for input(s): INR, PROTIME in the last 168 hours. Cardiac Enzymes: Recent Labs  Lab 04/24/17 1545 04/25/17 0051 04/25/17 0617 04/25/17 1325  TROPONINI <0.03 <0.03 <0.03 <0.03   BNP (last 3 results) No results for input(s): PROBNP in the last 8760 hours. HbA1C: Recent Labs    04/25/17 0051  HGBA1C 5.6   CBG: Recent Labs  Lab 04/26/17 2015 04/27/17 0002 04/27/17 0417 04/27/17 0714 04/27/17 1110  GLUCAP 274* 185* 143* 139* 241*   Lipid Profile: No results for input(s): CHOL, HDL, LDLCALC, TRIG, CHOLHDL, LDLDIRECT in the last 72 hours. Thyroid Function Tests: Recent Labs    04/25/17 0051  TSH 2.247   Anemia Panel: No results for input(s): VITAMINB12, FOLATE, FERRITIN, TIBC, IRON, RETICCTPCT in the last 72 hours. Urine analysis:    Component Value Date/Time   COLORURINE YELLOW 04/24/2017 1545   APPEARANCEUR CLEAR 04/24/2017 1545   LABSPEC 1.020 04/24/2017 1545   PHURINE 5.0 04/24/2017 1545   GLUCOSEU NEGATIVE 04/24/2017 1545   HGBUR NEGATIVE 04/24/2017 1545   HGBUR negative 01/28/2009 1523   BILIRUBINUR NEGATIVE 04/24/2017 1545   BILIRUBINUR NEG 04/04/2011 0903   KETONESUR NEGATIVE 04/24/2017 1545   PROTEINUR NEGATIVE 04/24/2017 1545   UROBILINOGEN 0.2 04/04/2011 0903   UROBILINOGEN 0.2  01/28/2009 1523   NITRITE NEGATIVE 04/24/2017 1545   LEUKOCYTESUR NEGATIVE 04/24/2017 1545     Yohance Hathorne M.D. [facility] 04/27/2017, 11:57 AM  Pager: [phone] Between 7am to 7pm - call Pager - [phone]  After 7pm go to [email or web address] - password [email or web address]  Call night coverage person covering after 7pm

## 2017-04-27 NOTE — Clinical Social Work Placement (Signed)
    1:04 PM [facility]  LCSW confirmed bed with facility. Room 117B.  LCSW faxed documents to facility.  Patient will transport by [facility].   Family notified of transport.   RN report number: [phone]  Judd LienBernette Melbourne Jakubiak, LSCW [place] CSW [phone]       CLINICAL SOCIAL WORK PLACEMENT  NOTE  Date:  04/27/2017  Patient Details  Name: [name] MRN: [id number] Date of Birth: [birth year]  Clinical Social Work is seeking post-discharge placement for this patient at the Skilled  Nursing Facility level of care (*CSW will initial, date and re-position this form in  chart as items are completed):  Yes   Patient/family provided with [facility] Clinical Social Work Department's list of facilities offering this level of care within the geographic area requested by the patient (or if unable, by the patient's family).  Yes   Patient/family informed of their freedom to choose among providers that offer the needed level of care, that participate in Medicare, Medicaid or managed care program needed by the patient, have an available bed and are willing to accept the patient.      Patient/family informed of [facility]'s ownership interest in [facility] and [facility], as well as of the fact that they are under no obligation to receive care at these facilities.  PASRR submitted to EDS on       PASRR number received on 04/26/17     Existing PASRR number confirmed on       FL2 transmitted to all facilities in geographic area requested by pt/family on 04/19/17     FL2 transmitted to all facilities within larger geographic area on       Patient informed that his/her managed care company has contracts with or will negotiate with certain facilities, including the following:        Yes   Patient/family informed of bed offers received.  [facility]     Physician recommends and patient  chooses bed at      Patient to be [facility] on 04/27/17.  Patient to be transferred to facility by EMS     Patient family notified on 04/27/17 of transfer.  Name of family member notified:  [name]     PHYSICIAN       Additional Comment:    _______________________________________________ Coralyn HellingBernette Shikha Bibb, LCSW 04/27/2017, 1:03 PM

## 2017-05-01 ENCOUNTER — Encounter: Payer: Self-pay | Admitting: Adult Health

## 2017-05-01 ENCOUNTER — Non-Acute Institutional Stay (SKILLED_NURSING_FACILITY): Payer: Medicare Other | Admitting: Adult Health

## 2017-05-01 DIAGNOSIS — K219 Gastro-esophageal reflux disease without esophagitis: Secondary | ICD-10-CM | POA: Insufficient documentation

## 2017-05-01 DIAGNOSIS — T464X5A Adverse effect of angiotensin-converting-enzyme inhibitors, initial encounter: Secondary | ICD-10-CM

## 2017-05-01 DIAGNOSIS — T783XXD Angioneurotic edema, subsequent encounter: Secondary | ICD-10-CM

## 2017-05-01 DIAGNOSIS — T783XXA Angioneurotic edema, initial encounter: Secondary | ICD-10-CM

## 2017-05-01 DIAGNOSIS — G309 Alzheimer's disease, unspecified: Secondary | ICD-10-CM | POA: Diagnosis not present

## 2017-05-01 DIAGNOSIS — F0281 Dementia in other diseases classified elsewhere with behavioral disturbance: Secondary | ICD-10-CM | POA: Diagnosis not present

## 2017-05-01 DIAGNOSIS — T464X5D Adverse effect of angiotensin-converting-enzyme inhibitors, subsequent encounter: Secondary | ICD-10-CM | POA: Diagnosis not present

## 2017-05-01 DIAGNOSIS — I10 Essential (primary) hypertension: Secondary | ICD-10-CM | POA: Diagnosis not present

## 2017-05-01 DIAGNOSIS — M810 Age-related osteoporosis without current pathological fracture: Secondary | ICD-10-CM | POA: Diagnosis not present

## 2017-05-01 HISTORY — DX: Adverse effect of angiotensin-converting-enzyme inhibitors, initial encounter: T46.4X5A

## 2017-05-01 NOTE — Progress Notes (Signed)
Location:   [facility] Room Number: 117 B Place of Service:  SNF (31)   CODE STATUS: Full Code  Allergies  Allergen Reactions  . Losartan Swelling    angioedema    Chief Complaint  Patient presents with  . Hospitalization Follow-up    Hospital follow up    HPI:  She is a 77 year old who had been in an assisted living. She is being treated for angioedema secondary to ACE. She was taken back to the ED for n/v. She is unable to participate in the phi or ros. She was treated for acute metabolic encephalopathy her urine culture was negative. She insulin and glimepiride was stopped in the hospital. More than likely this does represent a long term placement for her. She will continue to be followed for her chronic illnesses including: hypertension; diabetes; dementia. There are no reports of fevers; uncontrolled pain or changes in appetite. There are no nursing concerns at this time.   Past Medical History:  Diagnosis Date  . Anxiety   . Blepharitis   . Dementia   . Depression   . Diabetes mellitus   . GERD (gastroesophageal reflux disease)   . Heart murmur, systolic   . Hepatitis   . Hyperlipidemia   . Hypertension   . Osteoporosis   . Urinary frequency     Past Surgical History:  Procedure Laterality Date  . COLONOSCOPY N/A 02/16/2012   Procedure: COLONOSCOPY;  Surgeon: Vertell Novak., MD;  Location: [facility] ENDOSCOPY;  Service: Endoscopy;  Laterality: N/A;  . ESOPHAGOGASTRODUODENOSCOPY N/A 02/16/2012   Procedure: ESOPHAGOGASTRODUODENOSCOPY (EGD);  Surgeon: Vertell Novak., MD;  Location: [facility] ENDOSCOPY;  Service: Endoscopy;  Laterality: N/A;  . THYROIDECTOMY     in 2000    Social History   Socioeconomic History  . Marital status: Widowed    Spouse name: Not on file  . Number of children: Not on file  . Years of education: 83  . Highest education level: Not on file  Occupational History  . Not on file  Social Needs  . Financial resource  strain: Not on file  . Food insecurity:    Worry: Not on file    Inability: Not on file  . Transportation needs:    Medical: Not on file    Non-medical: Not on file  Tobacco Use  . Smoking status: Never Smoker  . Smokeless tobacco: Never Used  Substance and Sexual Activity  . Alcohol use: No    Alcohol/week: 0.0 oz  . Drug use: No  . Sexual activity: Never  Lifestyle  . Physical activity:    Days per week: Not on file    Minutes per session: Not on file  . Stress: Not on file  Relationships  . Social connections:    Talks on phone: Not on file    Gets together: Not on file    Attends religious service: Not on file    Active member of club or organization: Not on file    Attends meetings of clubs or organizations: Not on file    Relationship status: Not on file  . Intimate partner violence:    Fear of current or ex partner: Not on file    Emotionally abused: Not on file    Physically abused: Not on file    Forced sexual activity: Not on file  Other Topics Concern  . Not on file  Social History Narrative   Lives with daughter and grand-daughter  to help informally supervise pt at home.   Pt also in adult daycare during the week.     Family History  Problem Relation Age of Onset  . Cancer Sister        brain  . Diabetes Son   . Hypertension Son   . Diabetes Daughter       VITAL SIGNS BP 126/74   Pulse 70   Temp (!) 96.8 F (36 C)   Resp 18   Ht  (1.549 m)   Wt 129 lb 3.2 oz (58.6 kg)   SpO2 98%   BMI 24.41 kg/m   Outpatient Encounter Medications as of 05/01/2017  Medication Sig  . alendronate (FOSAMAX) 70 MG tablet Take 70 mg by mouth every Tuesday. Take with a full glass of water on an empty stomach.   Marland Kitchen amLODipine (NORVASC) 5 MG tablet Take 5 mg by mouth daily.  . calcium-vitamin D (OSCAL WITH D) 500-200 MG-UNIT per tablet Take 1 tablet by mouth daily.  . cetirizine (ZYRTEC) 10 MG tablet Take 0.5 tablets (5 mg total) by mouth daily.  Marland Kitchen donepezil  (ARICEPT) 5 MG tablet Take 1 tablet (5 mg total) by mouth at bedtime.  . famotidine (PEPCID) 20 MG tablet Take 1 tablet (20 mg total) by mouth 2 (two) times daily for 15 days.  . hydrALAZINE (APRESOLINE) 10 MG tablet Take 10 mg by mouth 3 (three) times daily.  Marland Kitchen linagliptin (TRADJENTA) 5 MG TABS tablet Take 5 mg by mouth daily.  . memantine (NAMENDA XR) 28 MG CP24 24 hr capsule Take 28 mg by mouth daily.  . metFORMIN (GLUCOPHAGE-XR) 500 MG 24 hr tablet Take 500 mg by mouth every evening.  . metoprolol succinate (TOPROL-XL) 25 MG 24 hr tablet Take 25 mg by mouth daily.  . mirtazapine (REMERON) 15 MG tablet Take 15 mg by mouth at bedtime.  Marland Kitchen omeprazole (PRILOSEC) 40 MG capsule Take 1 capsule (40 mg total) by mouth daily.  . predniSONE (STERAPRED UNI-PAK 21 TAB) 10 MG (21) TBPK tablet Take by mouth daily. Follow package instuctions Starting 05/01/17 and ending on 05/05/17  . [DISCONTINUED] predniSONE (STERAPRED UNI-PAK 21 TAB) 10 MG (21) TBPK tablet Use per package instruction (Patient not taking: Reported on 05/01/2017)   No facility-administered encounter medications on file as of 05/01/2017.      SIGNIFICANT DIAGNOSTIC EXAMS  TODAY:  04-24-17: chest x-ray: No active cardiopulmonary disease.   04-24-17: ct of head: Atrophy and small vessel disease, progressive since 2007. No acute intracranial findings.  04-24-17: ct of abdomen and pelvis: 1. Mild diffuse small bowel distention without mechanical bowel obstruction compatible with a mild small bowel enteritis. 2. Coronary arteriosclerosis. 3. Punctate nonobstructing right lower pole renal calculus. No hydroureteronephrosis. 4. Fibroid uterus.   LABS REVIEWED;   04-24-17: wbc 9.5; hgb 15.1; hct 45.0; mcv 247; plt 247 glucose 177 bun 30; creat 1.04; k+ 4.7; na++ 145; ca 9.7; liver normal albumin 4.0; urine culture no growth 04-25-17: wbc 7.3; hgb 13.3; hct 40.0; mcv 95.7; plt 194; glucose 105; bun 24; creat 0.97; k+ 4.2; na++ 143; ca 8.6; liver  normal albumin 3.2; mag 1.7; phos 2.2; hgb a1c 5.6     Review of Systems  Unable to perform ROS: Dementia (nonverbal )     Physical Exam  Constitutional: No distress.  Frail   Neck: Neck supple. No thyromegaly present.  Cardiovascular: Normal rate, regular rhythm, normal heart sounds and intact distal pulses.  Pulmonary/Chest: Effort normal and breath sounds  normal. No respiratory distress.  Abdominal: Soft. Bowel sounds are normal. She exhibits no distension. There is no tenderness.  Musculoskeletal: She exhibits no edema.  Is able to move extremities   Lymphadenopathy:    She has no cervical adenopathy.  Neurological:  Is aware   Skin: Skin is warm and dry. She is not diaphoretic.     ASSESSMENT/ PLAN:  TODAY:   1. Osteoporosis: stable will continue fosamax 70 mg weekly and is on calcium supplement.   2. Essential hypertension: stable b/p 126/74: will continue norvasc 5 mg daily; toprol xl 25 mg daily  and apresoline 10 mgm three times daily   3. Angioedema related to use of ACE: stable will continue prednisone taper; zyrtec 5 mg daily and pepcid 20 mg twice daily for 15 days   4. gerd without esophagitis: stable will continue prilosec 40 mg daily   5. Dementia, alzheimer's with behavior disturbance; without change weight is 129 pounds; will continue aricept 5 mg daily and namenda xr 28 mg daily   6. Diabetes type II non insulin dependent: stable hgb a1c 5.6; will continue metformin xr 500 mg daily and tradjenta 5 mg daily   7. Depression: stable will continue remeron 15 mg nightly    Will check cbc; bmp on 05-03-17.      MD is aware of resident's narcotic use and is in agreement with current plan of care. We will attempt to wean resident as apropriate   Synthia Innocent NP [facility]  Contact [phone] Monday through Friday 8am- 5pm  After hours call [phone]

## 2017-05-04 ENCOUNTER — Non-Acute Institutional Stay (SKILLED_NURSING_FACILITY): Payer: Medicare Other | Admitting: Internal Medicine

## 2017-05-04 ENCOUNTER — Encounter: Payer: Self-pay | Admitting: Internal Medicine

## 2017-05-04 DIAGNOSIS — E1159 Type 2 diabetes mellitus with other circulatory complications: Secondary | ICD-10-CM | POA: Diagnosis not present

## 2017-05-04 DIAGNOSIS — R5381 Other malaise: Secondary | ICD-10-CM | POA: Diagnosis not present

## 2017-05-04 DIAGNOSIS — G309 Alzheimer's disease, unspecified: Secondary | ICD-10-CM

## 2017-05-04 DIAGNOSIS — K219 Gastro-esophageal reflux disease without esophagitis: Secondary | ICD-10-CM

## 2017-05-04 DIAGNOSIS — F0281 Dementia in other diseases classified elsewhere with behavioral disturbance: Secondary | ICD-10-CM

## 2017-05-04 DIAGNOSIS — T180XXA Foreign body in mouth, initial encounter: Secondary | ICD-10-CM

## 2017-05-04 DIAGNOSIS — E1169 Type 2 diabetes mellitus with other specified complication: Secondary | ICD-10-CM

## 2017-05-04 DIAGNOSIS — I10 Essential (primary) hypertension: Secondary | ICD-10-CM

## 2017-05-04 LAB — BASIC METABOLIC PANEL
BUN: 19 (ref 4–21)
Creatinine: 0.9 (ref 0.5–1.1)
GLUCOSE: 275
Potassium: 4.4 (ref 3.4–5.3)
SODIUM: 140 (ref 137–147)

## 2017-05-04 LAB — CBC AND DIFFERENTIAL
HEMATOCRIT: 43 (ref 36–46)
Hemoglobin: 14.1 (ref 12.0–16.0)
NEUTROS ABS: 8
Platelets: 214 (ref 150–399)
WBC: 14.2

## 2017-05-04 NOTE — Progress Notes (Signed)
Patient ID: [name], female   DOB: [birth year], 77 y.o.   MRN: [id number]  Provider:  DR Elmon Kirschner Location:  [facility] Room Number: 117 B Place of Service:  SNF ([phone])  PCP: Laurann Montana, MD Patient Care Team: Laurann Montana, MD as PCP - General (Family Medicine)  Extended Emergency Contact Information Primary Emergency Contact: Kathaleen Maser of [place] Home Phone: [phone] Relation: Daughter Secondary Emergency Contact: Blain Pais [place] of [place] Home Phone: [phone] Relation: Son  Code Status: Full Code Goals of Care: Advanced Directive information Advanced Directives 05/04/2017  Does Patient Have a Medical Advance Directive? No  Would patient like information on creating a medical advance directive? No - Patient declined  Pre-existing out of facility DNR order (yellow form or pink MOST form) -      Chief Complaint  Patient presents with  . New Admit To SNF    Admission    HPI: Patient is a 77 y.o. female seen today for admission to SNF following hospital stay for acute encephalopathy, dehydration, AKI, enteritis, dementia, HTN, DM, depression, angioedema 2/2 ACEI. She has been admitted x 3 since March 2019 for various reasons. She was tx with IVF. Urine cx showed no growth. She presents to SNF for long term care.  Today she has no concerns. She states she is chewing on "a piece of candy". No nursing issues. No falls. She is a poor historian due to dementia. Hx obtained from chart. a1c 5.6%  Past Medical History:  Diagnosis Date  . Anxiety   . Blepharitis   . Dementia   . Depression   . Diabetes mellitus   . GERD (gastroesophageal reflux disease)   . Heart murmur, systolic   . Hepatitis   . Hyperlipidemia   . Hypertension   . Osteoporosis   . Urinary frequency    Past Surgical History:  Procedure Laterality Date  . COLONOSCOPY N/A 02/16/2012   Procedure: COLONOSCOPY;   Surgeon: Vertell Novak., MD;  Location: [facility] ENDOSCOPY;  Service: Endoscopy;  Laterality: N/A;  . ESOPHAGOGASTRODUODENOSCOPY N/A 02/16/2012   Procedure: ESOPHAGOGASTRODUODENOSCOPY (EGD);  Surgeon: Vertell Novak., MD;  Location: [facility] ENDOSCOPY;  Service: Endoscopy;  Laterality: N/A;  . THYROIDECTOMY     in 2000    reports that she has never smoked. She has never used smokeless tobacco. She reports that she does not drink alcohol or use drugs. Social History   Socioeconomic History  . Marital status: Widowed    Spouse name: Not on file  . Number of children: Not on file  . Years of education: 73  . Highest education level: Not on file  Occupational History  . Not on file  Social Needs  . Financial resource strain: Not on file  . Food insecurity:    Worry: Not on file    Inability: Not on file  . Transportation needs:    Medical: Not on file    Non-medical: Not on file  Tobacco Use  . Smoking status: Never Smoker  . Smokeless tobacco: Never Used  Substance and Sexual Activity  . Alcohol use: No    Alcohol/week: 0.0 oz  . Drug use: No  . Sexual activity: Never  Lifestyle  . Physical activity:    Days per week: Not on file    Minutes per session: Not on file  . Stress: Not on file  Relationships  . Social  connections:    Talks on phone: Not on file    Gets together: Not on file    Attends religious service: Not on file    Active member of club or organization: Not on file    Attends meetings of clubs or organizations: Not on file    Relationship status: Not on file  . Intimate partner violence:    Fear of current or ex partner: Not on file    Emotionally abused: Not on file    Physically abused: Not on file    Forced sexual activity: Not on file  Other Topics Concern  . Not on file  Social History Narrative   Lives with daughter and grand-daughter to help informally supervise pt at home.   Pt also in adult daycare during the week.      Functional Status  Survey:    Family History  Problem Relation Age of Onset  . Cancer Sister        brain  . Diabetes Son   . Hypertension Son   . Diabetes Daughter     Health Maintenance  Topic Date Due  . FOOT EXAM  05/04/2018 (Originally 07/20/1950)  . OPHTHALMOLOGY EXAM  05/04/2018 (Originally 07/20/1950)  . URINE MICROALBUMIN  05/04/2018 (Originally 07/20/1950)  . INFLUENZA VACCINE  08/03/2017  . HEMOGLOBIN A1C  10/25/2017  . TETANUS/TDAP  11/15/2020  . DEXA SCAN  Completed  . PNA vac Low Risk Adult  Discontinued    Allergies  Allergen Reactions  . Losartan Swelling    angioedema    Outpatient Encounter Medications as of 05/04/2017  Medication Sig  . alendronate (FOSAMAX) 70 MG tablet Take 70 mg by mouth every Tuesday. Take with a full glass of water on an empty stomach.   Marland Kitchen amLODipine (NORVASC) 5 MG tablet Take 5 mg by mouth daily.  . calcium-vitamin D (OSCAL WITH D) 500-200 MG-UNIT per tablet Take 1 tablet by mouth daily.  . cetirizine (ZYRTEC) 10 MG tablet Take 0.5 tablets (5 mg total) by mouth daily.  Marland Kitchen donepezil (ARICEPT) 5 MG tablet Take 1 tablet (5 mg total) by mouth at bedtime.  . famotidine (PEPCID) 20 MG tablet Take 1 tablet (20 mg total) by mouth 2 (two) times daily for 15 days.  . hydrALAZINE (APRESOLINE) 10 MG tablet Take 10 mg by mouth 3 (three) times daily.  Marland Kitchen linagliptin (TRADJENTA) 5 MG TABS tablet Take 5 mg by mouth daily.  . memantine (NAMENDA XR) 28 MG CP24 24 hr capsule Take 28 mg by mouth daily.  . metFORMIN (GLUCOPHAGE-XR) 500 MG 24 hr tablet Take 500 mg by mouth every evening.  . metoprolol succinate (TOPROL-XL) 25 MG 24 hr tablet Take 25 mg by mouth daily.  . mirtazapine (REMERON) 15 MG tablet Take 15 mg by mouth at bedtime.  . Nutritional Supplements (NUTRITIONAL SUPPLEMENT PO) CCD / NAS diet - Mechanical Soft texture, regular / thin consistency  . omeprazole (PRILOSEC) 40 MG capsule Take 1 capsule (40 mg total) by mouth daily.  . predniSONE (STERAPRED UNI-PAK 21  TAB) 10 MG (21) TBPK tablet Take by mouth daily. Follow package instuctions Starting 05/01/17 and ending on 05/05/17   No facility-administered encounter medications on file as of 05/04/2017.     Review of Systems  Unable to perform ROS: Dementia    Vitals:   05/04/17 0859  BP: 127/72  Pulse: 60  Resp: 18  Temp: (!) 96.4 F (35.8 C)  SpO2: 97%  Weight: 129 lb 3.2 oz (58.6  kg)  Height:  (1.549 m)   Body mass index is 24.41 kg/m. Physical Exam  Constitutional: She appears well-developed.  Frail appearing,  Lying in bed in NAD  HENT:  Mouth/Throat: Oropharynx is clear and moist. No oropharyngeal exudate.  Rectangular thin firm object in mouth approx size of 3 sticks of gum in mouth. MMM. No oral thrush   Eyes: Pupils are equal, round, and reactive to light. No scleral icterus.  Neck: Neck supple. Carotid bruit is not present. No tracheal deviation present. No thyromegaly present.  Cardiovascular: Normal rate, regular rhythm and intact distal pulses. Exam reveals no gallop and no friction rub.  Murmur (1/6 SEM) heard. No LE edema b/l. no calf TTP.   Pulmonary/Chest: Effort normal and breath sounds normal. No stridor. No respiratory distress. She has no wheezes. She has no rales.  Abdominal: Soft. Bowel sounds are normal. She exhibits no distension and no mass. There is no hepatomegaly. There is no tenderness. There is no rebound and no guarding.  Musculoskeletal: She exhibits edema.  Lymphadenopathy:    She has no cervical adenopathy.  Neurological: She is alert. She has normal reflexes.  Skin: Skin is warm and dry. No rash noted.  Psychiatric: She has a normal mood and affect. Her speech is normal and behavior is normal. Thought content is delusional.    Labs reviewed: Basic Metabolic Panel: Recent Labs    04/23/17 0821 04/24/17 1545 04/25/17 0051  NA 139 145 143  K 3.9 4.7 4.2  CL 104 107 111  CO2 GLUCOSE 172* 177* 105*  BUN 13 30* 24*  CREATININE 0.84  1.04* 0.97  CALCIUM 9.1 9.7 8.6*  MG  --   --  1.7  PHOS  --   --  2.2*   Liver Function Tests: Recent Labs    03/09/17 1927 04/24/17 1545 04/25/17 0051  AST 42* 31 30  ALT 33 31 26  ALKPHOS 73 86 66  BILITOT 0.5 0.8 0.7  PROT 7.4 8.4* 6.7  ALBUMIN 3.9 4.0 3.2*   Recent Labs    04/24/17 1545  LIPASE 40   No results for input(s): AMMONIA in the last 8760 hours. CBC: Recent Labs    03/09/17 1927 04/22/17 1158 04/23/17 0821 04/24/17 1545 04/25/17 0051  WBC 8.8 5.8 13.0* 9.5 7.3  NEUTROABS 4.4 3.3  --  7.6  --   HGB 12.6 12.0 12.7 15.1* 13.3  HCT 37.9 37.6 38.3 45.0 40.0  MCV 93.1 94.7 93.9 96.4 95.7  PLT 223 197 218 247 194   Cardiac Enzymes: Recent Labs    04/25/17 0051 04/25/17 0617 04/25/17 1325  TROPONINI <0.03 <0.03 <0.03   BNP: Invalid input(s): POCBNP Lab Results  Component Value Date   HGBA1C 5.6 04/25/2017   Lab Results  Component Value Date   TSH 2.247 04/25/2017   Lab Results  Component Value Date   VITAMINB12 797 02/14/2012   Lab Results  Component Value Date   FOLATE >20.0 02/14/2012   Lab Results  Component Value Date   IRON <10 (L) 02/14/2012   TIBC Not calculated due to Iron <10. 02/14/2012   FERRITIN 5 (L) 02/14/2012    Imaging and Procedures obtained prior to SNF admission: Ct Abdomen Pelvis Wo Contrast  Result Date: 04/24/2017 CLINICAL DATA:  Vomiting and diarrhea x3 episodes this morning. Patient was seen for allergic reaction yesterday. Concern for dehydration. EXAM: CT ABDOMEN AND PELVIS WITHOUT CONTRAST TECHNIQUE: Multidetector CT imaging of the abdomen and  pelvis was performed following the standard protocol without IV contrast. COMPARISON:  02/16/2012 pelvic ultrasound FINDINGS: Lower chest: Top normal heart size without pericardial effusion. The visualized thoracic aorta demonstrates minimal atherosclerosis. There is coronary arteriosclerosis along the left circumflex and LAD. Hepatobiliary: Biliary sludge and calculi  are identified within the gallbladder. No pericholecystic fluid or wall thickening. The unenhanced liver is unremarkable. Pancreas: No ductal dilatation, mass or inflammation given limitations of a noncontrast study. Spleen: Normal size spleen without focal mass. Adrenals/Urinary Tract: Normal bilateral adrenal glands. Punctate nonobstructing calculus in the lower pole of the right kidney. No hydroureteronephrosis. Bladder unremarkable for degree of distention. Stomach/Bowel: The stomach is distended with ingested food. There is normal small rotation. Mild fluid-filled distention of small bowel without mechanical source of bowel obstruction. Findings may reflect a mild small bowel enteritis. Normal appendix visualized. Colonic diverticulosis without acute diverticulitis. Vascular/Lymphatic: Aortoiliac atherosclerosis. No aneurysm. No lymphadenopathy. Reproductive: Calcified uterine fibroids are noted. No adnexal mass. Other: Tiny umbilical fat containing hernia. No free air nor free fluid. Musculoskeletal: Degenerative disc disease L4-5 and L5-S1 with left central to foraminal small disc herniation at L4-5. IMPRESSION: 1. Mild diffuse small bowel distention without mechanical bowel obstruction compatible with a mild small bowel enteritis. 2. Coronary arteriosclerosis. 3. Punctate nonobstructing right lower pole renal calculus. No hydroureteronephrosis. 4. Fibroid uterus. Electronically Signed   By: Tollie Eth M.D.   On: 04/24/2017 17:58   Dg Chest 2 View  Result Date: 04/24/2017 CLINICAL DATA:  Nausea and vomiting. EXAM: CHEST - 2 VIEW COMPARISON:  Chest x-ray dated March 10, 2017. FINDINGS: The heart size and mediastinal contours are within normal limits. Normal pulmonary vascularity. No focal consolidation, pleural effusion, or pneumothorax. No acute osseous abnormality. IMPRESSION: No active cardiopulmonary disease. Electronically Signed   By: Obie Dredge M.D.   On: 04/24/2017 16:51   Ct Head Wo  Contrast  Result Date: 04/24/2017 CLINICAL DATA:  Altered mental status, dehydration, vomiting. EXAM: CT HEAD WITHOUT CONTRAST TECHNIQUE: Contiguous axial images were obtained from the base of the skull through the vertex without intravenous contrast. COMPARISON:  CT head 04/26/2005. FINDINGS: Brain: No evidence for acute infarction, hemorrhage, mass lesion, hydrocephalus, or extra-axial fluid. Advanced atrophy. Hypoattenuation of white matter, likely small vessel disease. Vascular: Calcification of the cavernous internal carotid arteries consistent with cerebrovascular atherosclerotic disease. No signs of intracranial large vessel occlusion. Skull: Normal. Negative for fracture or focal lesion. Sinuses/Orbits: No acute finding. Other: None. IMPRESSION: Atrophy and small vessel disease, progressive since 2007. No acute intracranial findings. Electronically Signed   By: Elsie Stain M.D.   On: 04/24/2017 20:46    Assessment/Plan   ICD-10-CM   1. Foreign body in mouth, initial encounter T18.0XXA   2. Alzheimer's dementia with behavioral disturbance, unspecified timing of dementia onset G30.9    F02.81   3. Physical deconditioning R53.81   4. Hypertension associated with diabetes (HCC) E11.59    I10   5. GERD without esophagitis K21.9   6. Type 2 diabetes mellitus with other specified complication, without long-term current use of insulin (HCC) E11.69      FB removed without complication - monitor oral cavity for FB q shift  Cont current meds as ordered  PT/OT/ST as ordered  Nutritional supplements as ordered  GOAL: short term rehab then probable long term care. Communicated with pt and nursing.  Will follow  Labs/tests ordered: none    Alantra Popoca S. [facility] [place],  [id number] [phone] Cell (Monday-Friday 8 AM - 5 PM) [phone] After 5 PM and follow prompts

## 2017-05-08 ENCOUNTER — Encounter: Payer: Self-pay | Admitting: Adult Health

## 2017-05-08 ENCOUNTER — Non-Acute Institutional Stay (SKILLED_NURSING_FACILITY): Payer: Medicare Other | Admitting: Adult Health

## 2017-05-08 DIAGNOSIS — I10 Essential (primary) hypertension: Secondary | ICD-10-CM | POA: Diagnosis not present

## 2017-05-08 DIAGNOSIS — T783XXD Angioneurotic edema, subsequent encounter: Secondary | ICD-10-CM | POA: Diagnosis not present

## 2017-05-08 DIAGNOSIS — T464X5D Adverse effect of angiotensin-converting-enzyme inhibitors, subsequent encounter: Secondary | ICD-10-CM

## 2017-05-08 DIAGNOSIS — M810 Age-related osteoporosis without current pathological fracture: Secondary | ICD-10-CM | POA: Diagnosis not present

## 2017-05-08 DIAGNOSIS — E119 Type 2 diabetes mellitus without complications: Secondary | ICD-10-CM

## 2017-05-08 NOTE — Progress Notes (Signed)
Location:   [facility] Room Number: 117 B Place of Service:  SNF (31)   CODE STATUS: Full code  Allergies  Allergen Reactions  . Losartan Swelling    angioedema    Chief Complaint  Patient presents with  . Medical Management of Chronic Issues    Diabetes; osteoporosis; angioedema; hypertension; weekly follow up for the first 30 days post hospitalization     HPI:  She is a 77 year old long term resident of this facility being seen for the management of her chronic illnesses; diabetes; osteoporosis; angioedema; hypertension. She is unable to participate in the hpi or ros. All of her cbg readings are >200. There are no reports of uncontrolled pain; no changes in appetite; no behavioral issues. There are no nursing concerns at this time.   Past Medical History:  Diagnosis Date  . Anxiety   . Blepharitis   . Dementia   . Depression   . Diabetes mellitus   . GERD (gastroesophageal reflux disease)   . Heart murmur, systolic   . Hepatitis   . Hyperlipidemia   . Hypertension   . Osteoporosis   . Urinary frequency     Past Surgical History:  Procedure Laterality Date  . COLONOSCOPY N/A 02/16/2012   Procedure: COLONOSCOPY;  Surgeon: Vertell Novak., MD;  Location: [facility] ENDOSCOPY;  Service: Endoscopy;  Laterality: N/A;  . ESOPHAGOGASTRODUODENOSCOPY N/A 02/16/2012   Procedure: ESOPHAGOGASTRODUODENOSCOPY (EGD);  Surgeon: Vertell Novak., MD;  Location: [facility] ENDOSCOPY;  Service: Endoscopy;  Laterality: N/A;  . THYROIDECTOMY     in 2000    Social History   Socioeconomic History  . Marital status: Widowed    Spouse name: Not on file  . Number of children: Not on file  . Years of education: 17  . Highest education level: Not on file  Occupational History  . Not on file  Social Needs  . Financial resource strain: Not on file  . Food insecurity:    Worry: Not on file    Inability: Not on file  . Transportation needs:    Medical: Not on file   Non-medical: Not on file  Tobacco Use  . Smoking status: Never Smoker  . Smokeless tobacco: Never Used  Substance and Sexual Activity  . Alcohol use: No    Alcohol/week: 0.0 oz  . Drug use: No  . Sexual activity: Never  Lifestyle  . Physical activity:    Days per week: Not on file    Minutes per session: Not on file  . Stress: Not on file  Relationships  . Social connections:    Talks on phone: Not on file    Gets together: Not on file    Attends religious service: Not on file    Active member of club or organization: Not on file    Attends meetings of clubs or organizations: Not on file    Relationship status: Not on file  . Intimate partner violence:    Fear of current or ex partner: Not on file    Emotionally abused: Not on file    Physically abused: Not on file    Forced sexual activity: Not on file  Other Topics Concern  . Not on file  Social History Narrative   Lives with daughter and grand-daughter to help informally supervise pt at home.   Pt also in adult daycare during the week.     Family History  Problem Relation Age of Onset  .  Cancer Sister        brain  . Diabetes Son   . Hypertension Son   . Diabetes Daughter       VITAL SIGNS BP (!) 142/76   Pulse 96   Temp 98.6 F (37 C)   Resp 16   Ht  (1.549 m)   Wt 129 lb 3.2 oz (58.6 kg)   SpO2 97%   BMI 24.41 kg/m   Outpatient Encounter Medications as of 05/08/2017  Medication Sig  . alendronate (FOSAMAX) 70 MG tablet Take 70 mg by mouth every Tuesday. Take with a full glass of water on an empty stomach.   Marland Kitchen amLODipine (NORVASC) 5 MG tablet Take 5 mg by mouth daily.  . calcium-vitamin D (OSCAL WITH D) 500-200 MG-UNIT per tablet Take 1 tablet by mouth daily.  . cetirizine (ZYRTEC) 10 MG tablet Take 0.5 tablets (5 mg total) by mouth daily.  Marland Kitchen donepezil (ARICEPT) 5 MG tablet Take 1 tablet (5 mg total) by mouth at bedtime.  . famotidine (PEPCID) 20 MG tablet Take 1 tablet (20 mg total) by mouth 2  (two) times daily for 15 days.  . hydrALAZINE (APRESOLINE) 10 MG tablet Take 10 mg by mouth 3 (three) times daily.  Marland Kitchen linagliptin (TRADJENTA) 5 MG TABS tablet Take 5 mg by mouth daily.  . memantine (NAMENDA XR) 28 MG CP24 24 hr capsule Take 28 mg by mouth daily.  . metFORMIN (GLUCOPHAGE-XR) 500 MG 24 hr tablet Take 500 mg by mouth every evening.  . metoprolol succinate (TOPROL-XL) 25 MG 24 hr tablet Take 25 mg by mouth daily.  . mirtazapine (REMERON) 15 MG tablet Take 15 mg by mouth at bedtime.  . Nutritional Supplements (NUTRITIONAL SUPPLEMENT PO) CCD / NAS diet - Mechanical Soft texture, regular / thin consistency  . omeprazole (PRILOSEC) 40 MG capsule Take 1 capsule (40 mg total) by mouth daily.   No facility-administered encounter medications on file as of 05/08/2017.      SIGNIFICANT DIAGNOSTIC EXAMS  PREVIOUS:  04-24-17: chest x-ray: No active cardiopulmonary disease.   04-24-17: ct of head: Atrophy and small vessel disease, progressive since 2007. No acute intracranial findings.  04-24-17: ct of abdomen and pelvis: 1. Mild diffuse small bowel distention without mechanical bowel obstruction compatible with a mild small bowel enteritis. 2. Coronary arteriosclerosis. 3. Punctate nonobstructing right lower pole renal calculus. No hydroureteronephrosis. 4. Fibroid uterus.  NO NEW EXAMS    LABS REVIEWED; PEVIOUS  04-24-17: wbc 9.5; hgb 15.1; hct 45.0; mcv 247; plt 247 glucose 177 bun 30; creat 1.04; k+ 4.7; na++ 145; ca 9.7; liver normal albumin 4.0; urine culture no growth 04-25-17: wbc 7.3; hgb 13.3; hct 40.0; mcv 95.7; plt 194; glucose 105; bun 24; creat 0.97; k+ 4.2; na++ 143; ca 8.6; liver normal albumin 3.2; mag 1.7; phos 2.2; hgb a1c 5.6   TODAY;   05-04-17: wbc 14.2; hgb 14.1; hct 42.7; mcv 95.2; plt 214; glucose 275; bun 18.5; creat 0.86; k+ 4.4; an++ 140; ca 9.4     Review of Systems  Unable to perform ROS: Dementia (nonverbal )    Physical Exam  Constitutional: No  distress.  Frail   Neck: No thyromegaly present.  Cardiovascular: Normal rate, regular rhythm, normal heart sounds and intact distal pulses.  Pulmonary/Chest: Effort normal and breath sounds normal. No respiratory distress.  Abdominal: Soft. Bowel sounds are normal. She exhibits no distension. There is no tenderness.  Musculoskeletal: She exhibits no edema.  Able to move all  extremities   Lymphadenopathy:    She has no cervical adenopathy.  Neurological:  Is aware   Skin: Skin is warm and dry. She is not diaphoretic.      ASSESSMENT/ PLAN:  TODAY:   1. Osteoporosis: stable will continue fosamax 70 mg weekly and is on calcium supplement.   2. Essential hypertension: stable b/p 126/74: will continue norvasc 5 mg daily; toprol xl 25 mg daily  and apresoline 10 mgm three times daily   3. Angioedema related to use of ACE: stable will continue zyrtec 5 mg daily and pepcid 20 mg twice daily for 15 days has finished prednisone taper   4. Diabetes type II non insulin dependent: all cbgs are >200  hgb a1c 5.6; will continue metformin xr 500 mg daily and tradjenta 5 mg daily will begin lantus 10 units nightly and will monitor   PREVIOUS  5. Dementia, alzheimer's with behavior disturbance; without change weight is 129 pounds; will continue aricept 5 mg daily and namenda xr 28 mg daily   6. Diabetes type II non insulin dependent: all cbgs are >200  hgb a1c 5.6; will continue metformin xr 500 mg daily and tradjenta 5 mg daily will begin lantus 10 units nightly and will monitor   7. Depression: stable will continue remeron 15 mg nightly   8. gerd without esophagitis: stable will continue prilosec 40 mg daily     MD is aware of resident's narcotic use and is in agreement with current plan of care. We will attempt to wean resident as apropriate   Synthia Innocent NP [facility]  Contact [phone] Monday through Friday 8am- 5pm  After hours call [phone]

## 2017-05-15 ENCOUNTER — Encounter: Payer: Self-pay | Admitting: Adult Health

## 2017-05-15 ENCOUNTER — Non-Acute Institutional Stay (SKILLED_NURSING_FACILITY): Payer: Medicare Other | Admitting: Adult Health

## 2017-05-15 DIAGNOSIS — F324 Major depressive disorder, single episode, in partial remission: Secondary | ICD-10-CM

## 2017-05-15 DIAGNOSIS — F0281 Dementia in other diseases classified elsewhere with behavioral disturbance: Secondary | ICD-10-CM

## 2017-05-15 DIAGNOSIS — G309 Alzheimer's disease, unspecified: Secondary | ICD-10-CM | POA: Diagnosis not present

## 2017-05-15 DIAGNOSIS — K219 Gastro-esophageal reflux disease without esophagitis: Secondary | ICD-10-CM | POA: Diagnosis not present

## 2017-05-15 NOTE — Progress Notes (Signed)
Location:   [facility] Room Number: 117 B Place of Service:  SNF (31)   CODE STATUS: Full Code  Allergies  Allergen Reactions  . Losartan Swelling    angioedema    Chief Complaint  Patient presents with  . Medical Management of Chronic Issues    Gerd; dementia; depression. Weekly follow up for the first 30 days post hospitalization     HPI:  She is a 77 year old long term resident of this facility being seen for the management of her chronic illnesses; gerd; dementia; depression. She is unable to participate in the phi or ros. There are no reports of changes in appetite; no uncontrolled pain; no reports of insomnia. There are no nursing concerns at this time.   Past Medical History:  Diagnosis Date  . Anxiety   . Blepharitis   . Dementia   . Depression   . Diabetes mellitus   . GERD (gastroesophageal reflux disease)   . Heart murmur, systolic   . Hepatitis   . Hyperlipidemia   . Hypertension   . Osteoporosis   . Urinary frequency     Past Surgical History:  Procedure Laterality Date  . COLONOSCOPY N/A 02/16/2012   Procedure: COLONOSCOPY;  Surgeon: Vertell Novak., MD;  Location: [facility] ENDOSCOPY;  Service: Endoscopy;  Laterality: N/A;  . ESOPHAGOGASTRODUODENOSCOPY N/A 02/16/2012   Procedure: ESOPHAGOGASTRODUODENOSCOPY (EGD);  Surgeon: Vertell Novak., MD;  Location: [facility] ENDOSCOPY;  Service: Endoscopy;  Laterality: N/A;  . THYROIDECTOMY     in 2000    Social History   Socioeconomic History  . Marital status: Widowed    Spouse name: Not on file  . Number of children: Not on file  . Years of education: 32  . Highest education level: Not on file  Occupational History  . Not on file  Social Needs  . Financial resource strain: Not on file  . Food insecurity:    Worry: Not on file    Inability: Not on file  . Transportation needs:    Medical: Not on file    Non-medical: Not on file  Tobacco Use  . Smoking status: Never Smoker  .  Smokeless tobacco: Never Used  Substance and Sexual Activity  . Alcohol use: No    Alcohol/week: 0.0 oz  . Drug use: No  . Sexual activity: Never  Lifestyle  . Physical activity:    Days per week: Not on file    Minutes per session: Not on file  . Stress: Not on file  Relationships  . Social connections:    Talks on phone: Not on file    Gets together: Not on file    Attends religious service: Not on file    Active member of club or organization: Not on file    Attends meetings of clubs or organizations: Not on file    Relationship status: Not on file  . Intimate partner violence:    Fear of current or ex partner: Not on file    Emotionally abused: Not on file    Physically abused: Not on file    Forced sexual activity: Not on file  Other Topics Concern  . Not on file  Social History Narrative   Lives with daughter and grand-daughter to help informally supervise pt at home.   Pt also in adult daycare during the week.     Family History  Problem Relation Age of Onset  . Cancer Sister  brain  . Diabetes Son   . Hypertension Son   . Diabetes Daughter       VITAL SIGNS BP (!) 123/92   Pulse 80   Temp 97.6 F (36.4 C)   Resp 20   Ht  (1.549 m)   Wt 129 lb 3.2 oz (58.6 kg)   SpO2 97%   BMI 24.41 kg/m   Outpatient Encounter Medications as of 05/15/2017  Medication Sig  . alendronate (FOSAMAX) 70 MG tablet Take 70 mg by mouth every Tuesday. Take with a full glass of water on an empty stomach.   Marland Kitchen amLODipine (NORVASC) 5 MG tablet Take 5 mg by mouth daily.  . calcium-vitamin D (OSCAL WITH D) 500-200 MG-UNIT per tablet Take 1 tablet by mouth daily.  . cetirizine (ZYRTEC) 10 MG tablet Take 0.5 tablets (5 mg total) by mouth daily.  Marland Kitchen donepezil (ARICEPT) 5 MG tablet Take 1 tablet (5 mg total) by mouth at bedtime.  . hydrALAZINE (APRESOLINE) 10 MG tablet Take 10 mg by mouth 3 (three) times daily.  . Insulin Glargine (LANTUS SOLOSTAR) 100 UNIT/ML Solostar Pen  Inject 10 Units into the skin daily at 10 pm.  . linagliptin (TRADJENTA) 5 MG TABS tablet Take 5 mg by mouth daily.  . memantine (NAMENDA XR) 28 MG CP24 24 hr capsule Take 28 mg by mouth daily.  . metFORMIN (GLUCOPHAGE-XR) 500 MG 24 hr tablet Take 500 mg by mouth every evening.  . metoprolol succinate (TOPROL-XL) 25 MG 24 hr tablet Take 25 mg by mouth daily.  . mirtazapine (REMERON) 15 MG tablet Take 15 mg by mouth at bedtime.  . Nutritional Supplements (NUTRITIONAL SUPPLEMENT PO) CCD / NAS diet - Mechanical Soft texture, regular / thin consistency  . omeprazole (PRILOSEC) 40 MG capsule Take 1 capsule (40 mg total) by mouth daily.  . famotidine (PEPCID) 20 MG tablet Take 1 tablet (20 mg total) by mouth 2 (two) times daily for 15 days.   No facility-administered encounter medications on file as of 05/15/2017.      SIGNIFICANT DIAGNOSTIC EXAMS   PREVIOUS:  04-24-17: chest x-ray: No active cardiopulmonary disease.   04-24-17: ct of head: Atrophy and small vessel disease, progressive since 2007. No acute intracranial findings.  04-24-17: ct of abdomen and pelvis: 1. Mild diffuse small bowel distention without mechanical bowel obstruction compatible with a mild small bowel enteritis. 2. Coronary arteriosclerosis. 3. Punctate nonobstructing right lower pole renal calculus. No hydroureteronephrosis. 4. Fibroid uterus.  NO NEW EXAMS    LABS REVIEWED; PEVIOUS  04-24-17: wbc 9.5; hgb 15.1; hct 45.0; mcv 247; plt 247 glucose 177 bun 30; creat 1.04; k+ 4.7; na++ 145; ca 9.7; liver normal albumin 4.0; urine culture no growth 04-25-17: wbc 7.3; hgb 13.3; hct 40.0; mcv 95.7; plt 194; glucose 105; bun 24; creat 0.97; k+ 4.2; na++ 143; ca 8.6; liver normal albumin 3.2; mag 1.7; phos 2.2; hgb a1c 5.6   TODAY;   05-04-17: wbc 14.2; hgb 14.1; hct 42.7; mcv 95.2; plt 214; glucose 275; bun 18.5; creat 0.86; k+ 4.4; an++ 140; ca 9.4     Review of Systems  Unable to perform ROS: Dementia (nonverbal )     Physical Exam  Constitutional: No distress.  Frail   Neck: No thyromegaly present.  Cardiovascular: Normal rate, regular rhythm, normal heart sounds and intact distal pulses.  Pulmonary/Chest: Effort normal and breath sounds normal. No respiratory distress.  Abdominal: Soft. Bowel sounds are normal. She exhibits no distension. There is no  tenderness.  Musculoskeletal: She exhibits no edema.  Able to move all extremities   Lymphadenopathy:    She has no cervical adenopathy.  Neurological:  Is aware   Skin: Skin is warm and dry. She is not diaphoretic.      ASSESSMENT/ PLAN:  TODAY:   1. Dementia, alzheimer's with behavior disturbance; without change weight is 129 pounds; will continue aricept 5 mg daily and namenda xr 28 mg daily   2. Depression: stable will continue remeron 15 mg nightly   3. gerd without esophagitis: stable will continue prilosec 40 mg daily    PREVIOUS  4. Osteoporosis: stable will continue fosamax 70 mg weekly and is on calcium supplement.   5. Essential hypertension: stable b/p 126/74: will continue norvasc 5 mg daily; toprol xl 25 mg daily  and apresoline 10 mgm three times daily   6. Angioedema related to use of ACE: stable will continue zyrtec 5 mg daily and pepcid 20 mg twice daily for 15 days has finished prednisone taper   7. Diabetes type II non insulin dependent: cbgs ae starting to improve hgb a1c 5.6; will continue metformin xr 500 mg daily and tradjenta 5 mg daily and lantus 10 units nightly and will monitor         MD is aware of resident's narcotic use and is in agreement with current plan of care. We will attempt to wean resident as apropriate   Synthia Innocent NP [facility]  Contact [phone] Monday through Friday 8am- 5pm  After hours call [phone]

## 2017-05-18 ENCOUNTER — Non-Acute Institutional Stay (SKILLED_NURSING_FACILITY): Payer: Medicare Other | Admitting: Adult Health

## 2017-05-18 ENCOUNTER — Encounter: Payer: Self-pay | Admitting: Adult Health

## 2017-05-18 DIAGNOSIS — E119 Type 2 diabetes mellitus without complications: Secondary | ICD-10-CM | POA: Diagnosis not present

## 2017-05-18 NOTE — Progress Notes (Signed)
Location:   [facility] Room Number: 111 A Place of Service:  SNF (31)   CODE STATUS: Full code  Allergies  Allergen Reactions  . Losartan Swelling    angioedema    Chief Complaint  Patient presents with  . Acute Visit    DM    HPI:  She is a 77 year old long term resident of this facility being seen for the management of her diabetes. All of her cbgs are all over 200. There are no reports of excessive thirst or hunger. She is unable to participate in the hpi or ros. There are no reports of missed doses of medications.   Past Medical History:  Diagnosis Date  . 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 Anxiety   . Blepharitis   . Dementia   . Depression   . Diabetes mellitus   . Essential hypertension 01/12/2009   Qualifier: Diagnosis of  By: Alvester Morin MD, Viviann Spare    . GERD (gastroesophageal reflux disease)   . Heart murmur, systolic   . Hepatitis   . Hyperlipidemia   . Hypertension   . Osteoporosis   . Urinary frequency     Past Surgical History:  Procedure Laterality Date  . COLONOSCOPY N/A 02/16/2012   Procedure: COLONOSCOPY;  Surgeon: Vertell Novak., MD;  Location: [facility] ENDOSCOPY;  Service: Endoscopy;  Laterality: N/A;  . ESOPHAGOGASTRODUODENOSCOPY N/A 02/16/2012   Procedure: ESOPHAGOGASTRODUODENOSCOPY (EGD);  Surgeon: Vertell Novak., MD;  Location: [facility] ENDOSCOPY;  Service: Endoscopy;  Laterality: N/A;  . THYROIDECTOMY     in 2000    Social History   Socioeconomic History  . Marital status: Widowed    Spouse name: Not on file  . Number of children: Not on file  . Years of education: 92  . Highest education level: Not on file  Occupational History  . Not on file  Social Needs  . Financial resource strain: Not on file  . Food insecurity:    Worry: Not on file    Inability: Not on file  . Transportation needs:    Medical: Not on file    Non-medical: Not on file  Tobacco Use  . Smoking status: Never Smoker  . Smokeless  tobacco: Never Used  Substance and Sexual Activity  . Alcohol use: No    Alcohol/week: 0.0 oz  . Drug use: No  . Sexual activity: Never  Lifestyle  . Physical activity:    Days per week: Not on file    Minutes per session: Not on file  . Stress: Not on file  Relationships  . Social connections:    Talks on phone: Not on file    Gets together: Not on file    Attends religious service: Not on file    Active member of club or organization: Not on file    Attends meetings of clubs or organizations: Not on file    Relationship status: Not on file  . Intimate partner violence:    Fear of current or ex partner: Not on file    Emotionally abused: Not on file    Physically abused: Not on file    Forced sexual activity: Not on file  Other Topics Concern  . Not on file  Social History Narrative   Lives with daughter and grand-daughter to help informally supervise pt at home.   Pt also in adult daycare during the week.     Family History  Problem Relation Age of Onset  .  Cancer Sister        brain  . Diabetes Son   . Hypertension Son   . Diabetes Daughter       VITAL SIGNS BP 124/76   Pulse 76   Temp (!) 97.2 F (36.2 C)   Resp 18   Ht  (1.549 m)   Wt 130 lb (59 kg)   SpO2 98%   BMI 24.56 kg/m   Outpatient Encounter Medications as of 05/18/2017  Medication Sig  . alendronate (FOSAMAX) 70 MG tablet Take 70 mg by mouth every Tuesday. Take with a full glass of water on an empty stomach.   Marland Kitchen amLODipine (NORVASC) 5 MG tablet Take 5 mg by mouth daily.  . calcium-vitamin D (OSCAL WITH D) 500-200 MG-UNIT per tablet Take 1 tablet by mouth daily.  . cetirizine (ZYRTEC) 10 MG tablet Take 0.5 tablets (5 mg total) by mouth daily.  Marland Kitchen donepezil (ARICEPT) 5 MG tablet Take 1 tablet (5 mg total) by mouth at bedtime.  . hydrALAZINE (APRESOLINE) 10 MG tablet Take 10 mg by mouth 3 (three) times daily.  . Insulin Glargine (LANTUS SOLOSTAR) 100 UNIT/ML Solostar Pen Inject 10 Units into  the skin daily at 10 pm.  . linagliptin (TRADJENTA) 5 MG TABS tablet Take 5 mg by mouth daily.  . memantine (NAMENDA XR) 28 MG CP24 24 hr capsule Take 28 mg by mouth daily.  . metFORMIN (GLUCOPHAGE-XR) 500 MG 24 hr tablet Take 500 mg by mouth every evening.  . metoprolol succinate (TOPROL-XL) 25 MG 24 hr tablet Take 25 mg by mouth daily.  . mirtazapine (REMERON) 15 MG tablet Take 15 mg by mouth at bedtime.  . Nutritional Supplements (NUTRITIONAL SUPPLEMENT PO) CCD / NAS diet - Mechanical Soft texture, regular / thin consistency  . omeprazole (PRILOSEC) 40 MG capsule Take 1 capsule (40 mg total) by mouth daily.  . [DISCONTINUED] famotidine (PEPCID) 20 MG tablet Take 1 tablet (20 mg total) by mouth 2 (two) times daily for 15 days. (Patient not taking: Reported on 05/18/2017)   No facility-administered encounter medications on file as of 05/18/2017.      SIGNIFICANT DIAGNOSTIC EXAMS  PREVIOUS:  04-24-17: chest x-ray: No active cardiopulmonary disease.   04-24-17: ct of head: Atrophy and small vessel disease, progressive since 2007. No acute intracranial findings.  04-24-17: ct of abdomen and pelvis: 1. Mild diffuse small bowel distention without mechanical bowel obstruction compatible with a mild small bowel enteritis. 2. Coronary arteriosclerosis. 3. Punctate nonobstructing right lower pole renal calculus. No hydroureteronephrosis. 4. Fibroid uterus.  NO NEW EXAMS    LABS REVIEWED; PEVIOUS  04-24-17: wbc 9.5; hgb 15.1; hct 45.0; mcv 247; plt 247 glucose 177 bun 30; creat 1.04; k+ 4.7; na++ 145; ca 9.7; liver normal albumin 4.0; urine culture no growth 04-25-17: wbc 7.3; hgb 13.3; hct 40.0; mcv 95.7; plt 194; glucose 105; bun 24; creat 0.97; k+ 4.2; na++ 143; ca 8.6; liver normal albumin 3.2; mag 1.7; phos 2.2; hgb a1c 5.6  05-04-17: wbc 14.2; hgb 14.1; hct 42.7; mcv 95.2; plt 214; glucose 275; bun 18.5; creat 0.86; k+ 4.4; an++ 140; ca 9.4   NO NEW LABS.    Review of Systems  Unable to  perform ROS: Dementia (nonverbal )    Physical Exam  Constitutional: No distress.  Frail   Neck: No thyromegaly present.  Cardiovascular: Normal rate, regular rhythm, normal heart sounds and intact distal pulses.  Pulmonary/Chest: Effort normal and breath sounds normal. No respiratory distress.  Abdominal: Soft. Bowel sounds are normal. She exhibits no distension. There is no tenderness.  Musculoskeletal: She exhibits no edema.  Able to move all extremities   Lymphadenopathy:    She has no cervical adenopathy.  Neurological:  Is aware   Skin: Skin is warm and dry. She is not diaphoretic.     ASSESSMENT/ PLAN:  TODAY:   1.  Diabetes type II non insulin dependent: cbgs are all elevated  hgb a1c 5.6; will continue metformin xr 500 mg daily and tradjenta 5 mg daily will increase lantus to 15 units nightly and will begin novolog 3 units with meals. Will get urin for micro-albumin    MD is aware of resident's narcotic use and is in agreement with current plan of care. We will attempt to wean resident as apropriate   Synthia Innocent NP [facility] Adult [facility]  Contact [phone] Monday through Friday 8am- 5pm  After hours call [phone]

## 2017-05-22 ENCOUNTER — Encounter: Payer: Self-pay | Admitting: Adult Health

## 2017-05-22 ENCOUNTER — Non-Acute Institutional Stay (SKILLED_NURSING_FACILITY): Payer: Medicare Other | Admitting: Adult Health

## 2017-05-22 DIAGNOSIS — IMO0002 Reserved for concepts with insufficient information to code with codable children: Secondary | ICD-10-CM

## 2017-05-22 DIAGNOSIS — I10 Essential (primary) hypertension: Secondary | ICD-10-CM

## 2017-05-22 DIAGNOSIS — E1165 Type 2 diabetes mellitus with hyperglycemia: Secondary | ICD-10-CM | POA: Diagnosis not present

## 2017-05-22 DIAGNOSIS — Z794 Long term (current) use of insulin: Secondary | ICD-10-CM | POA: Diagnosis not present

## 2017-05-22 DIAGNOSIS — M810 Age-related osteoporosis without current pathological fracture: Secondary | ICD-10-CM | POA: Diagnosis not present

## 2017-05-22 NOTE — Progress Notes (Signed)
Location:   [facility] Room Number: 111 A Place of Service:  SNF (31)   CODE STATUS: Full Code  Allergies  Allergen Reactions  . Losartan Swelling    angioedema    Chief Complaint  Patient presents with  . Medical Management of Chronic Issues    Hypertension; diabetes; osteoporosis; weekly follow up for the first 30 days post hospitalization     HPI:  She is a 77 year old long term resident of this facility being seen for the management of her chronic illnesses: hypertension; diabetes; osteoporosis. She is unable to participate in the hpi or ros. There are no reports of excessive thirst; excessive hunger; no reports of uncontrolled pain. There are no nursing concerns at this time.   Past Medical History:  Diagnosis Date  . 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 Anxiety   . Blepharitis   . Dementia   . Depression   . Diabetes mellitus   . Essential hypertension 01/12/2009   Qualifier: Diagnosis of  By: Alvester Morin MD, Viviann Spare    . GERD (gastroesophageal reflux disease)   . Heart murmur, systolic   . Hepatitis   . Hyperlipidemia   . Hypertension   . Osteoporosis   . Urinary frequency     Past Surgical History:  Procedure Laterality Date  . COLONOSCOPY N/A 02/16/2012   Procedure: COLONOSCOPY;  Surgeon: Vertell Novak., MD;  Location: [facility] ENDOSCOPY;  Service: Endoscopy;  Laterality: N/A;  . ESOPHAGOGASTRODUODENOSCOPY N/A 02/16/2012   Procedure: ESOPHAGOGASTRODUODENOSCOPY (EGD);  Surgeon: Vertell Novak., MD;  Location: [facility] ENDOSCOPY;  Service: Endoscopy;  Laterality: N/A;  . THYROIDECTOMY     in 2000    Social History   Socioeconomic History  . Marital status: Widowed    Spouse name: Not on file  . Number of children: Not on file  . Years of education: 49  . Highest education level: Not on file  Occupational History  . Not on file  Social Needs  . Financial resource strain: Not on file  . Food insecurity:    Worry: Not on file   Inability: Not on file  . Transportation needs:    Medical: Not on file    Non-medical: Not on file  Tobacco Use  . Smoking status: Never Smoker  . Smokeless tobacco: Never Used  Substance and Sexual Activity  . Alcohol use: No    Alcohol/week: 0.0 oz  . Drug use: No  . Sexual activity: Never  Lifestyle  . Physical activity:    Days per week: Not on file    Minutes per session: Not on file  . Stress: Not on file  Relationships  . Social connections:    Talks on phone: Not on file    Gets together: Not on file    Attends religious service: Not on file    Active member of club or organization: Not on file    Attends meetings of clubs or organizations: Not on file    Relationship status: Not on file  . Intimate partner violence:    Fear of current or ex partner: Not on file    Emotionally abused: Not on file    Physically abused: Not on file    Forced sexual activity: Not on file  Other Topics Concern  . Not on file  Social History Narrative   Lives with daughter and grand-daughter to help informally supervise pt at home.   Pt also in adult daycare during the week.  Family History  Problem Relation Age of Onset  . Cancer Sister        brain  . Diabetes Son   . Hypertension Son   . Diabetes Daughter       VITAL SIGNS BP 120/80   Pulse 80   Temp 98.1 F (36.7 C)   Resp 16   Ht  (1.549 m)   Wt 130 lb (59 kg)   SpO2 98%   BMI 24.56 kg/m   Outpatient Encounter Medications as of 05/22/2017  Medication Sig  . alendronate (FOSAMAX) 70 MG tablet Take 70 mg by mouth every Tuesday. Take with a full glass of water on an empty stomach.   Marland Kitchen amLODipine (NORVASC) 5 MG tablet Take 5 mg by mouth daily.  . calcium-vitamin D (OSCAL WITH D) 500-200 MG-UNIT per tablet Take 1 tablet by mouth daily.  . cetirizine (ZYRTEC) 10 MG tablet Take 0.5 tablets (5 mg total) by mouth daily.  Marland Kitchen donepezil (ARICEPT) 5 MG tablet Take 1 tablet (5 mg total) by mouth at bedtime.  .  hydrALAZINE (APRESOLINE) 10 MG tablet Take 10 mg by mouth 3 (three) times daily.  . insulin aspart (NOVOLOG FLEXPEN) 100 UNIT/ML FlexPen Inject 3 Units into the skin 3 (three) times daily with meals.  . Insulin Glargine (LANTUS SOLOSTAR) 100 UNIT/ML Solostar Pen Inject 15 Units into the skin daily at 10 pm.   . linagliptin (TRADJENTA) 5 MG TABS tablet Take 5 mg by mouth daily.  . memantine (NAMENDA XR) 28 MG CP24 24 hr capsule Take 28 mg by mouth daily.  . metFORMIN (GLUCOPHAGE-XR) 500 MG 24 hr tablet Take 500 mg by mouth every evening.  . metoprolol succinate (TOPROL-XL) 25 MG 24 hr tablet Take 25 mg by mouth daily.  . mirtazapine (REMERON) 15 MG tablet Take 15 mg by mouth at bedtime.  . Nutritional Supplements (NUTRITIONAL SUPPLEMENT PO) Low Concentrated Sweets diet - Regular texture, NAS  . omeprazole (PRILOSEC) 40 MG capsule Take 1 capsule (40 mg total) by mouth daily.   No facility-administered encounter medications on file as of 05/22/2017.      SIGNIFICANT DIAGNOSTIC EXAMS   PREVIOUS:  04-24-17: chest x-ray: No active cardiopulmonary disease.   04-24-17: ct of head: Atrophy and small vessel disease, progressive since 2007. No acute intracranial findings.  04-24-17: ct of abdomen and pelvis: 1. Mild diffuse small bowel distention without mechanical bowel obstruction compatible with a mild small bowel enteritis. 2. Coronary arteriosclerosis. 3. Punctate nonobstructing right lower pole renal calculus. No hydroureteronephrosis. 4. Fibroid uterus.  NO NEW EXAMS    LABS REVIEWED; PEVIOUS  04-24-17: wbc 9.5; hgb 15.1; hct 45.0; mcv 247; plt 247 glucose 177 bun 30; creat 1.04; k+ 4.7; na++ 145; ca 9.7; liver normal albumin 4.0; urine culture no growth 04-25-17: wbc 7.3; hgb 13.3; hct 40.0; mcv 95.7; plt 194; glucose 105; bun 24; creat 0.97; k+ 4.2; na++ 143; ca 8.6; liver normal albumin 3.2; mag 1.7; phos 2.2; hgb a1c 5.6  05-04-17: wbc 14.2; hgb 14.1; hct 42.7; mcv 95.2; plt 214; glucose  275; bun 18.5; creat 0.86; k+ 4.4; an++ 140; ca 9.4   NO NEW LABS.    Review of Systems  Unable to perform ROS: Dementia (nonverbal )    Physical Exam  Constitutional: No distress.  Frail   Neck: No thyromegaly present.  Cardiovascular: Normal rate, regular rhythm, normal heart sounds and intact distal pulses.  Pulmonary/Chest: Effort normal and breath sounds normal. No respiratory distress.  Abdominal:  Soft. Bowel sounds are normal. She exhibits no distension. There is no tenderness.  Musculoskeletal: She exhibits no edema.  To move all extremities   Lymphadenopathy:    She has no cervical adenopathy.  Neurological:  Is aware   Skin: Skin is warm and dry. She is not diaphoretic.     ASSESSMENT/ PLAN:  TODAY:   1. Osteoporosis, age related: stable will continue fosamax 70 mg weekly and is on calcium supplement.   2. Essential hypertension: stable b/p 120/80: will continue norvasc 5 mg daily; toprol xl 25 mg daily  and apresoline 10 mg three times daily   3. Insulin dependent type 2 diabetes mellitus uncontrolled: cbgs remain elevated; medications recently adjusted  hgb a1c 5.6; will continue metformin xr 500 mg daily tradjenta 5 mg daily lantus 15 units nightly and novolog 3 units after meals.   PREVIOUS  4. Dementia, alzheimer's with behavior disturbance; without change weight is 130 pounds; will continue aricept 5 mg daily and namenda xr 28 mg daily   5. Depression: stable will continue remeron 15 mg nightly   6. gerd without esophagitis: stable will continue prilosec 40 mg daily       MD is aware of resident's narcotic use and is in agreement with current plan of care. We will attempt to wean resident as apropriate   Synthia Innocent NP [facility]  Contact [phone] Monday through Friday 8am- 5pm  After hours call [phone]

## 2017-05-25 ENCOUNTER — Non-Acute Institutional Stay (SKILLED_NURSING_FACILITY): Payer: Medicare Other | Admitting: Adult Health

## 2017-05-25 ENCOUNTER — Encounter: Payer: Self-pay | Admitting: Adult Health

## 2017-05-25 DIAGNOSIS — Z794 Long term (current) use of insulin: Secondary | ICD-10-CM | POA: Diagnosis not present

## 2017-05-25 DIAGNOSIS — E1165 Type 2 diabetes mellitus with hyperglycemia: Secondary | ICD-10-CM

## 2017-05-25 DIAGNOSIS — IMO0002 Reserved for concepts with insufficient information to code with codable children: Secondary | ICD-10-CM

## 2017-05-25 NOTE — Progress Notes (Signed)
Location:   [facility] Room Number: 111 A Place of Service:  SNF (31)   CODE STATUS: Full Code  Allergies  Allergen Reactions  . Losartan Swelling    angioedema   Chief Complaint  Patient presents with  . Acute Visit    diabetes      HPI:  Her cbg readings are stable. There are no reports of hypoglycemia present. No reports of excessive hunger or thirst present. She is unable to participate in the hpi or ros. There are no reports of missed doses; she is tolerating her medications without difficulty.   Past Medical History:  Diagnosis Date  . 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 Anxiety   . Blepharitis   . Dementia   . Depression   . Diabetes mellitus   . Essential hypertension 01/12/2009   Qualifier: Diagnosis of  By: Alvester Morin MD, Viviann Spare    . GERD (gastroesophageal reflux disease)   . Heart murmur, systolic   . Hepatitis   . Hyperlipidemia   . Hypertension   . Osteoporosis   . Urinary frequency     Past Surgical History:  Procedure Laterality Date  . COLONOSCOPY N/A 02/16/2012   Procedure: COLONOSCOPY;  Surgeon: Vertell Novak., MD;  Location: [facility] ENDOSCOPY;  Service: Endoscopy;  Laterality: N/A;  . ESOPHAGOGASTRODUODENOSCOPY N/A 02/16/2012   Procedure: ESOPHAGOGASTRODUODENOSCOPY (EGD);  Surgeon: Vertell Novak., MD;  Location: [facility] ENDOSCOPY;  Service: Endoscopy;  Laterality: N/A;  . THYROIDECTOMY     in 2000    Social History   Socioeconomic History  . Marital status: Widowed    Spouse name: Not on file  . Number of children: Not on file  . Years of education: 42  . Highest education level: Not on file  Occupational History  . Not on file  Social Needs  . Financial resource strain: Not on file  . Food insecurity:    Worry: Not on file    Inability: Not on file  . Transportation needs:    Medical: Not on file    Non-medical: Not on file  Tobacco Use  . Smoking status: Never Smoker  . Smokeless tobacco: Never Used    Substance and Sexual Activity  . Alcohol use: No    Alcohol/week: 0.0 oz  . Drug use: No  . Sexual activity: Never  Lifestyle  . Physical activity:    Days per week: Not on file    Minutes per session: Not on file  . Stress: Not on file  Relationships  . Social connections:    Talks on phone: Not on file    Gets together: Not on file    Attends religious service: Not on file    Active member of club or organization: Not on file    Attends meetings of clubs or organizations: Not on file    Relationship status: Not on file  . Intimate partner violence:    Fear of current or ex partner: Not on file    Emotionally abused: Not on file    Physically abused: Not on file    Forced sexual activity: Not on file  Other Topics Concern  . Not on file  Social History Narrative   Lives with daughter and grand-daughter to help informally supervise pt at home.   Pt also in adult daycare during the week.     Family History  Problem Relation Age of Onset  . Cancer Sister        brain  .  Diabetes Son   . Hypertension Son   . Diabetes Daughter       VITAL SIGNS There were no vitals taken for this visit.  Outpatient Encounter Medications as of 05/25/2017  Medication Sig  . alendronate (FOSAMAX) 70 MG tablet Take 70 mg by mouth every Tuesday. Take with a full glass of water on an empty stomach.   Marland Kitchen amLODipine (NORVASC) 5 MG tablet Take 5 mg by mouth daily.  . calcium-vitamin D (OSCAL WITH D) 500-200 MG-UNIT per tablet Take 1 tablet by mouth daily.  Marland Kitchen donepezil (ARICEPT) 5 MG tablet Take 1 tablet (5 mg total) by mouth at bedtime.  . hydrALAZINE (APRESOLINE) 10 MG tablet Take 10 mg by mouth 3 (three) times daily.  . insulin aspart (NOVOLOG FLEXPEN) 100 UNIT/ML FlexPen Inject 3 Units into the skin 3 (three) times daily with meals.  . Insulin Glargine (LANTUS SOLOSTAR) 100 UNIT/ML Solostar Pen Inject 15 Units into the skin daily at 10 pm.   . linagliptin (TRADJENTA) 5 MG TABS tablet Take 5  mg by mouth daily.  . memantine (NAMENDA XR) 28 MG CP24 24 hr capsule Take 28 mg by mouth daily.  . metFORMIN (GLUCOPHAGE-XR) 500 MG 24 hr tablet Take 500 mg by mouth every evening.  . metoprolol succinate (TOPROL-XL) 25 MG 24 hr tablet Take 25 mg by mouth daily.  . mirtazapine (REMERON) 15 MG tablet Take 15 mg by mouth at bedtime.  . Nutritional Supplements (NEPRO) LIQD Give by mouth two times daily for supplement  . Nutritional Supplements (NUTRITIONAL SUPPLEMENT PO) Low Concentrated Sweets diet - Regular texture, NAS  . omeprazole (PRILOSEC) 40 MG capsule Take 1 capsule (40 mg total) by mouth daily.  . [DISCONTINUED] aspirin EC 81 MG tablet Take 81 mg by mouth daily.  . [DISCONTINUED] cetirizine (ZYRTEC) 10 MG tablet Take 0.5 tablets (5 mg total) by mouth daily. (Patient not taking: Reported on 05/25/2017)   No facility-administered encounter medications on file as of 05/25/2017.      SIGNIFICANT DIAGNOSTIC EXAMS  PREVIOUS:  04-24-17: chest x-ray: No active cardiopulmonary disease.   04-24-17: ct of head: Atrophy and small vessel disease, progressive since 2007. No acute intracranial findings.  04-24-17: ct of abdomen and pelvis: 1. Mild diffuse small bowel distention without mechanical bowel obstruction compatible with a mild small bowel enteritis. 2. Coronary arteriosclerosis. 3. Punctate nonobstructing right lower pole renal calculus. No hydroureteronephrosis. 4. Fibroid uterus.  NO NEW EXAMS    LABS REVIEWED; PEVIOUS  04-24-17: wbc 9.5; hgb 15.1; hct 45.0; mcv 247; plt 247 glucose 177 bun 30; creat 1.04; k+ 4.7; na++ 145; ca 9.7; liver normal albumin 4.0; urine culture no growth 04-25-17: wbc 7.3; hgb 13.3; hct 40.0; mcv 95.7; plt 194; glucose 105; bun 24; creat 0.97; k+ 4.2; na++ 143; ca 8.6; liver normal albumin 3.2; mag 1.7; phos 2.2; hgb a1c 5.6  05-04-17: wbc 14.2; hgb 14.1; hct 42.7; mcv 95.2; plt 214; glucose 275; bun 18.5; creat 0.86; k+ 4.4; an++ 140; ca 9.4   NO NEW LABS.     Review of Systems  Unable to perform ROS: Dementia (nonverbal )   Physical Exam  Constitutional: No distress.  Frail   Neck: No thyromegaly present.  Cardiovascular: Normal rate, regular rhythm, normal heart sounds and intact distal pulses.  Pulmonary/Chest: Effort normal and breath sounds normal. No respiratory distress.  Abdominal: Soft. Bowel sounds are normal. She exhibits no distension. There is no tenderness.  Musculoskeletal: She exhibits no edema.  Able to  move all extremities   Lymphadenopathy:    She has no cervical adenopathy.  Neurological:  Is aware   Skin: Skin is warm and dry. She is not diaphoretic.  Psychiatric: She has a normal mood and affect.      ASSESSMENT/ PLAN:  TODAY:   1.Insulin dependent type 2 diabetes mellitus uncontrolled: cbgs are stable ; medications recently adjusted  hgb a1c 5.6; will continue metformin xr 500 mg daily tradjenta 5 mg daily lantus 15 units nightly and novolog 3 units after meals.     MD is aware of resident's narcotic use and is in agreement with current plan of care. We will attempt to wean resident as apropriate   Synthia Innocent NP [facility]  Contact [phone] Monday through Friday 8am- 5pm  After hours call [phone]

## 2017-05-31 ENCOUNTER — Non-Acute Institutional Stay (SKILLED_NURSING_FACILITY): Payer: Medicare Other | Admitting: Adult Health

## 2017-05-31 ENCOUNTER — Encounter: Payer: Self-pay | Admitting: Adult Health

## 2017-05-31 DIAGNOSIS — K219 Gastro-esophageal reflux disease without esophagitis: Secondary | ICD-10-CM | POA: Diagnosis not present

## 2017-05-31 DIAGNOSIS — F0281 Dementia in other diseases classified elsewhere with behavioral disturbance: Secondary | ICD-10-CM

## 2017-05-31 DIAGNOSIS — F324 Major depressive disorder, single episode, in partial remission: Secondary | ICD-10-CM | POA: Diagnosis not present

## 2017-05-31 DIAGNOSIS — G309 Alzheimer's disease, unspecified: Secondary | ICD-10-CM

## 2017-05-31 NOTE — Progress Notes (Signed)
Location:   [facility] Room Number: 111 A Place of Service:  SNF (31)   CODE STATUS: Full Code  Allergies  Allergen Reactions  . Losartan Swelling    angioedema    Chief Complaint  Patient presents with  . Medical Management of Chronic Issues    Gerd; dementia; depression.     HPI:  She is a 77 year old long term resident of this facility being seen for the management of her chronic illnesses: gerd; dementia; depression. She is unable to participate in the hpi or ros. There are no reports of anxiety; no GI distress; no change in appetite. There are no nursing concerns at this time.   Past Medical History:  Diagnosis Date  . 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 Anxiety   . Blepharitis   . Dementia   . Depression   . Diabetes mellitus   . Essential hypertension 01/12/2009   Qualifier: Diagnosis of  By: Alvester Morin MD, Viviann Spare    . GERD (gastroesophageal reflux disease)   . Heart murmur, systolic   . Hepatitis   . Hyperlipidemia   . Hypertension   . Osteoporosis   . Urinary frequency     Past Surgical History:  Procedure Laterality Date  . COLONOSCOPY N/A 02/16/2012   Procedure: COLONOSCOPY;  Surgeon: Vertell Novak., MD;  Location: [facility] ENDOSCOPY;  Service: Endoscopy;  Laterality: N/A;  . ESOPHAGOGASTRODUODENOSCOPY N/A 02/16/2012   Procedure: ESOPHAGOGASTRODUODENOSCOPY (EGD);  Surgeon: Vertell Novak., MD;  Location: [facility] ENDOSCOPY;  Service: Endoscopy;  Laterality: N/A;  . THYROIDECTOMY     in 2000    Social History   Socioeconomic History  . Marital status: Widowed    Spouse name: Not on file  . Number of children: Not on file  . Years of education: 35  . Highest education level: Not on file  Occupational History  . Not on file  Social Needs  . Financial resource strain: Not on file  . Food insecurity:    Worry: Not on file    Inability: Not on file  . Transportation needs:    Medical: Not on file    Non-medical: Not on  file  Tobacco Use  . Smoking status: Never Smoker  . Smokeless tobacco: Never Used  Substance and Sexual Activity  . Alcohol use: No    Alcohol/week: 0.0 oz  . Drug use: No  . Sexual activity: Never  Lifestyle  . Physical activity:    Days per week: Not on file    Minutes per session: Not on file  . Stress: Not on file  Relationships  . Social connections:    Talks on phone: Not on file    Gets together: Not on file    Attends religious service: Not on file    Active member of club or organization: Not on file    Attends meetings of clubs or organizations: Not on file    Relationship status: Not on file  . Intimate partner violence:    Fear of current or ex partner: Not on file    Emotionally abused: Not on file    Physically abused: Not on file    Forced sexual activity: Not on file  Other Topics Concern  . Not on file  Social History Narrative   Lives with daughter and grand-daughter to help informally supervise pt at home.   Pt also in adult daycare during the week.     Family History  Problem Relation  Age of Onset  . Cancer Sister        brain  . Diabetes Son   . Hypertension Son   . Diabetes Daughter       VITAL SIGNS BP 136/76   Pulse 88   Temp 97.8 F (36.6 C)   Resp 18   Ht  (1.549 m)   Wt 130 lb (59 kg)   SpO2 97%   BMI 24.56 kg/m   Outpatient Encounter Medications as of 05/31/2017  Medication Sig  . alendronate (FOSAMAX) 70 MG tablet Take 70 mg by mouth every Tuesday. Take with a full glass of water on an empty stomach.   Marland Kitchen amLODipine (NORVASC) 5 MG tablet Take 5 mg by mouth daily.  . calcium-vitamin D (OSCAL WITH D) 500-200 MG-UNIT per tablet Take 1 tablet by mouth daily.  Marland Kitchen donepezil (ARICEPT) 5 MG tablet Take 1 tablet (5 mg total) by mouth at bedtime.  . hydrALAZINE (APRESOLINE) 10 MG tablet Take 10 mg by mouth 3 (three) times daily.  . insulin aspart (NOVOLOG FLEXPEN) 100 UNIT/ML FlexPen Inject 3 Units into the skin 3 (three) times  daily with meals.  . Insulin Glargine (LANTUS SOLOSTAR) 100 UNIT/ML Solostar Pen Inject 15 Units into the skin daily at 10 pm.   . linagliptin (TRADJENTA) 5 MG TABS tablet Take 5 mg by mouth daily.  . memantine (NAMENDA XR) 28 MG CP24 24 hr capsule Take 28 mg by mouth daily.  . metFORMIN (GLUCOPHAGE-XR) 500 MG 24 hr tablet Take 500 mg by mouth every evening.  . metoprolol succinate (TOPROL-XL) 25 MG 24 hr tablet Take 25 mg by mouth daily.  . mirtazapine (REMERON) 15 MG tablet Take 15 mg by mouth at bedtime.  . Multiple Vitamin (MULTIVITAMIN) tablet Take 1 tablet by mouth daily.  . Nutritional Supplements (NUTRITIONAL SUPPLEMENT PO) Low Concentrated Sweets diet - Regular texture, NAS  . Nutritional Supplements (PROMOD) LIQD Take 30 mLs by mouth daily.  Marland Kitchen omeprazole (PRILOSEC) 40 MG capsule Take 1 capsule (40 mg total) by mouth daily.  . [DISCONTINUED] Nutritional Supplements (NEPRO) LIQD Take 30 mLs by mouth daily.    No facility-administered encounter medications on file as of 05/31/2017.      SIGNIFICANT DIAGNOSTIC EXAMS  PREVIOUS:  04-24-17: chest x-ray: No active cardiopulmonary disease.   04-24-17: ct of head: Atrophy and small vessel disease, progressive since 2007. No acute intracranial findings.  04-24-17: ct of abdomen and pelvis: 1. Mild diffuse small bowel distention without mechanical bowel obstruction compatible with a mild small bowel enteritis. 2. Coronary arteriosclerosis. 3. Punctate nonobstructing right lower pole renal calculus. No hydroureteronephrosis. 4. Fibroid uterus.  NO NEW EXAMS    LABS REVIEWED; PEVIOUS  04-24-17: wbc 9.5; hgb 15.1; hct 45.0; mcv 247; plt 247 glucose 177 bun 30; creat 1.04; k+ 4.7; na++ 145; ca 9.7; liver normal albumin 4.0; urine culture no growth 04-25-17: wbc 7.3; hgb 13.3; hct 40.0; mcv 95.7; plt 194; glucose 105; bun 24; creat 0.97; k+ 4.2; na++ 143; ca 8.6; liver normal albumin 3.2; mag 1.7; phos 2.2; hgb a1c 5.6  05-04-17: wbc 14.2; hgb  14.1; hct 42.7; mcv 95.2; plt 214; glucose 275; bun 18.5; creat 0.86; k+ 4.4; an++ 140; ca 9.4   NO NEW LABS.    Review of Systems  Unable to perform ROS: Dementia (nonverbal )    Physical Exam  Constitutional: No distress.  Frail   Neck: No thyromegaly present.  Cardiovascular: Normal rate, regular rhythm, normal heart sounds and  intact distal pulses.  Pulmonary/Chest: Effort normal and breath sounds normal. No respiratory distress.  Abdominal: Soft. Bowel sounds are normal. She exhibits no distension. There is no tenderness.  Musculoskeletal: Normal range of motion. She exhibits no edema.  Neurological:  Is aware   Skin: Skin is warm and dry. She is not diaphoretic.  Psychiatric: She has a normal mood and affect.    ASSESSMENT/ PLAN:  TODAY:   1. Dementia, alzheimer's with behavior disturbance; without change weight is 130 pounds; will continue aricept 5 mg daily and namenda xr 28 mg daily   2.  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 gerd without esophagitis: stable will continue prilosec 40 mg daily   PREVIOUS  4. Osteoporosis, age related: stable will continue fosamax 70 mg weekly and is on calcium supplement.   5. Essential hypertension: stable b/p 136/76: will continue norvasc 5 mg daily; toprol xl 25 mg daily  and apresoline 10 mg three times daily   6. Insulin dependent type 2 diabetes mellitus uncontrolled: cbgs remain elevated; medications recently adjusted  hgb a1c 5.6; will continue metformin xr 500 mg daily tradjenta 5 mg daily lantus 15 units nightly and novolog 3 units after meals.     MD is aware of resident's narcotic use and is in agreement with current plan of care. We will attempt to wean resident as apropriate   Synthia Innocent NP [facility]  Contact [phone] Monday through Friday 8am- 5pm  After hours call [phone]

## 2017-06-01 LAB — MICROALBUMIN, URINE: MICROALB UR: 1.2

## 2017-06-28 ENCOUNTER — Encounter: Payer: Self-pay | Admitting: Adult Health

## 2017-06-28 ENCOUNTER — Non-Acute Institutional Stay (SKILLED_NURSING_FACILITY): Payer: Medicare Other | Admitting: Adult Health

## 2017-06-28 DIAGNOSIS — G309 Alzheimer's disease, unspecified: Secondary | ICD-10-CM

## 2017-06-28 DIAGNOSIS — F0281 Dementia in other diseases classified elsewhere with behavioral disturbance: Secondary | ICD-10-CM

## 2017-06-28 DIAGNOSIS — I10 Essential (primary) hypertension: Secondary | ICD-10-CM

## 2017-06-28 DIAGNOSIS — Z794 Long term (current) use of insulin: Secondary | ICD-10-CM | POA: Diagnosis not present

## 2017-06-28 DIAGNOSIS — E1165 Type 2 diabetes mellitus with hyperglycemia: Secondary | ICD-10-CM | POA: Diagnosis not present

## 2017-06-28 DIAGNOSIS — IMO0002 Reserved for concepts with insufficient information to code with codable children: Secondary | ICD-10-CM

## 2017-06-28 NOTE — Progress Notes (Addendum)
Location:   [facility] Room Number: 111 A Place of Service:  SNF (31)   CODE STATUS: Full Code  Allergies  Allergen Reactions  . Losartan Swelling    angioedema    Chief Complaint  Patient presents with  . Acute Visit    Care Plan Meeting    HPI:  We have come together for her care plan meeting. She is unable to participate in the meeting; there is no family present. We have discussed her overall status; her medications; her plan of care. There are no reports of pain; no change in appetite; no indications of anxiety present. There are no nursing concerns at this time.  Her cbg readings are elevated.   Past Medical History:  Diagnosis Date  . 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 Anxiety   . Blepharitis   . Dementia   . Depression   . Diabetes mellitus   . Essential hypertension 01/12/2009   Qualifier: Diagnosis of  By: Alvester Morin MD, Viviann Spare    . GERD (gastroesophageal reflux disease)   . Heart murmur, systolic   . Hepatitis   . Hyperlipidemia   . Hypertension   . Osteoporosis   . Urinary frequency     Past Surgical History:  Procedure Laterality Date  . COLONOSCOPY N/A 02/16/2012   Procedure: COLONOSCOPY;  Surgeon: Vertell Novak., MD;  Location: [facility] ENDOSCOPY;  Service: Endoscopy;  Laterality: N/A;  . ESOPHAGOGASTRODUODENOSCOPY N/A 02/16/2012   Procedure: ESOPHAGOGASTRODUODENOSCOPY (EGD);  Surgeon: Vertell Novak., MD;  Location: [facility] ENDOSCOPY;  Service: Endoscopy;  Laterality: N/A;  . THYROIDECTOMY     in 2000    Social History   Socioeconomic History  . Marital status: Widowed    Spouse name: Not on file  . Number of children: Not on file  . Years of education: 20  . Highest education level: Not on file  Occupational History  . Not on file  Social Needs  . Financial resource strain: Not on file  . Food insecurity:    Worry: Not on file    Inability: Not on file  . Transportation needs:    Medical: Not on file   Non-medical: Not on file  Tobacco Use  . Smoking status: Never Smoker  . Smokeless tobacco: Never Used  Substance and Sexual Activity  . Alcohol use: No    Alcohol/week: 0.0 oz  . Drug use: No  . Sexual activity: Never  Lifestyle  . Physical activity:    Days per week: Not on file    Minutes per session: Not on file  . Stress: Not on file  Relationships  . Social connections:    Talks on phone: Not on file    Gets together: Not on file    Attends religious service: Not on file    Active member of club or organization: Not on file    Attends meetings of clubs or organizations: Not on file    Relationship status: Not on file  . Intimate partner violence:    Fear of current or ex partner: Not on file    Emotionally abused: Not on file    Physically abused: Not on file    Forced sexual activity: Not on file  Other Topics Concern  . Not on file  Social History Narrative   Lives with daughter and grand-daughter to help informally supervise pt at home.   Pt also in adult daycare during the week.     Family History  Problem Relation Age of Onset  . Cancer Sister        brain  . Diabetes Son   . Hypertension Son   . Diabetes Daughter       VITAL SIGNS BP 137/66   Pulse 80   Temp 98.3 F (36.8 C)   Resp 20   Ht 5\' 1"  (1.549 m)   Wt 129 lb 3.2 oz (58.6 kg)   SpO2 98%   BMI 24.41 kg/m   Outpatient Encounter Medications as of 06/28/2017  Medication Sig  . alendronate (FOSAMAX) 70 MG tablet Take 70 mg by mouth every Tuesday. Take with a full glass of water on an empty stomach.   Marland Kitchen. amLODipine (NORVASC) 5 MG tablet Take 5 mg by mouth daily.  . calcium-vitamin D (OSCAL WITH D) 500-200 MG-UNIT per tablet Take 1 tablet by mouth daily.  Marland Kitchen. donepezil (ARICEPT) 5 MG tablet Take 1 tablet (5 mg total) by mouth at bedtime.  . hydrALAZINE (APRESOLINE) 10 MG tablet Take 10 mg by mouth 3 (three) times daily.  . insulin aspart (NOVOLOG FLEXPEN) 100 UNIT/ML FlexPen Inject 3 Units into  the skin 3 (three) times daily with meals.  . Insulin Glargine (LANTUS SOLOSTAR) 100 UNIT/ML Solostar Pen Inject 15 Units into the skin daily at 10 pm.   . linagliptin (TRADJENTA) 5 MG TABS tablet Take 5 mg by mouth daily.  . memantine (NAMENDA XR) 28 MG CP24 24 hr capsule Take 28 mg by mouth daily.  . metFORMIN (GLUCOPHAGE-XR) 500 MG 24 hr tablet Take 500 mg by mouth every evening.  . metoprolol succinate (TOPROL-XL) 25 MG 24 hr tablet Take 25 mg by mouth daily.  . mirtazapine (REMERON) 15 MG tablet Take 15 mg by mouth at bedtime.  . Multiple Vitamin (MULTIVITAMIN) tablet Take 1 tablet by mouth daily.  . Nutritional Supplements (NUTRITIONAL SUPPLEMENT PO) Low Concentrated Sweets diet - Regular texture, NAS  . Nutritional Supplements (PROMOD) LIQD Take 30 mLs by mouth daily.  Marland Kitchen. omeprazole (PRILOSEC) 40 MG capsule Take 1 capsule (40 mg total) by mouth daily.   No facility-administered encounter medications on file as of 06/28/2017.      SIGNIFICANT DIAGNOSTIC EXAMS  PREVIOUS:  04-24-17: chest x-ray: No active cardiopulmonary disease.   04-24-17: ct of head: Atrophy and small vessel disease, progressive since 2007. No acute intracranial findings.  04-24-17: ct of abdomen and pelvis: 1. Mild diffuse small bowel distention without mechanical bowel obstruction compatible with a mild small bowel enteritis. 2. Coronary arteriosclerosis. 3. Punctate nonobstructing right lower pole renal calculus. No hydroureteronephrosis. 4. Fibroid uterus.  NO NEW EXAMS    LABS REVIEWED; PEVIOUS  04-24-17: wbc 9.5; hgb 15.1; hct 45.0; mcv 247; plt 247 glucose 177 bun 30; creat 1.04; k+ 4.7; na++ 145; ca 9.7; liver normal albumin 4.0; urine culture no growth 04-25-17: wbc 7.3; hgb 13.3; hct 40.0; mcv 95.7; plt 194; glucose 105; bun 24; creat 0.97; k+ 4.2; na++ 143; ca 8.6; liver normal albumin 3.2; mag 1.7; phos 2.2; hgb a1c 5.6  05-04-17: wbc 14.2; hgb 14.1; hct 42.7; mcv 95.2; plt 214; glucose 275; bun 18.5;  creat 0.86; k+ 4.4; an++ 140; ca 9.4   NO NEW LABS.   Review of Systems  Unable to perform ROS: Dementia (nonverbal )    Physical Exam  Constitutional: No distress.  fragile  Neck: No thyromegaly present.  Cardiovascular: Normal rate, regular rhythm, normal heart sounds and intact distal pulses.  Pulmonary/Chest: Effort normal and breath sounds normal. No  respiratory distress.  Abdominal: Soft. Bowel sounds are normal. She exhibits no distension. There is no tenderness.  Musculoskeletal: Normal range of motion. She exhibits no edema.  Lymphadenopathy:    She has no cervical adenopathy.  Neurological:  Is aware   Skin: Skin is warm and dry. She is not diaphoretic.     ASSESSMENT/ PLAN:  TODAY:   1. Dementia, alzheimer's with behavior disturbance; 2. Essential hypertension:  3. Insulin dependent type 2 diabetes mellitus uncontrolled:   Will increase her lantus to 20 units nightly and 6 units novolog with meals.   will continue her current plan of care Will continue to monitor her status.   Time spent with patient and staff: 30 minutes reviewed medical record; discussed status and medication with care plan team.     MD is aware of resident's narcotic use and is in agreement with current plan of care. We will attempt to wean resident as apropriate   Synthia Innocent NP [facility]  Contact [phone] Monday through Friday 8am- 5pm  After hours call [phone]

## 2017-06-29 LAB — VITAMIN B12: Vitamin B-12: 952

## 2017-06-29 LAB — CBC AND DIFFERENTIAL
HCT: 37 (ref 36–46)
HEMOGLOBIN: 12.2 (ref 12.0–16.0)
NEUTROS ABS: 4
Platelets: 192 (ref 150–399)
WBC: 8.1

## 2017-06-30 ENCOUNTER — Non-Acute Institutional Stay (SKILLED_NURSING_FACILITY): Payer: Medicare Other | Admitting: Adult Health

## 2017-06-30 ENCOUNTER — Encounter: Payer: Self-pay | Admitting: Adult Health

## 2017-06-30 DIAGNOSIS — M810 Age-related osteoporosis without current pathological fracture: Secondary | ICD-10-CM | POA: Diagnosis not present

## 2017-06-30 DIAGNOSIS — Z794 Long term (current) use of insulin: Secondary | ICD-10-CM

## 2017-06-30 DIAGNOSIS — I10 Essential (primary) hypertension: Secondary | ICD-10-CM

## 2017-06-30 DIAGNOSIS — IMO0002 Reserved for concepts with insufficient information to code with codable children: Secondary | ICD-10-CM

## 2017-06-30 DIAGNOSIS — E1165 Type 2 diabetes mellitus with hyperglycemia: Secondary | ICD-10-CM | POA: Diagnosis not present

## 2017-06-30 NOTE — Progress Notes (Signed)
Location:   [facility] Room Number: 111 A Place of Service:  SNF (31)   CODE STATUS: Full Code  Allergies  Allergen Reactions  . Losartan Swelling    angioedema    Chief Complaint  Patient presents with  . Medical Management of Chronic Issues    Hypertension; diabetes; osteoporosis     HPI:  She is a 77 year old long term resident of this facility being seen for the management of her chronic illnesses: hypertension; diabetes; osteoporosis. There are no reports of changes of appetite; no uncontrolled pain; no excessive thirst or hunger. There are no nursing concerns at this time.   Past Medical History:  Diagnosis Date  . 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 Anxiety   . Blepharitis   . Dementia   . Depression   . Diabetes mellitus   . Essential hypertension 01/12/2009   Qualifier: Diagnosis of  By: Alvester Morin MD, Viviann Spare    . GERD (gastroesophageal reflux disease)   . Heart murmur, systolic   . Hepatitis   . Hyperlipidemia   . Hypertension   . Osteoporosis   . Urinary frequency     Past Surgical History:  Procedure Laterality Date  . COLONOSCOPY N/A 02/16/2012   Procedure: COLONOSCOPY;  Surgeon: Vertell Novak., MD;  Location: [facility] ENDOSCOPY;  Service: Endoscopy;  Laterality: N/A;  . ESOPHAGOGASTRODUODENOSCOPY N/A 02/16/2012   Procedure: ESOPHAGOGASTRODUODENOSCOPY (EGD);  Surgeon: Vertell Novak., MD;  Location: [facility] ENDOSCOPY;  Service: Endoscopy;  Laterality: N/A;  . THYROIDECTOMY     in 2000    Social History   Socioeconomic History  . Marital status: Widowed    Spouse name: Not on file  . Number of children: Not on file  . Years of education: 22  . Highest education level: Not on file  Occupational History  . Not on file  Social Needs  . Financial resource strain: Not on file  . Food insecurity:    Worry: Not on file    Inability: Not on file  . Transportation needs:    Medical: Not on file    Non-medical: Not on file    Tobacco Use  . Smoking status: Never Smoker  . Smokeless tobacco: Never Used  Substance and Sexual Activity  . Alcohol use: No    Alcohol/week: 0.0 oz  . Drug use: No  . Sexual activity: Never  Lifestyle  . Physical activity:    Days per week: Not on file    Minutes per session: Not on file  . Stress: Not on file  Relationships  . Social connections:    Talks on phone: Not on file    Gets together: Not on file    Attends religious service: Not on file    Active member of club or organization: Not on file    Attends meetings of clubs or organizations: Not on file    Relationship status: Not on file  . Intimate partner violence:    Fear of current or ex partner: Not on file    Emotionally abused: Not on file    Physically abused: Not on file    Forced sexual activity: Not on file  Other Topics Concern  . Not on file  Social History Narrative   Lives with daughter and grand-daughter to help informally supervise pt at home.   Pt also in adult daycare during the week.     Family History  Problem Relation Age of Onset  . Cancer  Sister        brain  . Diabetes Son   . Hypertension Son   . Diabetes Daughter       VITAL SIGNS BP 137/66   Pulse 80   Temp 98.3 F (36.8 C)   Resp 20   Ht 5\' 1"  (1.549 m)   Wt 129 lb 3.2 oz (58.6 kg)   SpO2 98%   BMI 24.41 kg/m   Outpatient Encounter Medications as of 06/30/2017  Medication Sig  . alendronate (FOSAMAX) 70 MG tablet Take 70 mg by mouth every Tuesday. Take with a full glass of water on an empty stomach.   Marland Kitchen. amLODipine (NORVASC) 5 MG tablet Take 5 mg by mouth daily.  . calcium-vitamin D (OSCAL WITH D) 500-200 MG-UNIT per tablet Take 1 tablet by mouth daily.  Marland Kitchen. donepezil (ARICEPT) 5 MG tablet Take 1 tablet (5 mg total) by mouth at bedtime.  . hydrALAZINE (APRESOLINE) 10 MG tablet Take 10 mg by mouth 3 (three) times daily.  . insulin aspart (NOVOLOG FLEXPEN) 100 UNIT/ML FlexPen Inject 6 Units into the skin 3 (three) times  daily with meals.   . Insulin Glargine (LANTUS SOLOSTAR) 100 UNIT/ML Solostar Pen Inject 20 Units into the skin daily at 10 pm.   . linagliptin (TRADJENTA) 5 MG TABS tablet Take 5 mg by mouth daily.  . memantine (NAMENDA XR) 28 MG CP24 24 hr capsule Take 28 mg by mouth daily.  . metFORMIN (GLUCOPHAGE-XR) 500 MG 24 hr tablet Take 500 mg by mouth every evening.  . metoprolol succinate (TOPROL-XL) 25 MG 24 hr tablet Take 25 mg by mouth daily.  . mirtazapine (REMERON) 15 MG tablet Take 15 mg by mouth at bedtime.  . Multiple Vitamin (MULTIVITAMIN) tablet Take 1 tablet by mouth daily.  . Nutritional Supplements (NUTRITIONAL SUPPLEMENT PO) Low Concentrated Sweets diet - Regular texture, NAS  . omeprazole (PRILOSEC) 40 MG capsule Take 1 capsule (40 mg total) by mouth daily.  . [DISCONTINUED] Nutritional Supplements (PROMOD) LIQD Take 30 mLs by mouth daily.   No facility-administered encounter medications on file as of 06/30/2017.      SIGNIFICANT DIAGNOSTIC EXAMS   PREVIOUS:  04-24-17: chest x-ray: No active cardiopulmonary disease.   04-24-17: ct of head: Atrophy and small vessel disease, progressive since 2007. No acute intracranial findings.  04-24-17: ct of abdomen and pelvis: 1. Mild diffuse small bowel distention without mechanical bowel obstruction compatible with a mild small bowel enteritis. 2. Coronary arteriosclerosis. 3. Punctate nonobstructing right lower pole renal calculus. No hydroureteronephrosis. 4. Fibroid uterus.  NO NEW EXAMS    LABS REVIEWED; PEVIOUS  04-24-17: wbc 9.5; hgb 15.1; hct 45.0; mcv 247; plt 247 glucose 177 bun 30; creat 1.04; k+ 4.7; na++ 145; ca 9.7; liver normal albumin 4.0; urine culture no growth 04-25-17: wbc 7.3; hgb 13.3; hct 40.0; mcv 95.7; plt 194; glucose 105; bun 24; creat 0.97; k+ 4.2; na++ 143; ca 8.6; liver normal albumin 3.2; mag 1.7; phos 2.2; hgb a1c 5.6  05-04-17: wbc 14.2; hgb 14.1; hct 42.7; mcv 95.2; plt 214; glucose 275; bun 18.5; creat  0.86; k+ 4.4; an++ 140; ca 9.4   NO NEW LABS.   Review of Systems  Unable to perform ROS: Dementia (nonverbal )    Physical Exam  Constitutional: No distress.  Frail   Neck: No thyromegaly present.  Cardiovascular: Normal rate, regular rhythm, normal heart sounds and intact distal pulses.  Pulmonary/Chest: Effort normal and breath sounds normal. No respiratory distress.  Abdominal:  Soft. Bowel sounds are normal. She exhibits no distension. There is no tenderness.  Musculoskeletal: Normal range of motion. She exhibits no edema.  Lymphadenopathy:    She has no cervical adenopathy.  Neurological:  Is aware   Skin: Skin is warm and dry. She is not diaphoretic.     ASSESSMENT/ PLAN:  TODAY:   1. Osteoporosis, age related: stable will continue fosamax 70 mg weekly and is on calcium supplement.   2. Essential hypertension: stable b/p 137/66: will continue norvasc 5 mg daily; toprol xl 25 mg daily  and apresoline 10 mg three times daily   3. Insulin dependent type 2 diabetes mellitus uncontrolled: cbgs remain elevated; medications recently adjusted  hgb a1c 5.6; will continue metformin xr 500 mg daily tradjenta 5 mg daily lantus 20 units nightly and novolog 6 units after meals.    PREVIOUS  4. Dementia, alzheimer's with behavior disturbance; without change weight is 129 pounds; will continue aricept 5 mg daily and namenda xr 28 mg daily   5.  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 gerd without esophagitis: stable will continue prilosec 40 mg daily      MD is aware of resident's narcotic use and is in agreement with current plan of care. We will attempt to wean resident as apropriate   Synthia Innocent NP [facility]  Contact [phone] Monday through Friday 8am- 5pm  After hours call [phone]

## 2017-07-07 ENCOUNTER — Encounter: Payer: Self-pay | Admitting: Adult Health

## 2017-07-07 ENCOUNTER — Encounter (HOSPITAL_COMMUNITY): Payer: Self-pay

## 2017-07-07 ENCOUNTER — Non-Acute Institutional Stay (SKILLED_NURSING_FACILITY): Payer: Medicare Other | Admitting: Adult Health

## 2017-07-07 ENCOUNTER — Inpatient Hospital Stay (HOSPITAL_COMMUNITY)
Admission: EM | Admit: 2017-07-07 | Discharge: 2017-07-10 | DRG: 871 | Disposition: A | Discharge status: Skilled Nursing Facility | Payer: Medicare Other | Attending: Internal Medicine | Admitting: Internal Medicine

## 2017-07-07 ENCOUNTER — Emergency Department (HOSPITAL_COMMUNITY): Payer: Medicare Other

## 2017-07-07 ENCOUNTER — Other Ambulatory Visit: Payer: Self-pay

## 2017-07-07 DIAGNOSIS — I10 Essential (primary) hypertension: Secondary | ICD-10-CM | POA: Diagnosis present

## 2017-07-07 DIAGNOSIS — M810 Age-related osteoporosis without current pathological fracture: Secondary | ICD-10-CM | POA: Diagnosis present

## 2017-07-07 DIAGNOSIS — N179 Acute kidney failure, unspecified: Secondary | ICD-10-CM | POA: Diagnosis present

## 2017-07-07 DIAGNOSIS — E785 Hyperlipidemia, unspecified: Secondary | ICD-10-CM | POA: Diagnosis present

## 2017-07-07 DIAGNOSIS — R400 Somnolence: Secondary | ICD-10-CM

## 2017-07-07 DIAGNOSIS — N390 Urinary tract infection, site not specified: Secondary | ICD-10-CM

## 2017-07-07 DIAGNOSIS — G301 Alzheimer's disease with late onset: Secondary | ICD-10-CM | POA: Diagnosis not present

## 2017-07-07 DIAGNOSIS — G309 Alzheimer's disease, unspecified: Secondary | ICD-10-CM | POA: Diagnosis present

## 2017-07-07 DIAGNOSIS — A419 Sepsis, unspecified organism: Principal | ICD-10-CM

## 2017-07-07 DIAGNOSIS — R404 Transient alteration of awareness: Secondary | ICD-10-CM

## 2017-07-07 DIAGNOSIS — N10 Acute tubulo-interstitial nephritis: Secondary | ICD-10-CM | POA: Diagnosis present

## 2017-07-07 DIAGNOSIS — E1165 Type 2 diabetes mellitus with hyperglycemia: Secondary | ICD-10-CM

## 2017-07-07 DIAGNOSIS — F329 Major depressive disorder, single episode, unspecified: Secondary | ICD-10-CM | POA: Diagnosis present

## 2017-07-07 DIAGNOSIS — F419 Anxiety disorder, unspecified: Secondary | ICD-10-CM | POA: Diagnosis present

## 2017-07-07 DIAGNOSIS — K219 Gastro-esophageal reflux disease without esophagitis: Secondary | ICD-10-CM | POA: Diagnosis present

## 2017-07-07 DIAGNOSIS — E119 Type 2 diabetes mellitus without complications: Secondary | ICD-10-CM | POA: Diagnosis present

## 2017-07-07 DIAGNOSIS — E870 Hyperosmolality and hypernatremia: Secondary | ICD-10-CM

## 2017-07-07 DIAGNOSIS — Z79899 Other long term (current) drug therapy: Secondary | ICD-10-CM | POA: Diagnosis not present

## 2017-07-07 DIAGNOSIS — R4182 Altered mental status, unspecified: Secondary | ICD-10-CM | POA: Insufficient documentation

## 2017-07-07 DIAGNOSIS — G92 Toxic encephalopathy: Secondary | ICD-10-CM | POA: Diagnosis present

## 2017-07-07 DIAGNOSIS — N170 Acute kidney failure with tubular necrosis: Secondary | ICD-10-CM | POA: Diagnosis not present

## 2017-07-07 DIAGNOSIS — Z794 Long term (current) use of insulin: Secondary | ICD-10-CM

## 2017-07-07 DIAGNOSIS — F0281 Dementia in other diseases classified elsewhere with behavioral disturbance: Secondary | ICD-10-CM

## 2017-07-07 DIAGNOSIS — E860 Dehydration: Secondary | ICD-10-CM | POA: Diagnosis not present

## 2017-07-07 DIAGNOSIS — IMO0002 Reserved for concepts with insufficient information to code with codable children: Secondary | ICD-10-CM

## 2017-07-07 DIAGNOSIS — F02818 Dementia in other diseases classified elsewhere, unspecified severity, with other behavioral disturbance: Secondary | ICD-10-CM

## 2017-07-07 LAB — URINALYSIS, ROUTINE W REFLEX MICROSCOPIC
Bilirubin Urine: NEGATIVE
Glucose, UA: >=500 mg/dL — AB
Ketones, ur: NEGATIVE mg/dL
Nitrite: NEGATIVE
PROTEIN: NEGATIVE mg/dL
SPECIFIC GRAVITY, URINE: 1.021 (ref 1.005–1.030)
pH: 5 (ref 5.0–8.0)

## 2017-07-07 LAB — COMPREHENSIVE METABOLIC PANEL
ALBUMIN: 3.5 g/dL (ref 3.5–5.0)
ALK PHOS: 104 U/L (ref 38–126)
ALT: 30 U/L (ref 0–44)
ANION GAP: 13 (ref 5–15)
AST: 35 U/L (ref 15–41)
BILIRUBIN TOTAL: 1.3 mg/dL — AB (ref 0.3–1.2)
BUN: 38 mg/dL — ABNORMAL HIGH (ref 8–23)
CO2: 28 mmol/L (ref 22–32)
Calcium: 9.8 mg/dL (ref 8.9–10.3)
Chloride: 117 mmol/L — ABNORMAL HIGH (ref 98–111)
Creatinine, Ser: 1.19 mg/dL — ABNORMAL HIGH (ref 0.44–1.00)
GFR calc Af Amer: 49 mL/min — ABNORMAL LOW (ref >60)
GFR calc non Af Amer: 43 mL/min — ABNORMAL LOW (ref >60)
GLUCOSE: 290 mg/dL — AB (ref 70–99)
Potassium: 4.1 mmol/L (ref 3.5–5.1)
Sodium: 158 mmol/L — ABNORMAL HIGH (ref 135–145)
TOTAL PROTEIN: 8.1 g/dL (ref 6.5–8.1)

## 2017-07-07 LAB — CBC WITH DIFFERENTIAL/PLATELET
Basophils Absolute: 0 10*3/uL (ref 0.0–0.1)
Basophils Relative: 0 %
EOS ABS: 0.2 10*3/uL (ref 0.0–0.7)
Eosinophils Relative: 1 %
HCT: 44.9 % (ref 36.0–46.0)
Hemoglobin: 14.2 g/dL (ref 12.0–15.0)
LYMPHS ABS: 4.8 10*3/uL — AB (ref 0.7–4.0)
Lymphocytes Relative: 29 %
MCH: 31.8 pg (ref 26.0–34.0)
MCHC: 31.6 g/dL (ref 30.0–36.0)
MCV: 100.7 fL — AB (ref 78.0–100.0)
MONO ABS: 1.3 10*3/uL — AB (ref 0.1–1.0)
MONOS PCT: 8 %
Neutro Abs: 10.5 10*3/uL — ABNORMAL HIGH (ref 1.7–7.7)
Neutrophils Relative %: 62 %
PLATELETS: 258 10*3/uL (ref 150–400)
RBC: 4.46 MIL/uL (ref 3.87–5.11)
RDW: 13.1 % (ref 11.5–15.5)
WBC: 16.8 10*3/uL — AB (ref 4.0–10.5)

## 2017-07-07 LAB — PROTIME-INR
INR: 1.04
PROTHROMBIN TIME: 13.5 s (ref 11.4–15.2)

## 2017-07-07 LAB — I-STAT CG4 LACTIC ACID, ED
LACTIC ACID, VENOUS: 1.73 mmol/L (ref 0.5–1.9)
Lactic Acid, Venous: 2.01 mmol/L (ref 0.5–1.9)

## 2017-07-07 MED ORDER — CEFEPIME HCL 1 G IJ SOLR
1.0000 g | Freq: Two times a day (BID) | INTRAMUSCULAR | Status: DC
Start: 2017-07-07 — End: 2017-07-08
  Administered 2017-07-08 (×2): 1 g via INTRAVENOUS
  Filled 2017-07-07 (×3): qty 1

## 2017-07-07 MED ORDER — PIPERACILLIN-TAZOBACTAM 3.375 G IVPB 30 MIN
3.3750 g | Freq: Once | INTRAVENOUS | Status: AC
Start: 2017-07-07 — End: 2017-07-07
  Administered 2017-07-07: 3.375 g via INTRAVENOUS
  Filled 2017-07-07: qty 50

## 2017-07-07 MED ORDER — VANCOMYCIN HCL IN DEXTROSE 1-5 GM/200ML-% IV SOLN
1000.0000 mg | Freq: Once | INTRAVENOUS | Status: AC
  Administered 2017-07-07: 1000 mg via INTRAVENOUS
  Filled 2017-07-07: qty 200

## 2017-07-07 MED ORDER — ACETAMINOPHEN 500 MG PO TABS: 1000.0000 mg | ORAL_TABLET | Freq: Once | ORAL | Status: DC

## 2017-07-07 MED ORDER — LACTATED RINGERS IV BOLUS
1000.0000 mL | Freq: Once | INTRAVENOUS | Status: AC
  Administered 2017-07-07: 1000 mL via INTRAVENOUS

## 2017-07-07 MED ORDER — ACETAMINOPHEN 650 MG RE SUPP
650.0000 mg | Freq: Once | RECTAL | Status: AC
  Administered 2017-07-07: 650 mg via RECTAL
  Filled 2017-07-07: qty 1

## 2017-07-07 NOTE — ED Triage Notes (Addendum)
Pt to ed [facility] with c/o of abnormal labs and fever. Pt has a 102.2 temporal tempt with EMS and lab work at bedside shows a WBC of 17.4 from today at 340pm.  Pt is somewhat verbal and facility states patient has been more weak and lethargic for the past 2 days. Pt has hx of dementia, alzheimer with behavioral disturbance. Full code

## 2017-07-07 NOTE — H&P (Signed)
History and Physical    [name]:[id number] DOB: 29-Aug-1940 DOA: 07/07/2017  Referring MD/NP/PA: Dr. Rex Kras  PCP: Harlan Stains, MD   Patient coming from: Skilled nursing facility  Chief Complaint: Fever with altered mental status  HPI: [name] is a 77 y.o. female with medical history significant of advanced dementia, diabetes, hypertension who is a resident of skilled nursing facility brought in secondary to fever up to 102, worsening mental status as well as white count of 17,000.  Patient was seen in the ER and has met sepsis criteria with evidence of UTI.  She is a full code.  She appears chronically ill looking also.  Family at bedside and indicated that patient's baseline is that of dementia with some mild confusion.  ED Course: Temperature is 101.6, blood pressure 100/59, pulse 113, oxygen sats 96% on room air, white count is 17,000, sodium 158, potassium 4.1, chloride 117, BUN 38, creatinine 1.19, glucose 290.  Review of Systems: As per HPI otherwise patient is obtunded not able to give review of systems  Past Medical History:  Diagnosis Date  . 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 Anxiety   . Blepharitis   . Dementia   . Depression   . Diabetes mellitus   . Essential hypertension 01/12/2009   Qualifier: Diagnosis of  By: Ernestina Patches MD, Remo Lipps    . GERD (gastroesophageal reflux disease)   . Heart murmur, systolic   . Hepatitis   . Hyperlipidemia   . Hypertension   . Osteoporosis   . Urinary frequency     Past Surgical History:  Procedure Laterality Date  . COLONOSCOPY N/A 02/16/2012   Procedure: COLONOSCOPY;  Surgeon: Winfield Cunas., MD;  Location: [facility] ENDOSCOPY;  Service: Endoscopy;  Laterality: N/A;  . ESOPHAGOGASTRODUODENOSCOPY N/A 02/16/2012   Procedure: ESOPHAGOGASTRODUODENOSCOPY (EGD);  Surgeon: Winfield Cunas., MD;  Location: [facility] ENDOSCOPY;  Service: Endoscopy;  Laterality: N/A;  . THYROIDECTOMY     in 2000     reports that  she has never smoked. She has never used smokeless tobacco. She reports that she does not drink alcohol or use drugs.  Allergies  Allergen Reactions  . Losartan Swelling    angioedema    Family History  Problem Relation Age of Onset  . Cancer Sister        brain  . Diabetes Son   . Hypertension Son   . Diabetes Daughter     Prior to Admission medications   Medication Sig Start Date End Date Taking? Authorizing Provider  amLODipine (NORVASC) 5 MG tablet Take 5 mg by mouth daily.   Yes [provider]  calcium-vitamin D (OSCAL WITH D) 500-200 MG-UNIT per tablet Take 1 tablet by mouth daily. 02/17/12  Yes Rai, Ripudeep K, MD  donepezil (ARICEPT) 5 MG tablet Take 1 tablet (5 mg total) by mouth at bedtime. 06/06/11  Yes Deneise Lever, MD  hydrALAZINE (APRESOLINE) 10 MG tablet Take 10 mg by mouth 3 (three) times daily.   Yes [provider]  insulin aspart (NOVOLOG FLEXPEN) 100 UNIT/ML FlexPen Inject 6 Units into the skin 3 (three) times daily with meals.  05/18/17  Yes [provider]  Insulin Glargine (LANTUS SOLOSTAR) 100 UNIT/ML Solostar Pen Inject 20 Units into the skin daily at 10 pm.  05/18/17  Yes [provider]  linagliptin (TRADJENTA) 5 MG TABS tablet Take 5 mg by mouth daily.   Yes [provider]  memantine (NAMENDA XR) 28 MG CP24  24 hr capsule Take 28 mg by mouth daily.   Yes [provider]  metFORMIN (GLUCOPHAGE-XR) 500 MG 24 hr tablet Take 500 mg by mouth every evening.   Yes [provider]  metoprolol succinate (TOPROL-XL) 25 MG 24 hr tablet Take 25 mg by mouth daily.   Yes [provider]  mirtazapine (REMERON) 15 MG tablet Take 15 mg by mouth at bedtime.   Yes [provider]  Multiple Vitamin (MULTIVITAMIN) tablet Take 1 tablet by mouth daily.   Yes [provider]  omeprazole (PRILOSEC) 40 MG capsule Take 1 capsule (40 mg total) by mouth daily. 06/06/11  Yes Newton, Steven J, MD    alendronate (FOSAMAX) 70 MG tablet Take 70 mg by mouth every Tuesday. Take with a full glass of water on an empty stomach.     [provider]  Nutritional Supplements (NUTRITIONAL SUPPLEMENT PO) Low Concentrated Sweets diet - Regular texture, NAS    [provider]    Physical Exam: Vitals:   07/07/17 2001 07/07/17 2008 07/07/17 2114 07/07/17 2146  BP: 131/79  (!) 109/56 111/61  Pulse: (!) 113  (!) 106 98  Resp: 14  15 18  Temp: (!) 101.6 F (38.7 C)     TempSrc: Rectal     SpO2: 100%  100% 99%  Weight:  58.5 kg (129 lb)    Height:  5' 1" (1.549 m)        Constitutional: NAD, calm, comfortable, completely obtunded Vitals:   07/07/17 2001 07/07/17 2008 07/07/17 2114 07/07/17 2146  BP: 131/79  (!) 109/56 111/61  Pulse: (!) 113  (!) 106 98  Resp: 14  15 18  Temp: (!) 101.6 F (38.7 C)     TempSrc: Rectal     SpO2: 100%  100% 99%  Weight:  58.5 kg (129 lb)    Height:  5' 1" (1.549 m)     Chronically ill looking Eyes: PERRL, lids and conjunctivae normal ENMT: Mucous membranes are moist. Posterior pharynx clear of any exudate or lesions.Normal dentition.  Neck: normal, supple, no masses, no thyromegaly Respiratory: clear to auscultation bilaterally, no wheezing, no crackles. Normal respiratory effort. No accessory muscle use.  Cardiovascular: Regular rate and rhythm, no murmurs / rubs / gallops. No extremity edema. 2+ pedal pulses. No carotid bruits.  Abdomen: no tenderness, no masses palpated. No hepatosplenomegaly. Bowel sounds positive.  Musculoskeletal: no clubbing / cyanosis. No joint deformity upper and lower extremities. Good ROM, no contractures. Normal muscle tone.  Skin: no rashes, lesions, ulcers. No induration Neurologic: Patient is obtunded but arousable. CN 2-12 grossly intact. Sensation intact, DTR normal. Strength 5/5 in all 4.  Psychiatric: Patient not communicating.   Labs on Admission: I have personally reviewed following labs and imaging  studies  CBC: Recent Labs  Lab 07/07/17 2024  WBC 16.8*  NEUTROABS 10.5*  HGB 14.2  HCT 44.9  MCV 100.7*  PLT 258   Basic Metabolic Panel: Recent Labs  Lab 07/07/17 2024  NA 158*  K 4.1  CL 117*  CO2 28  GLUCOSE 290*  BUN 38*  CREATININE 1.19*  CALCIUM 9.8   GFR: Estimated Creatinine Clearance: 32 mL/min (A) (by C-G formula based on SCr of 1.19 mg/dL (H)). Liver Function Tests: Recent Labs  Lab 07/07/17 2024  AST 35  ALT 30  ALKPHOS 104  BILITOT 1.3*  PROT 8.1  ALBUMIN 3.5   No results for input(s): LIPASE, AMYLASE in the last 168 hours. No results for   input(s): AMMONIA in the last 168 hours. Coagulation Profile: Recent Labs  Lab 07/07/17 2024  INR 1.04   Cardiac Enzymes: No results for input(s): CKTOTAL, CKMB, CKMBINDEX, TROPONINI in the last 168 hours. BNP (last 3 results) No results for input(s): PROBNP in the last 8760 hours. HbA1C: No results for input(s): HGBA1C in the last 72 hours. CBG: No results for input(s): GLUCAP in the last 168 hours. Lipid Profile: No results for input(s): CHOL, HDL, LDLCALC, TRIG, CHOLHDL, LDLDIRECT in the last 72 hours. Thyroid Function Tests: No results for input(s): TSH, T4TOTAL, FREET4, T3FREE, THYROIDAB in the last 72 hours. Anemia Panel: No results for input(s): VITAMINB12, FOLATE, FERRITIN, TIBC, IRON, RETICCTPCT in the last 72 hours. Urine analysis:    Component Value Date/Time   COLORURINE AMBER (A) 07/07/2017 2026   APPEARANCEUR CLOUDY (A) 07/07/2017 2026   LABSPEC 1.021 07/07/2017 2026   PHURINE 5.0 07/07/2017 2026   GLUCOSEU >=500 (A) 07/07/2017 2026   HGBUR SMALL (A) 07/07/2017 2026   HGBUR negative 01/28/2009 1523   BILIRUBINUR NEGATIVE 07/07/2017 2026   BILIRUBINUR NEG 04/04/2011 0903   KETONESUR NEGATIVE 07/07/2017 2026   PROTEINUR NEGATIVE 07/07/2017 2026   UROBILINOGEN 0.2 04/04/2011 0903   UROBILINOGEN 0.2 01/28/2009 1523   NITRITE NEGATIVE 07/07/2017 2026   LEUKOCYTESUR LARGE (A)  07/07/2017 2026   Sepsis Labs: _0 (procalcitonin:4,lacticidven:4) )No results found for this or any previous visit (from the past 240 hour(s)).   Radiological Exams on Admission: Dg Chest 2 View  Result Date: 07/07/2017 CLINICAL DATA:  Abnormal labs and fever. Elevated white cell count. Weakness and lethargy for 2 days. History of dementia. EXAM: CHEST - 2 VIEW COMPARISON:  04/24/2017 FINDINGS: Shallow inspiration with linear atelectasis or fibrosis in the lung bases. This is similar to previous study. No airspace disease or consolidation in the lungs. No blunting of costophrenic angles. No pneumothorax. Mediastinal contours appear intact. Heart size and pulmonary vascularity are normal. IMPRESSION: Shallow inspiration with linear atelectasis or fibrosis in the lung bases. No focal consolidation. Electronically Signed   By: Lucienne Capers M.D.   On: 07/07/2017 21:20    Assessment/Plan Principal Problem:   Sepsis ([place]) Active Problems:   Insulin dependent type 2 diabetes mellitus, uncontrolled (HCC)   Dementia, Alzheimer's, with behavior disturbance   AKI (acute kidney injury) ([place])   Dehydration   GERD without esophagitis   Altered mental state   UTI (urinary tract infection)    #1 Sepsis: Most likely due to acute pyelonephritis.  We will admit the patient and start her on the sepsis protocol.  IV antibiotics.  Blood cultures and urine cultures will be collected and followed.  Monitor response.  Patient is a full code per family.  Vancomycin and cefepime initiated.  #2 UTI: Most likely acute pyelo-.  We will proceed as above.  #3 hypernatremia: Sodium is 158.  Patient appears very dehydrated.  We will expand her volume with saline and then changed to D5W.  #4 advanced dementia: Patient's baseline is confusion but still able to function.  Continue Namenda and Aricept.  #5 acute kidney injury: Most likely prerenal from dehydration.  Aggressively hydrate and monitor renal  function.  #6 diabetes: Poorly controlled at this point.  Continue home regimen with sliding scale insulin.  #7 GERD: Continue with PPIs.  #8 altered mental status: Most likely secondary to UTI and worsening dementia.   DVT prophylaxis: Heparin  Code Status: Full code  Family Communication: Son and daughter at bedside  Disposition Plan: Back to  skilled physical  Consults called: None  Admission status: Inpatient  Severity of Illness: The appropriate patient status for this patient is INPATIENT. Inpatient status is judged to be reasonable and necessary in order to provide the required intensity of service to ensure the patient's safety. The patient's presenting symptoms, physical exam findings, and initial radiographic and laboratory data in the context of their chronic comorbidities is felt to place them at high risk for further clinical deterioration. Furthermore, it is not anticipated that the patient will be medically stable for discharge from the hospital within 2 midnights of admission. The following factors support the patient status of inpatient.   " The patient's presenting symptoms include altered mental status and fever. " The worrisome physical exam findings include severe dehydration and confusion. " The initial radiographic and laboratory data are worrisome because of evidence of sepsis with sodium 158. " The chronic co-morbidities include dementia.   * I certify that at the point of admission it is my clinical judgment that the patient will require inpatient hospital care spanning beyond 2 midnights from the point of admission due to high intensity of service, high risk for further deterioration and high frequency of surveillance required.*    GARBA,LAWAL MD [facility] Pager [phone]  If 7PM-7AM, please contact night-coverage [email or web address] Password [email or web address]  07/07/2017, 10:49 PM    

## 2017-07-07 NOTE — ED Notes (Signed)
Bed: [id number] Expected date:  Expected time:  Means of arrival:  Comments: EMS from SNF-abnormal labs-fever 102.2

## 2017-07-07 NOTE — Progress Notes (Signed)
Pharmacy Antibiotic Note  [name] is a 77 y.o. female admitted on 07/07/2017 with sepsis and UTI.  Pharmacy has been consulted for Cefepime dosing.  Plan: Cefepime 1gm iv q12hr  Height: 5\' 1"  (154.9 cm) Weight: 129 lb (58.5 kg) IBW/kg (Calculated) : 47.8  Temp (24hrs), Avg:99.5 F (37.5 C), Min:97.4 F (36.3 C), Max:101.6 F (38.7 C)  Recent Labs  Lab 07/07/17 2024 07/07/17 2026 07/07/17 2224  WBC 16.8*  --   --   CREATININE 1.19*  --   --   LATICACIDVEN  --  2.01* 1.73    Estimated Creatinine Clearance: 32 mL/min (A) (by C-G formula based on SCr of 1.19 mg/dL (H)).    Allergies  Allergen Reactions  . Losartan Swelling    angioedema    Antimicrobials this admission: 7/5 vanco x1 7/5 zosyn x1 7/5 Cefepime >>   Dose adjustments this admission: -  Microbiology results: -  Thank you for allowing pharmacy to be a part of this patient's care.  Aleene DavidsonGrimsley Jr, [facility] 07/07/2017 10:56 PM

## 2017-07-07 NOTE — ED Notes (Signed)
ED TO INPATIENT HANDOFF REPORT  Name/Age/Gender [name] 77 y.o. female  Code Status Code Status History    Date Active Date Inactive Code Status Order ID Comments User Context   04/25/2017 0008 04/27/2017 1819 Full Code [id number]  Toy Baker, MD Inpatient   04/22/2017 1557 04/23/2017 1919 Full Code [id number]  Vashti Hey, MD ED   03/10/2017 0254 03/10/2017 2147 Full Code [id number]  Bethena Roys, MD ED   02/14/2012 2119 02/17/2012 1613 Full Code [id number]  Janell Quiet, MD ED      Home/SNF/Other Nursing Home  Chief Complaint Abnormal Labs; Fever  Level of Care/Admitting Diagnosis ED Disposition    ED Disposition Condition Comment   Admit  Hospital Area: [place] [[id number]]  Level of Care: Telemetry [5]  Admit to tele based on following criteria: Other see comments  Comments: sepsis  Diagnosis: Sepsis [facility]) [[id number]]  Admitting Physician: Elwyn Reach [[id number]]  Attending Physician: Elwyn Reach [[id number]]  Estimated length of stay: past midnight tomorrow  Certification:: I certify this patient will need inpatient services for at least 2 midnights  PT Class (Do Not Modify): Inpatient [101]  PT Acc Code (Do Not Modify): Private [1]       Medical History Past Medical History:  Diagnosis Date  . 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 Anxiety   . Blepharitis   . Dementia   . Depression   . Diabetes mellitus   . Essential hypertension 01/12/2009   Qualifier: Diagnosis of  By: Ernestina Patches MD, Remo Lipps    . GERD (gastroesophageal reflux disease)   . Heart murmur, systolic   . Hepatitis   . Hyperlipidemia   . Hypertension   . Osteoporosis   . Urinary frequency     Allergies Allergies  Allergen Reactions  . Losartan Swelling    angioedema    IV Location/Drains/Wounds Patient Lines/Drains/Airways Status   Active Line/Drains/Airways    Name:   Placement date:   Placement time:   Site:   Days:   Peripheral  IV 04/24/17 Left Antecubital   04/24/17    1602    Antecubital   74   External Urinary Catheter   04/25/17    0010    -   73          Labs/Imaging Results for orders placed or performed during the hospital encounter of 07/07/17 (from the past 48 hour(s))  Comprehensive metabolic panel     Status: Abnormal   Collection Time: 07/07/17  8:24 PM  Result Value Ref Range   Sodium 158 (H) 135 - 145 mmol/L   Potassium 4.1 3.5 - 5.1 mmol/L   Chloride 117 (H) 98 - 111 mmol/L    Comment: Please note change in reference range.   CO2 28 22 - 32 mmol/L   Glucose, Bld 290 (H) 70 - 99 mg/dL    Comment: Please note change in reference range.   BUN 38 (H) 8 - 23 mg/dL    Comment: Please note change in reference range.   Creatinine, Ser 1.19 (H) 0.44 - 1.00 mg/dL   Calcium 9.8 8.9 - 10.3 mg/dL   Total Protein 8.1 6.5 - 8.1 g/dL   Albumin 3.5 3.5 - 5.0 g/dL   AST 35 15 - 41 U/L   ALT 30 0 - 44 U/L    Comment: Please note change in reference range.   Alkaline Phosphatase 104 38 - 126 U/L   Total Bilirubin 1.3 (H) 0.3 - 1.2  mg/dL   GFR calc non Af Amer 43 (L) >60 mL/min   GFR calc Af Amer 49 (L) >60 mL/min    Comment: (NOTE) The eGFR has been calculated using the CKD EPI equation. This calculation has not been validated in all clinical situations. eGFR's persistently <60 mL/min signify possible Chronic Kidney Disease.    Anion gap 13 5 - 15    Comment: Performed at [facility], [place] [address] [place], [place] [address]  CBC with Differential     Status: Abnormal   Collection Time: 07/07/17  8:24 PM  Result Value Ref Range   WBC 16.8 (H) 4.0 - 10.5 K/uL   RBC 4.46 3.87 - 5.11 MIL/uL   Hemoglobin 14.2 12.0 - 15.0 g/dL   HCT 44.9 36.0 - 46.0 %   MCV 100.7 (H) 78.0 - 100.0 fL   MCH 31.8 26.0 - 34.0 pg   MCHC 31.6 30.0 - 36.0 g/dL   RDW 13.1 11.5 - 15.5 %   Platelets 258 150 - 400 K/uL   Neutrophils Relative % 62 %   Neutro Abs 10.5 (H) 1.7 - 7.7 K/uL   Lymphocytes  Relative 29 %   Lymphs Abs 4.8 (H) 0.7 - 4.0 K/uL   Monocytes Relative 8 %   Monocytes Absolute 1.3 (H) 0.1 - 1.0 K/uL   Eosinophils Relative 1 %   Eosinophils Absolute 0.2 0.0 - 0.7 K/uL   Basophils Relative 0 %   Basophils Absolute 0.0 0.0 - 0.1 K/uL    Comment: Performed at [facility], [place] [address] [place], [place] [address]  Protime-INR     Status: None   Collection Time: 07/07/17  8:24 PM  Result Value Ref Range   Prothrombin Time 13.5 11.4 - 15.2 seconds   INR 1.04     Comment: Performed at [facility], [place] [address] [place], [place] [address]  I-Stat CG4 Lactic Acid, ED     Status: Abnormal   Collection Time: 07/07/17  8:26 PM  Result Value Ref Range   Lactic Acid, Venous 2.01 (HH) 0.5 - 1.9 mmol/L   Comment NOTIFIED PHYSICIAN   Urinalysis, Routine w reflex microscopic     Status: Abnormal   Collection Time: 07/07/17  8:26 PM  Result Value Ref Range   Color, Urine AMBER (A) YELLOW    Comment: BIOCHEMICALS MAY BE AFFECTED BY COLOR   APPearance CLOUDY (A) CLEAR   Specific Gravity, Urine 1.021 1.005 - 1.030   pH 5.0 5.0 - 8.0   Glucose, UA >=500 (A) NEGATIVE mg/dL   Hgb urine dipstick SMALL (A) NEGATIVE   Bilirubin Urine NEGATIVE NEGATIVE   Ketones, ur NEGATIVE NEGATIVE mg/dL   Protein, ur NEGATIVE NEGATIVE mg/dL   Nitrite NEGATIVE NEGATIVE   Leukocytes, UA LARGE (A) NEGATIVE   RBC / HPF 21-50 0 - 5 RBC/hpf   WBC, UA >50 (H) 0 - 5 WBC/hpf   Bacteria, UA MANY (A) NONE SEEN   Squamous Epithelial / LPF 0-5 0 - 5   Mucus PRESENT     Comment: Performed at [facility], [place] [address], [place], [place] [address]  I-Stat CG4 Lactic Acid, ED     Status: None   Collection Time: 07/07/17 10:24 PM  Result Value Ref Range   Lactic Acid, Venous 1.73 0.5 - 1.9 mmol/L   Dg Chest 2 View  Result Date: 07/07/2017 CLINICAL DATA:  Abnormal labs and fever. Elevated white cell count. Weakness and lethargy  for 2 days.  History of dementia. EXAM: CHEST - 2 VIEW COMPARISON:  04/24/2017 FINDINGS: Shallow inspiration with linear atelectasis or fibrosis in the lung bases. This is similar to previous study. No airspace disease or consolidation in the lungs. No blunting of costophrenic angles. No pneumothorax. Mediastinal contours appear intact. Heart size and pulmonary vascularity are normal. IMPRESSION: Shallow inspiration with linear atelectasis or fibrosis in the lung bases. No focal consolidation. Electronically Signed   By: Lucienne Capers M.D.   On: 07/07/2017 21:20    Pending Labs Unresulted Labs (From admission, onward)   Start     Ordered   07/07/17 2020  Urine culture  STAT,   STAT    Question:  Patient immune status  Answer:  Normal   07/07/17 2019   07/07/17 2010  Culture, blood (Routine x 2)  BLOOD CULTURE X 2,   STAT     07/07/17 2009   Signed and Held  Culture, blood (x 2)  BLOOD CULTURE X 2,   STAT    Comments:  INITIATE ANTIBIOTICS WITHIN 1 HOUR AFTER BLOOD CULTURES DRAWN.  If unable to obtain blood cultures, call MD immediately regarding antibiotic instructions.    Signed and Held   Signed and Held  CBC with Differential  STAT,   R     Signed and Held   Signed and Held  Comprehensive metabolic panel  STAT,   R     Signed and Held   Signed and Held  Lactic acid, plasma  STAT Now then every 3 hours,   STAT     Signed and Held   Signed and Held  Procalcitonin  STAT,   R     Signed and Held   Signed and Held  Protime-INR  STAT,   R     Signed and Held   Signed and Held  APTT  STAT,   R     Signed and Held   Signed and Held  CBC with Differential/Platelet  Tomorrow morning,   R     Signed and Held   Signed and Held  Comprehensive metabolic panel  Tomorrow morning,   R     Signed and Held      Vitals/Pain Today's Vitals   07/07/17 2008 07/07/17 2114 07/07/17 2146 07/07/17 2250  BP:  (!) 109/56 111/61 (!) 100/59  Pulse:  (!) 106 98 (!) 104  Resp:  _0 Temp:      TempSrc:       SpO2:  100% 99% 96%  Weight: 129 lb (58.5 kg)     Height: _1  (1.549 m)       Isolation Precautions No active isolations  Medications Medications  piperacillin-tazobactam (ZOSYN) IVPB 3.375 g (0 g Intravenous Stopped 07/07/17 2106)  vancomycin (VANCOCIN) IVPB 1000 mg/200 mL premix (0 mg Intravenous Stopped 07/07/17 2224)  lactated ringers bolus 1,000 mL (0 mLs Intravenous Stopped 07/07/17 2145)  acetaminophen (TYLENOL) suppository 650 mg (650 mg Rectal Given 07/07/17 2117)    Mobility non-ambulatory

## 2017-07-07 NOTE — ED Notes (Signed)
Patient transported to X-ray 

## 2017-07-07 NOTE — Progress Notes (Signed)
Location:   [facility] Room Number: 222 B Place of Service:  SNF (31)   CODE STATUS: Full Code  Allergies  Allergen Reactions  . Losartan Swelling    angioedema    Chief Complaint  Patient presents with  . Acute Visit    change in status    HPI:  I have been asked to see her for a change in her status. Staff reports that her intake today is very poor with little fluid intake. Her cbgs are elevated today up to 400. She is unable to participate in the phi or ros. There are no reports of fevers present; no foul urine. She is more lethargic today.    Past Medical History:  Diagnosis Date  . 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 Anxiety   . Blepharitis   . Dementia   . Depression   . Diabetes mellitus   . Essential hypertension 01/12/2009   Qualifier: Diagnosis of  By: Alvester Morin MD, Viviann Spare    . GERD (gastroesophageal reflux disease)   . Heart murmur, systolic   . Hepatitis   . Hyperlipidemia   . Hypertension   . Osteoporosis   . Urinary frequency     Past Surgical History:  Procedure Laterality Date  . COLONOSCOPY N/A 02/16/2012   Procedure: COLONOSCOPY;  Surgeon: Vertell Novak., MD;  Location: [facility] ENDOSCOPY;  Service: Endoscopy;  Laterality: N/A;  . ESOPHAGOGASTRODUODENOSCOPY N/A 02/16/2012   Procedure: ESOPHAGOGASTRODUODENOSCOPY (EGD);  Surgeon: Vertell Novak., MD;  Location: [facility] ENDOSCOPY;  Service: Endoscopy;  Laterality: N/A;  . THYROIDECTOMY     in 2000    Social History   Socioeconomic History  . Marital status: Widowed    Spouse name: Not on file  . Number of children: Not on file  . Years of education: 69  . Highest education level: Not on file  Occupational History  . Not on file  Social Needs  . Financial resource strain: Not on file  . Food insecurity:    Worry: Not on file    Inability: Not on file  . Transportation needs:    Medical: Not on file    Non-medical: Not on file  Tobacco Use  . Smoking status:  Never Smoker  . Smokeless tobacco: Never Used  Substance and Sexual Activity  . Alcohol use: No    Alcohol/week: 0.0 oz  . Drug use: No  . Sexual activity: Never  Lifestyle  . Physical activity:    Days per week: Not on file    Minutes per session: Not on file  . Stress: Not on file  Relationships  . Social connections:    Talks on phone: Not on file    Gets together: Not on file    Attends religious service: Not on file    Active member of club or organization: Not on file    Attends meetings of clubs or organizations: Not on file    Relationship status: Not on file  . Intimate partner violence:    Fear of current or ex partner: Not on file    Emotionally abused: Not on file    Physically abused: Not on file    Forced sexual activity: Not on file  Other Topics Concern  . Not on file  Social History Narrative   Lives with daughter and grand-daughter to help informally supervise pt at home.   Pt also in adult daycare during the week.     Family History  Problem  Relation Age of Onset  . Cancer Sister        brain  . Diabetes Son   . Hypertension Son   . Diabetes Daughter       VITAL SIGNS BP 138/90   Pulse 94   Temp (!) 97.4 F (36.3 C)   Resp 18   Ht 5\' 1"  (1.549 m)   Wt 129 lb 3.2 oz (58.6 kg)   SpO2 96%   BMI 24.41 kg/m   Outpatient Encounter Medications as of 07/07/2017  Medication Sig  . alendronate (FOSAMAX) 70 MG tablet Take 70 mg by mouth every Tuesday. Take with a full glass of water on an empty stomach.   Marland Kitchen amLODipine (NORVASC) 5 MG tablet Take 5 mg by mouth daily.  . calcium-vitamin D (OSCAL WITH D) 500-200 MG-UNIT per tablet Take 1 tablet by mouth daily.  Marland Kitchen donepezil (ARICEPT) 5 MG tablet Take 1 tablet (5 mg total) by mouth at bedtime.  . hydrALAZINE (APRESOLINE) 10 MG tablet Take 10 mg by mouth 3 (three) times daily.  . insulin aspart (NOVOLOG FLEXPEN) 100 UNIT/ML FlexPen Inject 6 Units into the skin 3 (three) times daily with meals.   . Insulin  Glargine (LANTUS SOLOSTAR) 100 UNIT/ML Solostar Pen Inject 20 Units into the skin daily at 10 pm.   . linagliptin (TRADJENTA) 5 MG TABS tablet Take 5 mg by mouth daily.  . memantine (NAMENDA XR) 28 MG CP24 24 hr capsule Take 28 mg by mouth daily.  . metFORMIN (GLUCOPHAGE-XR) 500 MG 24 hr tablet Take 500 mg by mouth every evening.  . metoprolol succinate (TOPROL-XL) 25 MG 24 hr tablet Take 25 mg by mouth daily.  . mirtazapine (REMERON) 15 MG tablet Take 15 mg by mouth at bedtime.  . Multiple Vitamin (MULTIVITAMIN) tablet Take 1 tablet by mouth daily.  . Nutritional Supplements (NUTRITIONAL SUPPLEMENT PO) Low Concentrated Sweets diet - Regular texture, NAS  . omeprazole (PRILOSEC) 40 MG capsule Take 1 capsule (40 mg total) by mouth daily.   No facility-administered encounter medications on file as of 07/07/2017.      SIGNIFICANT DIAGNOSTIC EXAMS  PREVIOUS:  04-24-17: chest x-ray: No active cardiopulmonary disease.   04-24-17: ct of head: Atrophy and small vessel disease, progressive since 2007. No acute intracranial findings.  04-24-17: ct of abdomen and pelvis: 1. Mild diffuse small bowel distention without mechanical bowel obstruction compatible with a mild small bowel enteritis. 2. Coronary arteriosclerosis. 3. Punctate nonobstructing right lower pole renal calculus. No hydroureteronephrosis. 4. Fibroid uterus.  NO NEW EXAMS    LABS REVIEWED; PEVIOUS  04-24-17: wbc 9.5; hgb 15.1; hct 45.0; mcv 247; plt 247 glucose 177 bun 30; creat 1.04; k+ 4.7; na++ 145; ca 9.7; liver normal albumin 4.0; urine culture no growth 04-25-17: wbc 7.3; hgb 13.3; hct 40.0; mcv 95.7; plt 194; glucose 105; bun 24; creat 0.97; k+ 4.2; na++ 143; ca 8.6; liver normal albumin 3.2; mag 1.7; phos 2.2; hgb a1c 5.6  05-04-17: wbc 14.2; hgb 14.1; hct 42.7; mcv 95.2; plt 214; glucose 275; bun 18.5; creat 0.86; k+ 4.4; an++ 140; ca 9.4   NO NEW LABS.   Review of Systems  Unable to perform ROS: Dementia (nonverbal )     Physical Exam  Constitutional: No distress.  Frail   Neck: No thyromegaly present.  Cardiovascular: Normal rate, regular rhythm, normal heart sounds and intact distal pulses.  Pulmonary/Chest: Effort normal and breath sounds normal. No respiratory distress.  Is unable to take deep breath on  command   Abdominal: Soft. Bowel sounds are normal. She exhibits no distension. There is no tenderness.  Musculoskeletal: She exhibits no edema.  Is able to move all extremities   Lymphadenopathy:    She has no cervical adenopathy.  Neurological:  Is lethargic; but aware   Skin: Skin is warm. She is not diaphoretic.    ASSESSMENT/ PLAN:  TODAY:   1. Dementia, alzheimer's with behavior disturbance 2. Altered mental status   There are no signs of infection on physical exam Will get a cbc and bmp This could be related to her dementia and self limiting.  Will monitor her status.       MD is aware of resident's narcotic use and is in agreement with current plan of care. We will attempt to wean resident as apropriate   Synthia Innocenteborah Green NP [facility]  Contact [phone] Monday through Friday 8am- 5pm  After hours call [phone]

## 2017-07-07 NOTE — ED Provider Notes (Signed)
[facility]-EMERGENCY DEPT Provider Note   CSN: [id number] Arrival date & time: 07/07/17  1946     History   Chief Complaint Chief Complaint  Patient presents with  . Abnormal Lab  . Fever    HPI [name] is a 77 y.o. female.  77 year old female with past medical history including dementia, hypertension, type 2 diabetes mellitus who presents with fever.  Patient was brought in by EMS for fever that began today up to 102.2.  She had lab work at her nursing facility today that showed WBC count of 17.4.  They reported that she has been more weak and lethargic over the past 2 days.  Patient unable to answer any questions.  LEVEL5 CAVEAT DUE TO AMS  The history is provided by the EMS personnel.  Abnormal Lab  Fever      Past Medical History:  Diagnosis Date  . 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 Anxiety   . Blepharitis   . Dementia   . Depression   . Diabetes mellitus   . Essential hypertension 01/12/2009   Qualifier: Diagnosis of  By: Alvester Morin MD, Viviann Spare    . GERD (gastroesophageal reflux disease)   . Heart murmur, systolic   . Hepatitis   . Hyperlipidemia   . Hypertension   . Osteoporosis   . Urinary frequency     Patient Active Problem List   Diagnosis Date Noted  . Altered mental state 07/07/2017  . 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 GERD without esophagitis 05/01/2017  . AKI (acute kidney injury) (HCC) 04/24/2017  . Dehydration 04/24/2017  . Acute encephalopathy 04/24/2017  . Angioedema 04/22/2017  . HCAP (healthcare-associated pneumonia) 03/10/2017  . Anemia 02/14/2012  . Blepharitis of left upper eyelid 04/25/2011  . Cough 12/30/2010  . Right ear pain 11/20/2010  . Urinary frequency 11/20/2010  . Pain in gums 06/07/2010  . Systolic murmur 03/18/2010  . Right thigh pain 03/18/2010  . Urge incontinence 03/18/2010  . Dementia, Alzheimer's, with behavior disturbance 09/30/2009  . Osteoporosis  09/21/2009  . CONSTIPATION 05/15/2009  . Insulin dependent type 2 diabetes mellitus, uncontrolled (HCC) 01/12/2009  . HYPERLIPIDEMIA 01/12/2009  . ANXIETY 01/12/2009  . Depression, major, single episode, in partial remission (HCC) 01/12/2009  . Essential hypertension 01/12/2009  . THYROIDECTOMY, HX OF 01/12/2009    Past Surgical History:  Procedure Laterality Date  . COLONOSCOPY N/A 02/16/2012   Procedure: COLONOSCOPY;  Surgeon: Vertell Novak., MD;  Location: [facility] ENDOSCOPY;  Service: Endoscopy;  Laterality: N/A;  . ESOPHAGOGASTRODUODENOSCOPY N/A 02/16/2012   Procedure: ESOPHAGOGASTRODUODENOSCOPY (EGD);  Surgeon: Vertell Novak., MD;  Location: [facility] ENDOSCOPY;  Service: Endoscopy;  Laterality: N/A;  . THYROIDECTOMY     in 2000     OB History   None      Home Medications    Prior to Admission medications   Medication Sig Start Date End Date Taking? Authorizing Provider  amLODipine (NORVASC) 5 MG tablet Take 5 mg by mouth daily.   Yes [provider]  calcium-vitamin D (OSCAL WITH D) 500-200 MG-UNIT per tablet Take 1 tablet by mouth daily. 02/17/12  Yes Rai, Ripudeep K, MD  donepezil (ARICEPT) 5 MG tablet Take 1 tablet (5 mg total) by mouth at bedtime. 06/06/11  Yes Floydene Flock, MD  hydrALAZINE (APRESOLINE) 10 MG tablet Take 10 mg by mouth 3 (three) times daily.   Yes [provider]  insulin aspart (NOVOLOG FLEXPEN) 100 UNIT/ML FlexPen Inject 6 Units into  the skin 3 (three) times daily with meals.  05/18/17  Yes [provider]  Insulin Glargine (LANTUS SOLOSTAR) 100 UNIT/ML Solostar Pen Inject 20 Units into the skin daily at 10 pm.  05/18/17  Yes [provider]  linagliptin (TRADJENTA) 5 MG TABS tablet Take 5 mg by mouth daily.   Yes [provider]  memantine (NAMENDA XR) 28 MG CP24 24 hr capsule Take 28 mg by mouth daily.   Yes [provider]  metFORMIN (GLUCOPHAGE-XR) 500 MG 24 hr tablet Take 500 mg by mouth every  evening.   Yes [provider]  metoprolol succinate (TOPROL-XL) 25 MG 24 hr tablet Take 25 mg by mouth daily.   Yes [provider]  mirtazapine (REMERON) 15 MG tablet Take 15 mg by mouth at bedtime.   Yes [provider]  Multiple Vitamin (MULTIVITAMIN) tablet Take 1 tablet by mouth daily.   Yes [provider]  omeprazole (PRILOSEC) 40 MG capsule Take 1 capsule (40 mg total) by mouth daily. 06/06/11  Yes Floydene Flock, MD  alendronate (FOSAMAX) 70 MG tablet Take 70 mg by mouth every Tuesday. Take with a full glass of water on an empty stomach.     [provider]  Nutritional Supplements (NUTRITIONAL SUPPLEMENT PO) Low Concentrated Sweets diet - Regular texture, NAS    [provider]    Family History Family History  Problem Relation Age of Onset  . Cancer Sister        brain  . Diabetes Son   . Hypertension Son   . Diabetes Daughter     Social History Social History   Tobacco Use  . Smoking status: Never Smoker  . Smokeless tobacco: Never Used  Substance Use Topics  . Alcohol use: No    Alcohol/week: 0.0 oz  . Drug use: No     Allergies   Losartan   Review of Systems Review of Systems  Unable to perform ROS: Dementia  Constitutional: Positive for fever.     Physical Exam Updated Vital Signs BP 111/61 (BP Location: Left Arm)   Pulse 98   Temp (!) 101.6 F (38.7 C) (Rectal)   Resp 18   Ht 5\' 1"  (1.549 m)   Wt 58.5 kg (129 lb)   SpO2 99%   BMI 24.37 kg/m   Physical Exam  Constitutional: She appears well-developed. No distress.  Frail, elderly, chronically ill appearing woman  HENT:  Head: Normocephalic and atraumatic.  Dry mucous membranes; edentulous   Eyes: Pupils are equal, round, and reactive to light. Conjunctivae are normal.  Neck: Neck supple.  Cardiovascular: Regular rhythm and normal heart sounds. Tachycardia present.  No murmur heard. Pulmonary/Chest: Effort normal and breath sounds  normal.  Abdominal: Soft. Bowel sounds are normal. She exhibits no distension. There is no tenderness.  Musculoskeletal: She exhibits no edema.  Neurological:  Sleepy, Disoriented, non-verbal  Skin: Skin is warm and dry.  Nursing note and vitals reviewed.    ED Treatments / Results  Labs (all labs ordered are listed, but only abnormal results are displayed) Labs Reviewed  COMPREHENSIVE METABOLIC PANEL - Abnormal; Notable for the following components:      Result Value   Sodium 158 (*)    Chloride 117 (*)    Glucose, Bld 290 (*)    BUN 38 (*)    Creatinine, Ser 1.19 (*)    Total Bilirubin 1.3 (*)    GFR calc non Af Amer 43 (*)  GFR calc Af Amer 49 (*)    All other components within normal limits  CBC WITH DIFFERENTIAL/PLATELET - Abnormal; Notable for the following components:   WBC 16.8 (*)    MCV 100.7 (*)    Neutro Abs 10.5 (*)    Lymphs Abs 4.8 (*)    Monocytes Absolute 1.3 (*)    All other components within normal limits  URINALYSIS, ROUTINE W REFLEX MICROSCOPIC - Abnormal; Notable for the following components:   Color, Urine AMBER (*)    APPearance CLOUDY (*)    Glucose, UA >=500 (*)    Hgb urine dipstick SMALL (*)    Leukocytes, UA LARGE (*)    WBC, UA >50 (*)    Bacteria, UA MANY (*)    All other components within normal limits  I-STAT CG4 LACTIC ACID, ED - Abnormal; Notable for the following components:   Lactic Acid, Venous 2.01 (*)    All other components within normal limits  CULTURE, BLOOD (ROUTINE X 2)  CULTURE, BLOOD (ROUTINE X 2)  URINE CULTURE  PROTIME-INR  I-STAT CG4 LACTIC ACID, ED    EKG None  Radiology Dg Chest 2 View  Result Date: 07/07/2017 CLINICAL DATA:  Abnormal labs and fever. Elevated white cell count. Weakness and lethargy for 2 days. History of dementia. EXAM: CHEST - 2 VIEW COMPARISON:  04/24/2017 FINDINGS: Shallow inspiration with linear atelectasis or fibrosis in the lung bases. This is similar to previous study. No airspace  disease or consolidation in the lungs. No blunting of costophrenic angles. No pneumothorax. Mediastinal contours appear intact. Heart size and pulmonary vascularity are normal. IMPRESSION: Shallow inspiration with linear atelectasis or fibrosis in the lung bases. No focal consolidation. Electronically Signed   By: Burman NievesWilliam  Stevens M.D.   On: 07/07/2017 21:20    Procedures .Critical Care Performed by: Laurence SpatesLittle, Joni Colegrove Morgan, MD Authorized by: Laurence SpatesLittle, Alicia Ackert Morgan, MD   Critical care provider statement:    Critical care time (minutes):  30   Critical care time was exclusive of:  Separately billable procedures and treating other patients   Critical care was necessary to treat or prevent imminent or life-threatening deterioration of the following conditions:  Sepsis   Critical care was time spent personally by me on the following activities:  Evaluation of patient's response to treatment, examination of patient, obtaining history from patient or surrogate, ordering and performing treatments and interventions, ordering and review of laboratory studies, ordering and review of radiographic studies and re-evaluation of patient's condition   (including critical care time)  Medications Ordered in ED Medications  vancomycin (VANCOCIN) IVPB 1000 mg/200 mL premix (1,000 mg Intravenous New Bag/Given 07/07/17 2120)  piperacillin-tazobactam (ZOSYN) IVPB 3.375 g (0 g Intravenous Stopped 07/07/17 2106)  lactated ringers bolus 1,000 mL (0 mLs Intravenous Stopped 07/07/17 2145)  acetaminophen (TYLENOL) suppository 650 mg (650 mg Rectal Given 07/07/17 2117)     Initial Impression / Assessment and Plan / ED Course  I have reviewed the triage vital signs and the nursing notes.  Pertinent labs & imaging results that were available during my care of the patient were reviewed by me and considered in my medical decision making (see chart for details).     She was altered, nonverbal on exam with rectal temperature of  101.6, heart rate 113, BP 131/79, normal saturation on room air.  No signs of skin breakdown and no focal areas of tenderness.  Initiated code sepsis with blood and urine cultures, IV fluids, vancomycin and Zosyn, and Tylenol.  Labs notable for lactate 2.01, UA consistent with infection, Na 158, Cl 117, glucose 290, Cr 1.19, WBC 16.8. CXR negative. I  Suspect fever and AMS are related to UTI.   Discussed admission w/ [facility], Dr. Mikeal Hawthorne, appreciate assistance. Pt admitted for further care. Final Clinical Impressions(s) / ED Diagnoses   Final diagnoses:  None    ED Discharge Orders    None       Natalyn Szymanowski, Ambrose [place], MD 07/07/17 2202

## 2017-07-07 NOTE — Progress Notes (Signed)
Consults received from an ED physician for vancomycin and zosyn per pharmacy dosing.  The patient's profile has been reviewed for ht/wt/allergies/indication/available labs.   One time orders have been placed by EDP for vancomycin 1gm IV x 1 and zosyn 3.375gm IV x 1.    Further antibiotics/pharmacy consults should be ordered by admitting physician if indicated.                       Thank you,  Juliette Alcideustin Payson Crumby, PharmD, BCPS.   07/07/2017 8:25 PM

## 2017-07-08 ENCOUNTER — Encounter (HOSPITAL_COMMUNITY): Payer: Self-pay

## 2017-07-08 ENCOUNTER — Other Ambulatory Visit: Payer: Self-pay

## 2017-07-08 DIAGNOSIS — K219 Gastro-esophageal reflux disease without esophagitis: Secondary | ICD-10-CM

## 2017-07-08 DIAGNOSIS — E870 Hyperosmolality and hypernatremia: Secondary | ICD-10-CM | POA: Diagnosis present

## 2017-07-08 DIAGNOSIS — E860 Dehydration: Secondary | ICD-10-CM

## 2017-07-08 DIAGNOSIS — N179 Acute kidney failure, unspecified: Secondary | ICD-10-CM

## 2017-07-08 LAB — GLUCOSE, CAPILLARY
GLUCOSE-CAPILLARY: 182 mg/dL — AB (ref 70–99)
GLUCOSE-CAPILLARY: 66 mg/dL — AB (ref 70–99)
GLUCOSE-CAPILLARY: 187 mg/dL — AB (ref 70–99)
Glucose-Capillary: 348 mg/dL — ABNORMAL HIGH (ref 70–99)
Glucose-Capillary: 110 mg/dL — ABNORMAL HIGH (ref 70–99)
Glucose-Capillary: 105 mg/dL — ABNORMAL HIGH (ref 70–99)

## 2017-07-08 LAB — PROCALCITONIN: Procalcitonin: <0.1 ng/mL

## 2017-07-08 LAB — COMPREHENSIVE METABOLIC PANEL
ALBUMIN: 2.9 g/dL — AB (ref 3.5–5.0)
ALBUMIN: 2.5 g/dL — AB (ref 3.5–5.0)
ALT: 27 U/L (ref 0–44)
ALT: 22 U/L (ref 0–44)
AST: 38 U/L (ref 15–41)
AST: 30 U/L (ref 15–41)
Alkaline Phosphatase: 84 U/L (ref 38–126)
Alkaline Phosphatase: 73 U/L (ref 38–126)
Anion gap: 10 (ref 5–15)
Anion gap: 8 (ref 5–15)
BILIRUBIN TOTAL: 1.5 mg/dL — AB (ref 0.3–1.2)
BILIRUBIN TOTAL: 1 mg/dL (ref 0.3–1.2)
BUN: 37 mg/dL — AB (ref 8–23)
BUN: 34 mg/dL — AB (ref 8–23)
CHLORIDE: 115 mmol/L — AB (ref 98–111)
CO2: 26 mmol/L (ref 22–32)
CO2: 24 mmol/L (ref 22–32)
CREATININE: 1.19 mg/dL — AB (ref 0.44–1.00)
Calcium: 9.1 mg/dL (ref 8.9–10.3)
Calcium: 8.1 mg/dL — ABNORMAL LOW (ref 8.9–10.3)
Chloride: 123 mmol/L — ABNORMAL HIGH (ref 98–111)
Creatinine, Ser: 1.38 mg/dL — ABNORMAL HIGH (ref 0.44–1.00)
GFR calc Af Amer: 41 mL/min — ABNORMAL LOW (ref >60)
GFR calc Af Amer: 49 mL/min — ABNORMAL LOW (ref >60)
GFR calc non Af Amer: 36 mL/min — ABNORMAL LOW (ref >60)
GFR calc non Af Amer: 43 mL/min — ABNORMAL LOW (ref >60)
GLUCOSE: 280 mg/dL — AB (ref 70–99)
Glucose, Bld: 392 mg/dL — ABNORMAL HIGH (ref 70–99)
POTASSIUM: 4.9 mmol/L (ref 3.5–5.1)
Potassium: 3.6 mmol/L (ref 3.5–5.1)
SODIUM: 155 mmol/L — AB (ref 135–145)
Sodium: 151 mmol/L — ABNORMAL HIGH (ref 135–145)
TOTAL PROTEIN: 5.8 g/dL — AB (ref 6.5–8.1)
Total Protein: 6.6 g/dL (ref 6.5–8.1)

## 2017-07-08 LAB — LACTIC ACID, PLASMA
Lactic Acid, Venous: 1.6 mmol/L (ref 0.5–1.9)
Lactic Acid, Venous: 2.1 mmol/L (ref 0.5–1.9)

## 2017-07-08 LAB — CBC WITH DIFFERENTIAL/PLATELET
BASOS PCT: 0 %
BASOS PCT: 0 %
Basophils Absolute: 0 10*3/uL (ref 0.0–0.1)
Basophils Absolute: 0 10*3/uL (ref 0.0–0.1)
EOS ABS: 0.3 10*3/uL (ref 0.0–0.7)
Eosinophils Absolute: 0.2 10*3/uL (ref 0.0–0.7)
Eosinophils Relative: 2 %
Eosinophils Relative: 2 %
HCT: 37.4 % (ref 36.0–46.0)
HEMATOCRIT: 40.4 % (ref 36.0–46.0)
HEMOGLOBIN: 11.7 g/dL — AB (ref 12.0–15.0)
Hemoglobin: 12.7 g/dL (ref 12.0–15.0)
Lymphocytes Relative: 39 %
Lymphocytes Relative: 35 %
Lymphs Abs: 5.7 10*3/uL — ABNORMAL HIGH (ref 0.7–4.0)
Lymphs Abs: 4.4 10*3/uL — ABNORMAL HIGH (ref 0.7–4.0)
MCH: 31.8 pg (ref 26.0–34.0)
MCH: 31.6 pg (ref 26.0–34.0)
MCHC: 31.4 g/dL (ref 30.0–36.0)
MCHC: 31.3 g/dL (ref 30.0–36.0)
MCV: 101.3 fL — ABNORMAL HIGH (ref 78.0–100.0)
MCV: 101.1 fL — ABNORMAL HIGH (ref 78.0–100.0)
MONO ABS: 1.2 10*3/uL — AB (ref 0.1–1.0)
Monocytes Absolute: 1 10*3/uL (ref 0.1–1.0)
Monocytes Relative: 8 %
Monocytes Relative: 8 %
NEUTROS ABS: 7.4 10*3/uL (ref 1.7–7.7)
NEUTROS PCT: 51 %
NEUTROS PCT: 55 %
Neutro Abs: 6.9 10*3/uL (ref 1.7–7.7)
Platelets: 194 10*3/uL (ref 150–400)
Platelets: 174 10*3/uL (ref 150–400)
RBC: 3.99 MIL/uL (ref 3.87–5.11)
RBC: 3.7 MIL/uL — AB (ref 3.87–5.11)
RDW: 13.1 % (ref 11.5–15.5)
RDW: 13.2 % (ref 11.5–15.5)
WBC: 14.6 10*3/uL — ABNORMAL HIGH (ref 4.0–10.5)
WBC: 12.6 10*3/uL — AB (ref 4.0–10.5)

## 2017-07-08 LAB — BASIC METABOLIC PANEL
Anion gap: 6 (ref 5–15)
BUN: 32 mg/dL — AB (ref 8–23)
CHLORIDE: 124 mmol/L — AB (ref 98–111)
CO2: 27 mmol/L (ref 22–32)
Calcium: 8.3 mg/dL — ABNORMAL LOW (ref 8.9–10.3)
Creatinine, Ser: 1.26 mg/dL — ABNORMAL HIGH (ref 0.44–1.00)
GFR calc non Af Amer: 40 mL/min — ABNORMAL LOW (ref >60)
GFR, EST AFRICAN AMERICAN: 46 mL/min — AB (ref >60)
Glucose, Bld: 178 mg/dL — ABNORMAL HIGH (ref 70–99)
POTASSIUM: 3.7 mmol/L (ref 3.5–5.1)
Sodium: 157 mmol/L — ABNORMAL HIGH (ref 135–145)

## 2017-07-08 LAB — MRSA PCR SCREENING: MRSA by PCR: NEGATIVE

## 2017-07-08 LAB — PROTIME-INR
INR: 1.06
PROTHROMBIN TIME: 13.7 s (ref 11.4–15.2)

## 2017-07-08 LAB — APTT: aPTT: 22 seconds — ABNORMAL LOW (ref 24–36)

## 2017-07-08 LAB — SODIUM
SODIUM: 156 mmol/L — AB (ref 135–145)
Sodium: 157 mmol/L — ABNORMAL HIGH (ref 135–145)

## 2017-07-08 MED ORDER — SODIUM CHLORIDE 0.9 % IV SOLN
1.0000 g | INTRAVENOUS | Status: DC
  Filled 2017-07-08: qty 1

## 2017-07-08 MED ORDER — AMLODIPINE BESYLATE 5 MG PO TABS
5.0000 mg | ORAL_TABLET | Freq: Every day | ORAL | Status: DC
  Administered 2017-07-08 – 2017-07-10 (×3): 5 mg via ORAL
  Filled 2017-07-08 (×3): qty 1

## 2017-07-08 MED ORDER — ONDANSETRON HCL 4 MG/2ML IJ SOLN: 4.0000 mg | Freq: Four times a day (QID) | INTRAMUSCULAR | Status: DC | PRN

## 2017-07-08 MED ORDER — CALCIUM CARBONATE-VITAMIN D 500-200 MG-UNIT PO TABS
1.0000 | ORAL_TABLET | Freq: Every day | ORAL | Status: DC
  Administered 2017-07-08 – 2017-07-10 (×3): 1 via ORAL
  Filled 2017-07-08 (×3): qty 1

## 2017-07-08 MED ORDER — MEMANTINE HCL ER 28 MG PO CP24
28.0000 mg | ORAL_CAPSULE | Freq: Every day | ORAL | Status: DC
  Administered 2017-07-09 – 2017-07-10 (×2): 28 mg via ORAL
  Filled 2017-07-08 (×3): qty 1

## 2017-07-08 MED ORDER — INSULIN GLARGINE 100 UNIT/ML ~~LOC~~ SOLN
20.0000 [IU] | Freq: Every day | SUBCUTANEOUS | Status: DC
  Administered 2017-07-08 (×2): 20 [IU] via SUBCUTANEOUS
  Filled 2017-07-08 (×2): qty 0.2

## 2017-07-08 MED ORDER — ENSURE ENLIVE PO LIQD
237.0000 mL | Freq: Two times a day (BID) | ORAL | Status: DC
  Administered 2017-07-08 – 2017-07-10 (×5): 237 mL via ORAL

## 2017-07-08 MED ORDER — SODIUM CHLORIDE 0.9 % IV SOLN
INTRAVENOUS | Status: DC
  Administered 2017-07-08: 04:00:00 via INTRAVENOUS

## 2017-07-08 MED ORDER — LACTATED RINGERS IV SOLN
INTRAVENOUS | Status: DC
  Administered 2017-07-08: 13:00:00 via INTRAVENOUS

## 2017-07-08 MED ORDER — INSULIN ASPART 100 UNIT/ML ~~LOC~~ SOLN
6.0000 [IU] | Freq: Three times a day (TID) | SUBCUTANEOUS | Status: DC
  Administered 2017-07-08: 6 [IU] via SUBCUTANEOUS

## 2017-07-08 MED ORDER — DONEPEZIL HCL 5 MG PO TABS
5.0000 mg | ORAL_TABLET | Freq: Every day | ORAL | Status: DC
  Administered 2017-07-08 – 2017-07-09 (×3): 5 mg via ORAL
  Filled 2017-07-08 (×3): qty 1

## 2017-07-08 MED ORDER — PANTOPRAZOLE SODIUM 40 MG PO TBEC
40.0000 mg | DELAYED_RELEASE_TABLET | Freq: Every day | ORAL | Status: DC
  Administered 2017-07-08 – 2017-07-10 (×3): 40 mg via ORAL
  Filled 2017-07-08 (×3): qty 1

## 2017-07-08 MED ORDER — DEXTROSE 50 % IV SOLN
INTRAVENOUS | Status: AC
  Administered 2017-07-08: 25 mL
  Filled 2017-07-08: qty 50

## 2017-07-08 MED ORDER — MIRTAZAPINE 15 MG PO TABS
15.0000 mg | ORAL_TABLET | Freq: Every day | ORAL | Status: DC
  Administered 2017-07-08 – 2017-07-09 (×3): 15 mg via ORAL
  Filled 2017-07-08 (×3): qty 1

## 2017-07-08 MED ORDER — SODIUM CHLORIDE 0.9 % IV BOLUS (SEPSIS)
1000.0000 mL | Freq: Once | INTRAVENOUS | Status: AC
  Administered 2017-07-08: 1000 mL via INTRAVENOUS

## 2017-07-08 MED ORDER — LINAGLIPTIN 5 MG PO TABS
5.0000 mg | ORAL_TABLET | Freq: Every day | ORAL | Status: DC
  Administered 2017-07-08 – 2017-07-10 (×3): 5 mg via ORAL
  Filled 2017-07-08 (×3): qty 1

## 2017-07-08 MED ORDER — HEPARIN SODIUM (PORCINE) 5000 UNIT/ML IJ SOLN
5000.0000 [IU] | Freq: Three times a day (TID) | INTRAMUSCULAR | Status: DC
  Administered 2017-07-08 – 2017-07-10 (×8): 5000 [IU] via SUBCUTANEOUS
  Filled 2017-07-08 (×8): qty 1

## 2017-07-08 MED ORDER — ADULT MULTIVITAMIN W/MINERALS CH
1.0000 | ORAL_TABLET | Freq: Every day | ORAL | Status: DC
Start: 2017-07-08 — End: 2017-07-10
  Administered 2017-07-08 – 2017-07-10 (×3): 1 via ORAL
  Filled 2017-07-08 (×3): qty 1

## 2017-07-08 MED ORDER — INSULIN ASPART 100 UNIT/ML ~~LOC~~ SOLN
0.0000 [IU] | Freq: Three times a day (TID) | SUBCUTANEOUS | Status: DC
  Administered 2017-07-08: 2 [IU] via SUBCUTANEOUS
  Administered 2017-07-09: 7 [IU] via SUBCUTANEOUS
  Administered 2017-07-09: 2 [IU] via SUBCUTANEOUS
  Administered 2017-07-09: 3 [IU] via SUBCUTANEOUS
  Administered 2017-07-10: 5 [IU] via SUBCUTANEOUS
  Administered 2017-07-10: 9 [IU] via SUBCUTANEOUS
  Administered 2017-07-10: 3 [IU] via SUBCUTANEOUS

## 2017-07-08 MED ORDER — SODIUM CHLORIDE 0.9 % IV SOLN: 2.0000 g | Freq: Once | INTRAVENOUS | Status: DC

## 2017-07-08 MED ORDER — METOPROLOL SUCCINATE ER 25 MG PO TB24
25.0000 mg | ORAL_TABLET | Freq: Every day | ORAL | Status: DC
  Administered 2017-07-08 – 2017-07-10 (×3): 25 mg via ORAL
  Filled 2017-07-08 (×3): qty 1

## 2017-07-08 MED ORDER — ALENDRONATE SODIUM 70 MG PO TABS: 70.0000 mg | ORAL_TABLET | ORAL | Status: DC

## 2017-07-08 MED ORDER — DEXTROSE-NACL 5-0.45 % IV SOLN
INTRAVENOUS | Status: DC
  Administered 2017-07-08 – 2017-07-09 (×3): via INTRAVENOUS

## 2017-07-08 MED ORDER — INSULIN ASPART 100 UNIT/ML ~~LOC~~ SOLN
0.0000 [IU] | Freq: Every day | SUBCUTANEOUS | Status: DC
  Administered 2017-07-08: 4 [IU] via SUBCUTANEOUS
  Administered 2017-07-09: 3 [IU] via SUBCUTANEOUS

## 2017-07-08 MED ORDER — HYDRALAZINE HCL 10 MG PO TABS
10.0000 mg | ORAL_TABLET | Freq: Three times a day (TID) | ORAL | Status: DC
  Administered 2017-07-08 – 2017-07-10 (×7): 10 mg via ORAL
  Filled 2017-07-08 (×7): qty 1

## 2017-07-08 NOTE — Progress Notes (Signed)
PROGRESS NOTE    [name]  MLJ:[id number] DOB: 11/14/1940 DOA: 07/07/2017 PCP: Harlan Stains, MD    Brief Narrative:  77 y.o. female with medical history significant of advanced dementia, diabetes, hypertension who is a resident of skilled nursing facility brought in secondary to fever up to 102, worsening mental status as well as white count of 17,000.  Patient was seen in the ER and has met sepsis criteria with evidence of UTI.  She is a full code.  She appears chronically ill looking also.  Family at bedside and indicated that patient's baseline is that of dementia with some mild confusion.  ED Course: Temperature is 101.6, blood pressure 100/59, pulse 113, oxygen sats 96% on room air, white count is 17,000, sodium 158, potassium 4.1, chloride 117, BUN 38, creatinine 1.19, glucose 290.  Assessment & Plan:   Principal Problem:   Sepsis ([place]) Active Problems:   Insulin dependent type 2 diabetes mellitus, uncontrolled (HCC)   Dementia, Alzheimer's, with behavior disturbance   AKI (acute kidney injury) ([place])   Dehydration   GERD without esophagitis   Altered mental state   UTI (urinary tract infection)   Hypernatremia   ARF (acute renal failure) ([place])   #1 Sepsis secondary to UTI:  -Most likely due to acute pyelonephritis below -Patient is continued on empiric IV vancomycin and cefepime -Follow up culture results  #2 UTI:  -UA reviewed, consistent with UTI -Follow culture results -Continue above abx as tolerated  #3 hypernatremia:  -Presenting sodium is 158. -Patient clinically deyhydrated -Continue on IV fluids as tolerated -repeat bmet in AM -Will check sodium later this afternoon.  #4 Toxic metablolic encephalopathy on advanced dementia:  -Patient's baseline is confusion but still able to function.   -Patient is continued on Namenda and Aricept. -Currently with decreased level of mentation, likely related to above UTI  #5 acute kidney injury:  -Most  likely prerenal from dehydration.   -Continue with IVF hydration and repeat bmet in AM  #6 diabetes:  -Poorly controlled at this point.   -patient had been continued on home regimen with sliding scale insulin.  #7 GERD:  -Continue with PPIs as tolerated  #8 altered mental status, toxic metabolic encephalopathy:  -Likely secondary to UTI and worsening dementia as per above  #9 Dehydration -Cliniclaly dehydrated -Will continue on aggressive IVF hydration and repeat bmet in AM    DVT prophylaxis: Heparin subq Code Status: Full code Family Communication: Pt in room, family not at bedside Disposition Plan: Uncertain at this time  Consultants:     Procedures:     Antimicrobials: Anti-infectives (From admission, onward)   Start     Dose/Rate Route Frequency Ordered Stop   07/09/17 0846  ceFEPIme (MAXIPIME) 1 g in sodium chloride 0.9 % 100 mL IVPB     1 g 200 mL/hr over 30 Minutes Intravenous Every 24 hours 07/08/17 0926     07/08/17 0045  ceFEPIme (MAXIPIME) 2 g in sodium chloride 0.9 % 100 mL IVPB  Status:  Discontinued     2 g 200 mL/hr over 30 Minutes Intravenous  Once 07/08/17 0032 07/08/17 0047   07/07/17 2300  ceFEPIme (MAXIPIME) 1 g in sodium chloride 0.9 % 100 mL IVPB  Status:  Discontinued     1 g 200 mL/hr over 30 Minutes Intravenous Every 12 hours 07/07/17 2256 07/08/17 0926   07/07/17 2030  piperacillin-tazobactam (ZOSYN) IVPB 3.375 g     3.375 g 100 mL/hr over 30 Minutes Intravenous  Once  07/07/17 2018 07/07/17 2106   07/07/17 2030  vancomycin (VANCOCIN) IVPB 1000 mg/200 mL premix     1,000 mg 200 mL/hr over 60 Minutes Intravenous  Once 07/07/17 2018 07/07/17 2224       Subjective: Encephalopathic, unable to obtain  Objective: Vitals:   07/08/17 0227 07/08/17 0330 07/08/17 0845 07/08/17 1337  BP: (!) 142/74 (!) 167/64 135/65 125/61  Pulse: 81 87  85  Resp: _0 Temp: 97.6 F (36.4 C) (!) 96.6 F (35.9 C)  (!) 97.5 F (36.4 C)    TempSrc: Oral Axillary  Oral  SpO2: 100%   99%  Weight:      Height:        Intake/Output Summary (Last 24 hours) at 07/08/2017 1404 Last data filed at 07/08/2017 1000 Gross per 24 hour  Intake 3630 ml  Output -  Net 3630 ml   Filed Weights   07/07/17 2008 07/07/17 2327  Weight: 58.5 kg (129 lb) 53.8 kg (118 lb 9.6 oz)    Examination:  General exam: Appears calm and comfortable  Respiratory system: Clear to auscultation. Respiratory effort normal. Cardiovascular system: S1 & S2 heard, RRR Gastrointestinal system: Abdomen is nondistended, soft and nontender. No organomegaly or masses felt. Normal bowel sounds heard. Central nervous system: lethargic,. No focal neurological deficits. Extremities: Symmetric 5 x 5 power. Skin: No rashes, lesions  Psychiatry: confused, unable to assess.   Data Reviewed: I have personally reviewed following labs and imaging studies  CBC: Recent Labs  Lab 07/07/17 2024 07/08/17 0107 07/08/17 0353  WBC 16.8* 14.6* 12.6*  NEUTROABS 10.5* 7.4 6.9  HGB 14.2 12.7 11.7*  HCT 44.9 40.4 37.4  MCV 100.7* 101.3* 101.1*  PLT 258 194 417   Basic Metabolic Panel: Recent Labs  Lab 07/07/17 2024 07/08/17 0107 07/08/17 0353 07/08/17 0825  NA 158* 151* 155* 157*  K 4.1 4.9 3.6 3.7  CL 117* 115* 123* 124*  CO2 _1 GLUCOSE 290* 392* 280* 178*  BUN 38* 37* 34* 32*  CREATININE 1.19* 1.38* 1.19* 1.26*  CALCIUM 9.8 9.1 8.1* 8.3*   GFR: Estimated Creatinine Clearance: 27.8 mL/min (A) (by C-G formula based on SCr of 1.26 mg/dL (H)). Liver Function Tests: Recent Labs  Lab 07/07/17 2024 07/08/17 0107 07/08/17 0353  AST 35 38 30  ALT _2 ALKPHOS 104 84 73  BILITOT 1.3* 1.5* 1.0  PROT 8.1 6.6 5.8*  ALBUMIN 3.5 2.9* 2.5*   No results for input(s): LIPASE, AMYLASE in the last 168 hours. No results for input(s): AMMONIA in the last 168 hours. Coagulation Profile: Recent Labs  Lab 07/07/17 2024 07/08/17 0107  INR 1.04 1.06    Cardiac Enzymes: No results for input(s): CKTOTAL, CKMB, CKMBINDEX, TROPONINI in the last 168 hours. BNP (last 3 results) No results for input(s): PROBNP in the last 8760 hours. HbA1C: No results for input(s): HGBA1C in the last 72 hours. CBG: Recent Labs  Lab 07/08/17 0113 07/08/17 0744 07/08/17 1134  GLUCAP 348* 182* 110*   Lipid Profile: No results for input(s): CHOL, HDL, LDLCALC, TRIG, CHOLHDL, LDLDIRECT in the last 72 hours. Thyroid Function Tests: No results for input(s): TSH, T4TOTAL, FREET4, T3FREE, THYROIDAB in the last 72 hours. Anemia Panel: No results for input(s): VITAMINB12, FOLATE, FERRITIN, TIBC, IRON, RETICCTPCT in the last 72 hours. Sepsis Labs: Recent Labs  Lab 07/07/17 2026 07/07/17 2224 07/08/17 0107 07/08/17 0359  PROCALCITON  --   --  <0.10  --  LATICACIDVEN 2.01* 1.73 1.6 2.1*    Recent Results (from the past 240 hour(s))  Culture, blood (Routine x 2)     Status: None (Preliminary result)   Collection Time: 07/07/17  8:24 PM  Result Value Ref Range Status   Specimen Description   Final    BLOOD RIGHT ANTECUBITAL Performed at [facility] [address] [place], [place] [address]    Special Requests   Final    BOTTLES DRAWN AEROBIC AND ANAEROBIC Blood Culture results may not be optimal due to an excessive volume of blood received in culture bottles Performed at [facility] [address], [place], [place] [address]    Culture   Final    NO GROWTH < 12 HOURS Performed at [facility] [address], [place], [place] [address]    Report Status PENDING  Incomplete  Culture, blood (Routine x 2)     Status: None (Preliminary result)   Collection Time: 07/07/17  8:26 PM  Result Value Ref Range Status   Specimen Description   Final    BLOOD LEFT ANTECUBITAL Performed at [facility] [address] [place], [place] [address]    Special Requests   Final    BOTTLES DRAWN  AEROBIC AND ANAEROBIC Blood Culture results may not be optimal due to an excessive volume of blood received in culture bottles Performed at [facility] [address], [place], [place] [address]    Culture   Final    NO GROWTH < 12 HOURS Performed at [facility] [address], [place], [place] [address]    Report Status PENDING  Incomplete     Radiology Studies: Dg Chest 2 View  Result Date: 07/07/2017 CLINICAL DATA:  Abnormal labs and fever. Elevated white cell count. Weakness and lethargy for 2 days. History of dementia. EXAM: CHEST - 2 VIEW COMPARISON:  04/24/2017 FINDINGS: Shallow inspiration with linear atelectasis or fibrosis in the lung bases. This is similar to previous study. No airspace disease or consolidation in the lungs. No blunting of costophrenic angles. No pneumothorax. Mediastinal contours appear intact. Heart size and pulmonary vascularity are normal. IMPRESSION: Shallow inspiration with linear atelectasis or fibrosis in the lung bases. No focal consolidation. Electronically Signed   By: Lucienne Capers M.D.   On: 07/07/2017 21:20    Scheduled Meds: . amLODipine  5 mg Oral Daily  . calcium-vitamin D  1 tablet Oral Daily  . donepezil  5 mg Oral QHS  . feeding supplement (ENSURE ENLIVE)  237 mL Oral BID BM  . heparin  5,000 Units Subcutaneous Q8H  . hydrALAZINE  10 mg Oral TID  . insulin aspart  0-5 Units Subcutaneous QHS  . insulin aspart  0-9 Units Subcutaneous TID WC  . insulin aspart  6 Units Subcutaneous TID WC  . insulin glargine  20 Units Subcutaneous Q2200  . linagliptin  5 mg Oral Daily  . memantine  28 mg Oral Daily  . metoprolol succinate  25 mg Oral Daily  . mirtazapine  15 mg Oral QHS  . multivitamin with minerals  1 tablet Oral Daily  . pantoprazole  40 mg Oral Daily   Continuous Infusions: . [START ON 07/09/2017] ceFEPime (MAXIPIME) IV    . lactated ringers 125 mL/hr at 07/08/17 1253     LOS: 1 day   Marylu Lund,  MD [facility] Pager [phone]  If 7PM-7AM, please contact night-coverage [email or web address] Password [facility] 07/08/2017,  2:04 PM

## 2017-07-08 NOTE — Evaluation (Signed)
Clinical/Bedside Swallow Evaluation Patient Details  Name: [name] MRN: [id number] Date of Birth: [birth year]  Today's Date: 07/08/2017 Time: SLP Start Time (ACUTE ONLY): 7371 SLP Stop Time (ACUTE ONLY): 1652 SLP Time Calculation (min) (ACUTE ONLY): 16 min  Past Medical History:  Past Medical History:  Diagnosis Date  . 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 Anxiety   . Blepharitis   . Dementia   . Depression   . Diabetes mellitus   . Essential hypertension 01/12/2009   Qualifier: Diagnosis of  By: Ernestina Patches MD, Remo Lipps    . GERD (gastroesophageal reflux disease)   . Heart murmur, systolic   . Hepatitis   . Hyperlipidemia   . Hypertension   . Osteoporosis   . Urinary frequency    Past Surgical History:  Past Surgical History:  Procedure Laterality Date  . COLONOSCOPY N/A 02/16/2012   Procedure: COLONOSCOPY;  Surgeon: Winfield Cunas., MD;  Location: [facility] ENDOSCOPY;  Service: Endoscopy;  Laterality: N/A;  . ESOPHAGOGASTRODUODENOSCOPY N/A 02/16/2012   Procedure: ESOPHAGOGASTRODUODENOSCOPY (EGD);  Surgeon: Winfield Cunas., MD;  Location: [facility] ENDOSCOPY;  Service: Endoscopy;  Laterality: N/A;  . THYROIDECTOMY     in 2000   HPI:  77 y.o. female with medical history significant of advanced dementia, diabetes, hypertension who is a resident of skilled nursing facility brought in secondary to fever up to 102, worsening mental status as well as white count of 17,000.  Patient was seen in the ER on 07/07/17 and met sepsis criteria with evidence of UTI.  She is a full code.  She appears chronically ill looking also.  Family at bedside and indicated that patient's baseline is that of dementia with some mild confusion. CXR on 07/07/17 indicated Shallow inspiration with linear atelectasis or fibrosis in the lung bases. No focal consolidation  Assessment / Plan / Recommendation Clinical Impression   Pt with mild cognitive-based oropharyngeal dysphagia characterized by oral holding,  prolonged oral transit (with solids), impaired mastication and suspected delay in the initiation of the swallow which places pt at mild risk for aspiration d/t cognitive impairment/hx of dysphagia (pocketing per daughter/son); no overt s/s of aspiration noted during BSE; nursing stated pt required oral suctioning after earlier meal d/t oral residue; recommend Dysphagia 1 (puree) diet with thin liquids provided via cup/straw with moderate verbal/tactile cues provided to swallow prior to subsequent swallows and consume PO's with slow rate/small bites and sips to reduce mild aspiration risk during PO consumption; ST will f/u for diet tolerance and education for swallowing/aspiration precautions during acute stay; thank you for this consult. SLP Visit Diagnosis: Dysphagia, oropharyngeal phase (R13.12)    Aspiration Risk  Mild aspiration risk    Diet Recommendation   Dysphagia 1 (puree)/thin liquids  Medication Administration: Whole meds with puree    Other  Recommendations Oral Care Recommendations: Oral care BID   Follow up Recommendations Skilled Nursing facility      Frequency and Duration min 1 x/week  1 week       Prognosis Prognosis for Safe Diet Advancement: Good Barriers to Reach Goals: Cognitive deficits      Swallow Study   General Date of Onset: 07/07/17 HPI: 77 y.o. female with medical history significant of advanced dementia, diabetes, hypertension who is a resident of skilled nursing facility brought in secondary to fever up to 102, worsening mental status as well as white count of 17,000.  Patient was seen in the ER and has met sepsis criteria with evidence of UTI.  She is a full code.  She appears chronically ill looking also.  Family at bedside and indicated that patient's baseline is that of dementia with some mild confusion. Type of Study: Bedside Swallow Evaluation Previous Swallow Assessment: none noted Diet Prior to this Study: NPO Temperature Spikes Noted:  No Respiratory Status: Room air History of Recent Intubation: No Behavior/Cognition: Alert;Confused;Pleasant mood Oral Cavity Assessment: Within Functional Limits Oral Care Completed by SLP: No Oral Cavity - Dentition: Edentulous Vision: Functional for self-feeding Self-Feeding Abilities: Able to feed self;Needs assist Patient Positioning: Upright in bed Baseline Vocal Quality: Normal Volitional Cough: Cognitively unable to elicit Volitional Swallow: Unable to elicit    Oral/Motor/Sensory Function Overall Oral Motor/Sensory Function: Other (comment)(DTA d/t cognitive status)   Ice Chips Ice chips: Impaired Presentation: Spoon Oral Phase Functional Implications: Oral holding Pharyngeal Phase Impairments: Suspected delayed Swallow   Thin Liquid Thin Liquid: Impaired Presentation: Straw;Cup Oral Phase Functional Implications: Oral holding Pharyngeal  Phase Impairments: Suspected delayed Swallow    Nectar Thick Nectar Thick Liquid: Not tested   Honey Thick Honey Thick Liquid: Not tested   Puree Puree: Impaired Presentation: Spoon Oral Phase Functional Implications: Oral holding Pharyngeal Phase Impairments: Suspected delayed Swallow   Solid      Solid: Impaired Presentation: Spoon Oral Phase Impairments: Impaired mastication;Reduced lingual movement/coordination Oral Phase Functional Implications: Prolonged oral transit;Impaired mastication;Oral holding Pharyngeal Phase Impairments: Suspected delayed Swallow        Elvina Sidle, M.S., CCC-SLP 07/08/2017,5:05 PM

## 2017-07-08 NOTE — Progress Notes (Signed)
CRITICAL VALUE ALERT  Critical Value:  Lactic acid 2.1  Date & Time Notied:  07/08/17 0504  Provider Notified: Rana SnareBodenheimer  Orders Received/Actions taken:

## 2017-07-08 NOTE — Progress Notes (Signed)
Pharmacy Antibiotic Note  [name] is a 77 y.o. female admitted on 07/07/2017 with sepsis and UTI.  Pharmacy has been consulted for Cefepime dosing.  Plan: For CrCl < 30 ml/min, change cefepime from 1g q12 to 1g q24 Will continue to monitor  Height: 5\' 1"  (154.9 cm) Weight: 118 lb 9.6 oz (53.8 kg) IBW/kg (Calculated) : 47.8  Temp (24hrs), Avg:98.4 F (36.9 C), Min:96.6 F (35.9 C), Max:101.6 F (38.7 C)  Recent Labs  Lab 07/07/17 2024 07/07/17 2026 07/07/17 2224 07/08/17 0107 07/08/17 0353 07/08/17 0359 07/08/17 0825  WBC 16.8*  --   --  14.6* 12.6*  --   --   CREATININE 1.19*  --   --  1.38* 1.19*  --  1.26*  LATICACIDVEN  --  2.01* 1.73 1.6  --  2.1*  --     Estimated Creatinine Clearance: 27.8 mL/min (A) (by C-G formula based on SCr of 1.26 mg/dL (H)).    Allergies  Allergen Reactions  . Losartan Swelling    angioedema    Antimicrobials this admission: 7/5 vanco x1 7/5 zosyn x1 7/5 Cefepime >>   Dose adjustments this admission: -  Microbiology results: -  Thank you for allowing pharmacy to be a part of this patient's care.  Hessie KnowsJustin M Kristopher Delk, PharmD, BCPS Pager [phone] 07/08/2017 9:25 AM

## 2017-07-09 LAB — URINE CULTURE: Special Requests: NORMAL

## 2017-07-09 LAB — BASIC METABOLIC PANEL
ANION GAP: 7 (ref 5–15)
BUN: 20 mg/dL (ref 8–23)
CALCIUM: 8.2 mg/dL — AB (ref 8.9–10.3)
CO2: 23 mmol/L (ref 22–32)
Chloride: 121 mmol/L — ABNORMAL HIGH (ref 98–111)
Creatinine, Ser: 0.86 mg/dL (ref 0.44–1.00)
GFR calc Af Amer: >60 mL/min (ref >60)
Glucose, Bld: 199 mg/dL — ABNORMAL HIGH (ref 70–99)
POTASSIUM: 3.1 mmol/L — AB (ref 3.5–5.1)
SODIUM: 151 mmol/L — AB (ref 135–145)

## 2017-07-09 LAB — CBC
HEMATOCRIT: 36.6 % (ref 36.0–46.0)
HEMOGLOBIN: 11.4 g/dL — AB (ref 12.0–15.0)
MCH: 31.3 pg (ref 26.0–34.0)
MCHC: 31.1 g/dL (ref 30.0–36.0)
MCV: 100.5 fL — ABNORMAL HIGH (ref 78.0–100.0)
Platelets: 156 10*3/uL (ref 150–400)
RBC: 3.64 MIL/uL — ABNORMAL LOW (ref 3.87–5.11)
RDW: 12.9 % (ref 11.5–15.5)
WBC: 9.9 10*3/uL (ref 4.0–10.5)

## 2017-07-09 LAB — GLUCOSE, CAPILLARY
GLUCOSE-CAPILLARY: 165 mg/dL — AB (ref 70–99)
GLUCOSE-CAPILLARY: 241 mg/dL — AB (ref 70–99)
GLUCOSE-CAPILLARY: 259 mg/dL — AB (ref 70–99)
Glucose-Capillary: 320 mg/dL — ABNORMAL HIGH (ref 70–99)

## 2017-07-09 LAB — SODIUM: SODIUM: 150 mmol/L — AB (ref 135–145)

## 2017-07-09 MED ORDER — DEXTROSE 5 % IV SOLN
INTRAVENOUS | Status: DC
  Administered 2017-07-09 – 2017-07-10 (×2): via INTRAVENOUS

## 2017-07-09 MED ORDER — SODIUM CHLORIDE 0.9 % IV SOLN
1.0000 g | Freq: Two times a day (BID) | INTRAVENOUS | Status: DC
  Administered 2017-07-09 – 2017-07-10 (×3): 1 g via INTRAVENOUS
  Filled 2017-07-09 (×3): qty 1

## 2017-07-09 MED ORDER — ACETAMINOPHEN 325 MG PO TABS
650.0000 mg | ORAL_TABLET | Freq: Four times a day (QID) | ORAL | Status: DC | PRN
  Administered 2017-07-09: 650 mg via ORAL
  Filled 2017-07-09: qty 2

## 2017-07-09 MED ORDER — POTASSIUM CHLORIDE 20 MEQ PO PACK
40.0000 meq | PACK | Freq: Once | ORAL | Status: AC
  Administered 2017-07-09: 40 meq via ORAL
  Filled 2017-07-09: qty 2

## 2017-07-09 MED ORDER — INSULIN ASPART 100 UNIT/ML ~~LOC~~ SOLN
5.0000 [IU] | Freq: Three times a day (TID) | SUBCUTANEOUS | Status: DC
  Administered 2017-07-09 – 2017-07-10 (×4): 5 [IU] via SUBCUTANEOUS

## 2017-07-09 MED ORDER — INSULIN GLARGINE 100 UNIT/ML ~~LOC~~ SOLN
10.0000 [IU] | Freq: Every day | SUBCUTANEOUS | Status: DC
  Filled 2017-07-09: qty 0.1

## 2017-07-09 MED ORDER — INSULIN GLARGINE 100 UNIT/ML ~~LOC~~ SOLN
20.0000 [IU] | Freq: Every day | SUBCUTANEOUS | Status: DC
  Administered 2017-07-09: 20 [IU] via SUBCUTANEOUS
  Filled 2017-07-09 (×2): qty 0.2

## 2017-07-09 NOTE — Progress Notes (Signed)
Pt had an uneventful day. More alert than reported by off going RN. She is pleasantly confused. Feed her all 3 meals today and pt ate 100%. No holding or pocketing food noted. Spoke with family visitors today.   Cont with plan of care

## 2017-07-09 NOTE — Progress Notes (Addendum)
Pharmacy Antibiotic Note  [name] is a 77 y.o. female admitted on 07/07/2017 with sepsis and UTI.  Pharmacy has been consulted for Cefepime dosing.  Plan: SCr improved - For CrCl > 30 ml/min, change cefepime from 1g q24 tp 1g IV q12 Will continue to monitor  Height: 5\' 1"  (154.9 cm) Weight: 118 lb 9.6 oz (53.8 kg) IBW/kg (Calculated) : 47.8  Temp (24hrs), Avg:98.5 F (36.9 C), Min:97.5 F (36.4 C), Max:99.2 F (37.3 C)  Recent Labs  Lab 07/07/17 2024 07/07/17 2026 07/07/17 2224 07/08/17 0107 07/08/17 0353 07/08/17 0359 07/08/17 0825 07/09/17 0414  WBC 16.8*  --   --  14.6* 12.6*  --   --  9.9  CREATININE 1.19*  --   --  1.38* 1.19*  --  1.26* 0.86  LATICACIDVEN  --  2.01* 1.73 1.6  --  2.1*  --   --     Estimated Creatinine Clearance: 40.7 mL/min (by C-G formula based on SCr of 0.86 mg/dL).    Allergies  Allergen Reactions  . Losartan Swelling    angioedema    Antimicrobials this admission: 7/5 vanco x1 7/5 zosyn x1 7/5 Cefepime >>   Microbiology results: 7/5 BCx: ngtd 7/5 UCx: sent 7/5 MRSA PCR: negative  Thank you for allowing pharmacy to be a part of this patient's care.  Hessie KnowsJustin M Keyri Salberg, PharmD, BCPS Pager [phone] 07/09/2017 8:39 AM

## 2017-07-09 NOTE — Progress Notes (Signed)
PROGRESS NOTE    [name]  YHC:[id number] DOB: [birth year] DOA: 07/07/2017 PCP: Harlan Stains, MD    Brief Narrative:  77 y.o. female with medical history significant of advanced dementia, diabetes, hypertension who is a resident of skilled nursing facility brought in secondary to fever up to 102, worsening mental status as well as white count of 17,000.  Patient was seen in the ER and has met sepsis criteria with evidence of UTI.  She is a full code.  She appears chronically ill looking also.  Family at bedside and indicated that patient's baseline is that of dementia with some mild confusion.  ED Course: Temperature is 101.6, blood pressure 100/59, pulse 113, oxygen sats 96% on room air, white count is 17,000, sodium 158, potassium 4.1, chloride 117, BUN 38, creatinine 1.19, glucose 290.  Assessment & Plan:   Principal Problem:   Sepsis ([place]) Active Problems:   Insulin dependent type 2 diabetes mellitus, uncontrolled (H[facility])   Dementia, Alzheimer's, with behavior disturbance   AKI (acute kidney injury) ([place])   Dehydration   GERD without esophagitis   Altered mental state   UTI (urinary tract infection)   Hypernatremia   ARF (acute renal failure) ([place])   #1 Sepsis secondary to UTI:  -Most likely due to acute pyelonephritis as per below -Patient was initially continued on empiric IV vancomycin and cefepime -blood cx neg. Urine cx with multiple species present -abx has been narrowed to cefepime  #2 UTI:  -UA reviewed, consistent with UTI -Urine cx per above -Continue above abx as tolerated  #3 hypernatremia:  -Presenting sodium is 158. -Patient clinically deyhydrated -sodium now improving with D5 1/2 NS -Mentation improving -Will repeat sodium this afternoon, change IVF accordingly  #4 Toxic metablolic encephalopathy on advanced dementia:  -Patient's baseline is confusion but still able to function.   -Patient is continued on Namenda and Aricept. -Currently  with decreased level of mentation, likely related to above UTI  #5 acute kidney injury:  -Most likely prerenal from dehydration.   -Improving. Labs reviewed -Continue IVF per above -repeat bmet in AM  #6 diabetes:  -Poorly controlled at presentation -Will continue on 20 units lantus with 5 units meal coverage, continue to titrate as tolerated -Continue SSI coverage  #7 GERD:  -Will continue with PPIs as tolerated  #8 altered mental status, toxic metabolic encephalopathy:  -Likely secondary to UTI and worsening dementia as per above -Mental status is improving. Patient more conversant  #9 Dehydration -Presented cliniclaly dehydrated -Seems improving with IVF hydration -Continue diet as tolerated   DVT prophylaxis: Heparin subq Code Status: Full code Family Communication: Pt in room, family not at bedside Disposition Plan: Uncertain at this time  Consultants:     Procedures:     Antimicrobials: Anti-infectives (From admission, onward)   Start     Dose/Rate Route Frequency Ordered Stop   07/09/17 1000  ceFEPIme (MAXIPIME) 1 g in sodium chloride 0.9 % 100 mL IVPB     1 g 200 mL/hr over 30 Minutes Intravenous Every 12 hours 07/09/17 0841     07/09/17 0846  ceFEPIme (MAXIPIME) 1 g in sodium chloride 0.9 % 100 mL IVPB  Status:  Discontinued     1 g 200 mL/hr over 30 Minutes Intravenous Every 24 hours 07/08/17 0926 07/09/17 0841   07/08/17 0045  ceFEPIme (MAXIPIME) 2 g in sodium chloride 0.9 % 100 mL IVPB  Status:  Discontinued     2 g 200 mL/hr over 30 Minutes Intravenous  Once 07/08/17 0032 07/08/17 0047   07/07/17 2300  ceFEPIme (MAXIPIME) 1 g in sodium chloride 0.9 % 100 mL IVPB  Status:  Discontinued     1 g 200 mL/hr over 30 Minutes Intravenous Every 12 hours 07/07/17 2256 07/08/17 0926   07/07/17 2030  piperacillin-tazobactam (ZOSYN) IVPB 3.375 g     3.375 g 100 mL/hr over 30 Minutes Intravenous  Once 07/07/17 2018 07/07/17 2106   07/07/17 2030  vancomycin  (VANCOCIN) IVPB 1000 mg/200 mL premix     1,000 mg 200 mL/hr over 60 Minutes Intravenous  Once 07/07/17 2018 07/07/17 2224      Subjective: Without complaints  Objective: Vitals:   07/08/17 2218 07/09/17 0525 07/09/17 0908 07/09/17 0913  BP: (!) 141/66 (!) 150/82 138/66 138/66  Pulse: 91 89 79 77  Resp:  16    Temp: 99.2 F (37.3 C) 98.7 F (37.1 C)    TempSrc: Oral Oral    SpO2: 97% 100%  100%  Weight:      Height:        Intake/Output Summary (Last 24 hours) at 07/09/2017 1330 Last data filed at 07/09/2017 0630 Gross per 24 hour  Intake 1566.67 ml  Output 1250 ml  Net 316.67 ml   Filed Weights   07/07/17 2008 07/07/17 2327  Weight: 58.5 kg (129 lb) 53.8 kg (118 lb 9.6 oz)    Examination: General exam: Conversant, in no acute distress Respiratory system: normal chest rise, clear, no audible wheezing Cardiovascular system: regular rhythm, s1-s2 Gastrointestinal system: Nondistended, nontender, pos BS Central nervous system: No seizures, no tremors Extremities: No cyanosis, no joint deformities Skin: No rashes, no pallor Psychiatry: conversant, confused  Data Reviewed: I have personally reviewed following labs and imaging studies  CBC: Recent Labs  Lab 07/07/17 2024 07/08/17 0107 07/08/17 0353 07/09/17 0414  WBC 16.8* 14.6* 12.6* 9.9  NEUTROABS 10.5* 7.4 6.9  --   HGB 14.2 12.7 11.7* 11.4*  HCT 44.9 40.4 37.4 36.6  MCV 100.7* 101.3* 101.1* 100.5*  PLT 258 194 174 400   Basic Metabolic Panel: Recent Labs  Lab 07/07/17 2024 07/08/17 0107 07/08/17 0353 07/08/17 0825 07/08/17 1510 07/08/17 1945 07/09/17 0414  NA 158* 151* 155* 157* 157* 156* 151*  K 4.1 4.9 3.6 3.7  --   --  3.1*  CL 117* 115* 123* 124*  --   --  121*  CO2 _0 --   --  23  GLUCOSE 290* 392* 280* 178*  --   --  199*  BUN 38* 37* 34* 32*  --   --  20  CREATININE 1.19* 1.38* 1.19* 1.26*  --   --  0.86  CALCIUM 9.8 9.1 8.1* 8.3*  --   --  8.2*   GFR: Estimated Creatinine  Clearance: 40.7 mL/min (by C-G formula based on SCr of 0.86 mg/dL). Liver Function Tests: Recent Labs  Lab 07/07/17 2024 07/08/17 0107 07/08/17 0353  AST 35 38 30  ALT _1 ALKPHOS 104 84 73  BILITOT 1.3* 1.5* 1.0  PROT 8.1 6.6 5.8*  ALBUMIN 3.5 2.9* 2.5*   No results for input(s): LIPASE, AMYLASE in the last 168 hours. No results for input(s): AMMONIA in the last 168 hours. Coagulation Profile: Recent Labs  Lab 07/07/17 2024 07/08/17 0107  INR 1.04 1.06   Cardiac Enzymes: No results for input(s): CKTOTAL, CKMB, CKMBINDEX, TROPONINI in the last 168 hours. BNP (last 3 results) No results for input(s): PROBNP in  the last 8760 hours. HbA1C: No results for input(s): HGBA1C in the last 72 hours. CBG: Recent Labs  Lab 07/08/17 1614 07/08/17 1700 07/08/17 2228 07/09/17 0801 07/09/17 1149  GLUCAP 66* 105* 187* 165* 241*   Lipid Profile: No results for input(s): CHOL, HDL, LDLCALC, TRIG, CHOLHDL, LDLDIRECT in the last 72 hours. Thyroid Function Tests: No results for input(s): TSH, T4TOTAL, FREET4, T3FREE, THYROIDAB in the last 72 hours. Anemia Panel: No results for input(s): VITAMINB12, FOLATE, FERRITIN, TIBC, IRON, RETICCTPCT in the last 72 hours. Sepsis Labs: Recent Labs  Lab 07/07/17 2026 07/07/17 2224 07/08/17 0107 07/08/17 0359  PROCALCITON  --   --  <0.10  --   LATICACIDVEN 2.01* 1.73 1.6 2.1*    Recent Results (from the past 240 hour(s))  Culture, blood (Routine x 2)     Status: None (Preliminary result)   Collection Time: 07/07/17  8:24 PM  Result Value Ref Range Status   Specimen Description   Final    BLOOD RIGHT ANTECUBITAL Performed at [facility], [place] [address] [place], [place] [address]    Special Requests   Final    BOTTLES DRAWN AEROBIC AND ANAEROBIC Blood Culture results may not be optimal due to an excessive volume of blood received in culture bottles Performed at [place] [address], [place], [place] [address]    Culture   Final    NO GROWTH 2 DAYS Performed at [place] [address], [place], [place] [address]    Report Status PENDING  Incomplete  Culture, blood (Routine x 2)     Status: None (Preliminary result)   Collection Time: 07/07/17  8:26 PM  Result Value Ref Range Status   Specimen Description   Final    BLOOD LEFT ANTECUBITAL Performed at [place] [address], [place], [place] [address]    Special Requests   Final    BOTTLES DRAWN AEROBIC AND ANAEROBIC Blood Culture results may not be optimal due to an excessive volume of blood received in culture bottles Performed at [place] [address], [place], [place] [address]    Culture   Final    NO GROWTH 2 DAYS Performed at [place] [address], [place], [place] [address]    Report Status PENDING  Incomplete  Urine culture     Status: Abnormal   Collection Time: 07/07/17  8:26 PM  Result Value Ref Range Status   Specimen Description   Final    URINE, CATHETERIZED Performed at [facility] [address], [place], [place] [address]    Special Requests   Final    Normal Performed at [facility], [place] [address], [place], [place] [address]    Culture MULTIPLE SPECIES PRESENT, SUGGEST RECOLLECTION (A)  Final   Report Status 07/09/2017 FINAL  Final  MRSA PCR Screening     Status: None   Collection Time: 07/08/17  6:25 PM  Result Value Ref Range Status   MRSA by PCR NEGATIVE NEGATIVE Final    Comment:        The GeneXpert MRSA Assay (FDA approved for NASAL specimens only), is one component of a comprehensive MRSA colonization surveillance program. It is not intended to diagnose MRSA infection nor to guide or monitor treatment for MRSA infections. Performed at [facility], [place] [address], [place], [place] [address]      Radiology Studies: Dg Chest  2  View  Result Date: 07/07/2017 CLINICAL DATA:  Abnormal labs and fever. Elevated white cell count. Weakness and lethargy for 2 days. History of dementia. EXAM: CHEST - 2 VIEW COMPARISON:  04/24/2017 FINDINGS: Shallow inspiration with linear atelectasis or fibrosis in the lung bases. This is similar to previous study. No airspace disease or consolidation in the lungs. No blunting of costophrenic angles. No pneumothorax. Mediastinal contours appear intact. Heart size and pulmonary vascularity are normal. IMPRESSION: Shallow inspiration with linear atelectasis or fibrosis in the lung bases. No focal consolidation. Electronically Signed   By: Lucienne Capers M.D.   On: 07/07/2017 21:20    Scheduled Meds: . amLODipine  5 mg Oral Daily  . calcium-vitamin D  1 tablet Oral Daily  . donepezil  5 mg Oral QHS  . feeding supplement (ENSURE ENLIVE)  237 mL Oral BID BM  . heparin  5,000 Units Subcutaneous Q8H  . hydrALAZINE  10 mg Oral TID  . insulin aspart  0-5 Units Subcutaneous QHS  . insulin aspart  0-9 Units Subcutaneous TID WC  . insulin glargine  10 Units Subcutaneous Q2200  . linagliptin  5 mg Oral Daily  . memantine  28 mg Oral Daily  . metoprolol succinate  25 mg Oral Daily  . mirtazapine  15 mg Oral QHS  . multivitamin with minerals  1 tablet Oral Daily  . pantoprazole  40 mg Oral Daily   Continuous Infusions: . ceFEPime (MAXIPIME) IV Stopped (07/09/17 0939)  . dextrose 5 % and 0.45% NaCl 125 mL/hr at 07/09/17 1306     LOS: 2 days   Marylu Lund, MD [facility] Pager [phone]  If 7PM-7AM, please contact night-coverage [email or web address] Password [email or web address] 07/09/2017, 1:30 PM

## 2017-07-10 DIAGNOSIS — A419 Sepsis, unspecified organism: Principal | ICD-10-CM

## 2017-07-10 DIAGNOSIS — N390 Urinary tract infection, site not specified: Secondary | ICD-10-CM

## 2017-07-10 DIAGNOSIS — E870 Hyperosmolality and hypernatremia: Secondary | ICD-10-CM

## 2017-07-10 LAB — BASIC METABOLIC PANEL
Anion gap: 4 — ABNORMAL LOW (ref 5–15)
BUN: 15 mg/dL (ref 8–23)
CO2: 25 mmol/L (ref 22–32)
Calcium: 8.1 mg/dL — ABNORMAL LOW (ref 8.9–10.3)
Chloride: 115 mmol/L — ABNORMAL HIGH (ref 98–111)
Creatinine, Ser: 0.78 mg/dL (ref 0.44–1.00)
GFR calc Af Amer: >60 mL/min (ref >60)
GFR calc non Af Amer: >60 mL/min (ref >60)
GLUCOSE: 202 mg/dL — AB (ref 70–99)
POTASSIUM: 3.4 mmol/L — AB (ref 3.5–5.1)
Sodium: 144 mmol/L (ref 135–145)

## 2017-07-10 LAB — GLUCOSE, CAPILLARY
GLUCOSE-CAPILLARY: 218 mg/dL — AB (ref 70–99)
Glucose-Capillary: 352 mg/dL — ABNORMAL HIGH (ref 70–99)
Glucose-Capillary: 280 mg/dL — ABNORMAL HIGH (ref 70–99)

## 2017-07-10 MED ORDER — POTASSIUM CHLORIDE 20 MEQ PO PACK
40.0000 meq | PACK | Freq: Once | ORAL | Status: AC
  Administered 2017-07-10: 40 meq via ORAL
  Filled 2017-07-10 (×2): qty 2

## 2017-07-10 MED ORDER — CEFDINIR 300 MG PO CAPS
300.0000 mg | ORAL_CAPSULE | Freq: Two times a day (BID) | ORAL | Status: DC
  Filled 2017-07-10: qty 1

## 2017-07-10 MED ORDER — CEFDINIR 300 MG PO CAPS: 300.0000 mg | ORAL_CAPSULE | Freq: Two times a day (BID) | ORAL | 0 refills | Status: AC

## 2017-07-10 NOTE — NC FL2 (Signed)
[place] MEDICAID FL2 LEVEL OF CARE SCREENING TOOL     IDENTIFICATION  Patient Name: [name] Birthdate: 1940/12/27 Sex: female Admission Date (Current Location): 07/07/2017  [facility] and [place] Number:  Producer, television/film/video and Address:  [facility],  [address] [place], [place] [address]      Provider Number: [id number]  Attending Physician Name and Address:  Jerald Kief, MD  Relative Name and Phone Number:       Current Level of Care: Hospital Recommended Level of Care: Skilled Nursing Facility Prior Approval Number:    Date Approved/Denied:   PASRR Number: [id number]  Discharge Plan: SNF    Current Diagnoses: Patient Active Problem List   Diagnosis Date Noted  . Hypernatremia 07/08/2017  . ARF (acute renal failure) (HCC) 07/08/2017  . Altered mental state 07/07/2017  . Sepsis (HCC) 07/07/2017  . UTI (urinary tract infection) 07/07/2017  . 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 GERD without esophagitis 05/01/2017  . AKI (acute kidney injury) (HCC) 04/24/2017  . Dehydration 04/24/2017  . Acute encephalopathy 04/24/2017  . Angioedema 04/22/2017  . HCAP (healthcare-associated pneumonia) 03/10/2017  . Anemia 02/14/2012  . Blepharitis of left upper eyelid 04/25/2011  . Cough 12/30/2010  . Right ear pain 11/20/2010  . Urinary frequency 11/20/2010  . Pain in gums 06/07/2010  . Systolic murmur 03/18/2010  . Right thigh pain 03/18/2010  . Urge incontinence 03/18/2010  . Dementia, Alzheimer's, with behavior disturbance 09/30/2009  . Osteoporosis 09/21/2009  . CONSTIPATION 05/15/2009  . Insulin dependent type 2 diabetes mellitus, uncontrolled (HCC) 01/12/2009  . HYPERLIPIDEMIA 01/12/2009  . ANXIETY 01/12/2009  . Depression, major, single episode, in partial remission (HCC) 01/12/2009  . Essential hypertension 01/12/2009  . THYROIDECTOMY, HX OF 01/12/2009    Orientation RESPIRATION BLADDER Height & Weight     Self  Normal  Incontinent Weight: 118 lb 9.6 oz (53.8 kg) Height:  5\' 1"  (154.9 cm)  BEHAVIORAL SYMPTOMS/MOOD NEUROLOGICAL BOWEL NUTRITION STATUS      Incontinent Diet(see dc summary)  AMBULATORY STATUS COMMUNICATION OF NEEDS Skin   Total Care Verbally Normal                       Personal Care Assistance Level of Assistance  Bathing, Feeding, Dressing Bathing Assistance: Maximum assistance Feeding assistance: Limited assistance Dressing Assistance: Maximum assistance     Functional Limitations Info  Sight, Hearing, Speech Sight Info: Adequate Hearing Info: Adequate Speech Info: Adequate    SPECIAL CARE FACTORS FREQUENCY  PT (By licensed PT)     PT Frequency: Min 2x/week              Contractures      Additional Factors Info  Code Status, Allergies Code Status Info: Full Code Allergies Info: Losartan           Current Medications (07/10/2017):  This is the current hospital active medication list Current Facility-Administered Medications  Medication Dose Route Frequency Provider Last Rate Last Dose  . acetaminophen (TYLENOL) tablet 650 mg  650 mg Oral Q6H PRN Pearson Grippe, MD   650 mg at 07/09/17 2144  . amLODipine (NORVASC) tablet 5 mg  5 mg Oral Daily Earlie Lou L, MD   5 mg at 07/10/17 1011  . calcium-vitamin D (OSCAL WITH D) 500-200 MG-UNIT per tablet 1 tablet  1 tablet Oral Daily Rometta [place], MD   1 tablet at 07/10/17 1011  . cefdinir (OMNICEF) capsule 300 mg  300 mg Oral  Q12H Jerald Kiefhiu, Stephen K, MD      . donepezil (ARICEPT) tablet 5 mg  5 mg Oral QHS Rometta EmeryGarba, Mohammad L, MD   5 mg at 07/09/17 2144  . feeding supplement (ENSURE ENLIVE) (ENSURE ENLIVE) liquid 237 mL  237 mL Oral BID BM Jerald Kiefhiu, Stephen K, MD   237 mL at 07/10/17 1441  . heparin injection 5,000 Units  5,000 Units Subcutaneous Q8H Earlie LouGarba, Mohammad L, MD   5,000 Units at 07/10/17 1441  . hydrALAZINE (APRESOLINE) tablet 10 mg  10 mg Oral TID Rometta EmeryGarba, Mohammad L, MD   10 mg at 07/10/17 1012  . insulin aspart  (novoLOG) injection 0-5 Units  0-5 Units Subcutaneous QHS Rometta EmeryGarba, Mohammad L, MD   3 Units at 07/09/17 2143  . insulin aspart (novoLOG) injection 0-9 Units  0-9 Units Subcutaneous TID WC Rometta EmeryGarba, Mohammad L, MD   9 Units at 07/10/17 1232  . insulin aspart (novoLOG) injection 5 Units  5 Units Subcutaneous TID WC Jerald Kiefhiu, Stephen K, MD   5 Units at 07/10/17 1231  . insulin glargine (LANTUS) injection 20 Units  20 Units Subcutaneous Q2200 Jerald Kiefhiu, Stephen K, MD   20 Units at 07/09/17 2143  . linagliptin (TRADJENTA) tablet 5 mg  5 mg Oral Daily Earlie LouGarba, Mohammad L, MD   5 mg at 07/10/17 1011  . memantine (NAMENDA XR) 24 hr capsule 28 mg  28 mg Oral Daily Earlie LouGarba, Mohammad L, MD   28 mg at 07/10/17 1012  . metoprolol succinate (TOPROL-XL) 24 hr tablet 25 mg  25 mg Oral Daily Earlie LouGarba, Mohammad L, MD   25 mg at 07/10/17 1012  . mirtazapine (REMERON) tablet 15 mg  15 mg Oral QHS Rometta EmeryGarba, Mohammad L, MD   15 mg at 07/09/17 2144  . multivitamin with minerals tablet 1 tablet  1 tablet Oral Daily Rometta EmeryGarba, Mohammad L, MD   1 tablet at 07/10/17 1011  . ondansetron (ZOFRAN) injection 4 mg  4 mg Intravenous Q6H PRN Earlie LouGarba, Mohammad L, MD      . pantoprazole (PROTONIX) EC tablet 40 mg  40 mg Oral Daily Rometta EmeryGarba, Mohammad L, MD   40 mg at 07/10/17 1012     Discharge Medications: Please see discharge summary for a list of discharge medications.  Relevant Imaging Results:  Relevant Lab Results:   Additional Information SSN:[id number]  Antionette PolesKimberly L Ciana Simmon, LCSW

## 2017-07-10 NOTE — Progress Notes (Signed)
  Speech Language Pathology Treatment: Dysphagia  Patient Details Name: [name] MRN: [id number] DOB: 26-Jul-1940 Today's Date: 07/10/2017 Time: 7510-2585 SLP Time Calculation (min) (ACUTE ONLY): 25 min  Assessment / Plan / Recommendation Clinical Impression  Today pt is full alert and able to assist to self feed.  She tolerated graham cracker, icecream, Ensure and diet gingerale well without oral holding nor indication of airway compromise.  In addition, pt able to hold her own cup and cracker and bring to mouth when SlP placed in her hand.  Recommend advance diet to dys3/thin with precautions - encouraging pt to self feed.  Educated pt to precautions/plan and she is agreeable.  Will follow up x1 to assure tolerance/ suspect resolution of dysphagia due to improved medical status.     HPI HPI: 77 y.o. female with medical history significant of advanced dementia, diabetes, hypertension who is a resident of skilled nursing facility brought in secondary to fever up to 102, worsening mental status as well as white count of 17,000.  Patient was seen in the ER and has met sepsis criteria with evidence of UTI.  She is a full code.  She appears chronically ill looking also.  Family at bedside and indicated that patient's baseline is that of dementia with some mild confusion.      SLP Plan  Continue with current plan of care       Recommendations  Diet recommendations: Dysphagia 3 (mechanical soft);Thin liquid Liquids provided via: Straw Medication Administration: Whole meds with puree Supervision: Staff to assist with self feeding Compensations: Slow rate;Small sips/bites(check for oral residuals) Postural Changes and/or Swallow Maneuvers: Seated upright 90 degrees;Upright 30-60 min after meal                Oral Care Recommendations: Oral care QID Follow up Recommendations: Other (comment);None SLP Visit Diagnosis: Dysphagia, oral phase (R13.11) Plan: Continue with current plan of  care       [place], [name] Ann 07/10/2017, 2:40 PM  [name], [place] [name] SLP [phone]

## 2017-07-10 NOTE — Progress Notes (Signed)
PATR here to transport [facility]. Called and informed daughter Elonda HuskyCassandra. Will transfer care at this time.

## 2017-07-10 NOTE — Progress Notes (Signed)
Patient returning to [facility]. Facility aware of patient's discharge and confirmed patient's ability to return. PTAR contacted, patient's daughter contacted (no answer, CSW left voicemail). Patient's RN can call report to [phone] Room 222B, packet complete. CSW signing off, no other needs identified at this time.  Celso SickleKimberly Dezmen Alcock, [place] Clinical Social Worker [facility] Cell#: [phone]

## 2017-07-10 NOTE — Discharge Summary (Signed)
Physician Discharge Summary  [name] MRN:[id number] DOB: 02/21/1940 DOA: 07/07/2017  PCP: White, Cynthia, MD  Admit date: 07/07/2017 Discharge date: 07/10/2017  Admitted From: SNF Disposition:  SNF  Recommendations for Outpatient Follow-up:  1. Follow up with PCP in 1-2 weeks 2. Please obtain BMP/CBC in one week  Discharge Condition:Improved CODE STATUS:Full Diet recommendation: Diabetic   Brief/Interim Summary: 77 y.o.femalewith medical history significant ofadvanced dementia, diabetes, hypertension who is a resident of skilled nursing facility brought in secondary to fever up to 102, worsening mental status as well as white count of 17,000. Patient was seen in the ER and has met sepsis criteria with evidence of UTI. She is a full code. She appears chronically ill looking also. Family at bedside and indicated that patient's baseline is that of dementia with some mild confusion.  ED Course:Temperature is 101.6, blood pressure 100/59, pulse 113, oxygen sats 96% on room air,white count is 17,000, sodium 158, potassium 4.1, chloride 117, BUN 38, creatinine 1.19, glucose 290   #1Sepsis secondary to UTI present on admission: -Most likely due to acute pyelonephritis as per below -Patient was initially continued on empiricIV vancomycin and cefepime -blood cx neg. Urine cx with multiple species present -abx has been narrowed to cefepime, plan to treat for 2 more days of abx  #2 UTI:  -UA reviewed, consistent with UTI -Urine cx per above -Continue above abx as tolerated -Clinically improved  #3 hypernatremia: -Presenting sodium is 158. -Patient clinically deyhydrated -sodium now improving with D5 1/2 NS -Mentation improving -Sodium has normalized with IVF hydration  #4 Toxic metablolic encephalopathy on advanced dementia: -Patient's baseline is confusion but still able to function.  -Patient is continued on Namenda and Aricept. -Currently with decreased level  of mentation, likely related to above UTI  #5 acute kidney injury: -Most likely prerenal from dehydration.  -Continue IVF per above -Resolved  #6 diabetes: -Poorly controlled at presentation -Will continue on 20 units lantus with 5 units meal coverage, continue to titrate as tolerated -Continue SSI coverage  #7 GERD: -Will continue with PPIs as tolerated  #8 altered mental status, toxic metabolic encephalopathy: -Likely secondary to UTI and worsening dementia as per above -Mental status is improving. Patient much more conversant  #9 Dehydration -Presented cliniclaly dehydrated -Much improving with IVF hydration -Continue diet as tolerated  Discharge Diagnoses:  Principal Problem:   Sepsis (HCC) Active Problems:   Insulin dependent type 2 diabetes mellitus, uncontrolled (HCC)   Dementia, Alzheimer's, with behavior disturbance   AKI (acute kidney injury) (HCC)   Dehydration   GERD without esophagitis   Altered mental state   UTI (urinary tract infection)   Hypernatremia   ARF (acute renal failure) (HCC)   Discharge Instructions  Allergies as of 07/10/2017      Reactions   Losartan Swelling   angioedema      Medication List    TAKE these medications   alendronate 70 MG tablet Commonly known as:  FOSAMAX Take 70 mg by mouth every Tuesday. Take with a full glass of water on an empty stomach.   amLODipine 5 MG tablet Commonly known as:  NORVASC Take 5 mg by mouth daily.   calcium-vitamin D 500-200 MG-UNIT tablet Commonly known as:  OSCAL WITH D Take 1 tablet by mouth daily.   cefdinir 300 MG capsule Commonly known as:  OMNICEF Take 1 capsule (300 mg total) by mouth every 12 (twelve) hours for 2 days.   donepezil 5 MG tablet Commonly known as:  ARICEPT   Take 1 tablet (5 mg total) by mouth at bedtime.   hydrALAZINE 10 MG tablet Commonly known as:  APRESOLINE Take 10 mg by mouth 3 (three) times daily.   LANTUS SOLOSTAR 100 UNIT/ML Solostar  Pen Generic drug:  Insulin Glargine Inject 20 Units into the skin daily at 10 pm.   linagliptin 5 MG Tabs tablet Commonly known as:  TRADJENTA Take 5 mg by mouth daily.   metFORMIN 500 MG 24 hr tablet Commonly known as:  GLUCOPHAGE-XR Take 500 mg by mouth every evening.   metoprolol succinate 25 MG 24 hr tablet Commonly known as:  TOPROL-XL Take 25 mg by mouth daily.   mirtazapine 15 MG tablet Commonly known as:  REMERON Take 15 mg by mouth at bedtime.   multivitamin tablet Take 1 tablet by mouth daily.   NAMENDA XR 28 MG Cp24 24 hr capsule Generic drug:  memantine Take 28 mg by mouth daily.   NOVOLOG FLEXPEN 100 UNIT/ML FlexPen Generic drug:  insulin aspart Inject 6 Units into the skin 3 (three) times daily with meals.   NUTRITIONAL SUPPLEMENT PO Low Concentrated Sweets diet - Regular texture, NAS   omeprazole 40 MG capsule Commonly known as:  PRILOSEC Take 1 capsule (40 mg total) by mouth daily.       Contact information for follow-up providers    Harlan Stains, MD. Schedule an appointment as soon as possible for a visit in 1 week(s).   Specialty:  Family Medicine Contact information: [address] [place] [address] [phone]            Contact information for after-discharge care    Destination    [facility] .   Service:  Skilled Nursing Contact information: [address] [address] [phone]                 Allergies  Allergen Reactions  . Losartan Swelling    angioedema   Procedures/Studies: Dg Chest 2 View  Result Date: 07/07/2017 CLINICAL DATA:  Abnormal labs and fever. Elevated white cell count. Weakness and lethargy for 2 days. History of dementia. EXAM: CHEST - 2 VIEW COMPARISON:  04/24/2017 FINDINGS: Shallow inspiration with linear atelectasis or fibrosis in the lung bases. This is similar to previous study. No airspace disease or consolidation in the  lungs. No blunting of costophrenic angles. No pneumothorax. Mediastinal contours appear intact. Heart size and pulmonary vascularity are normal. IMPRESSION: Shallow inspiration with linear atelectasis or fibrosis in the lung bases. No focal consolidation. Electronically Signed   By: Lucienne Capers M.D.   On: 07/07/2017 21:20     Subjective: Without complaints  Discharge Exam: Vitals:   07/09/17 2050 07/10/17 0549  BP: (!) 142/79 (!) 172/80  Pulse: (!) 103 81  Resp: 18 18  Temp: (!) 100.8 F (38.2 C) 98 F (36.7 C)  SpO2: 100% 100%   Vitals:   07/09/17 1351 07/09/17 1645 07/09/17 2050 07/10/17 0549  BP: 126/62 (!) 141/64 (!) 142/79 (!) 172/80  Pulse: 87 93 (!) 103 81  Resp: _0 Temp: 98.2 F (36.8 C)  (!) 100.8 F (38.2 C) 98 F (36.7 C)  TempSrc: Oral  Oral Oral  SpO2: 99%  100% 100%  Weight:      Height:        General: Pt is alert, awake, not in acute distress Cardiovascular: RRR, S1/S2 +, no rubs, no gallops Respiratory: CTA bilaterally, no wheezing, no  rhonchi Abdominal: Soft, NT, ND, bowel sounds + Extremities: no edema, no cyanosis   The results of significant diagnostics from this hospitalization (including imaging, microbiology, ancillary and laboratory) are listed below for reference.     Microbiology: Recent Results (from the past 240 hour(s))  Culture, blood (Routine x 2)     Status: None (Preliminary result)   Collection Time: 07/07/17  8:24 PM  Result Value Ref Range Status   Specimen Description   Final    BLOOD RIGHT ANTECUBITAL Performed at [facility] [address], [place], [place] [address]    Special Requests   Final    BOTTLES DRAWN AEROBIC AND ANAEROBIC Blood Culture results may not be optimal due to an excessive volume of blood received in culture bottles Performed at [facility] [address] [place], [place] [address]    Culture   Final    NO GROWTH 3 DAYS Performed at [facility], [place] [address], [place], [place] [address]    Report Status PENDING  Incomplete  Culture, blood (Routine x 2)     Status: None (Preliminary result)   Collection Time: 07/07/17  8:26 PM  Result Value Ref Range Status   Specimen Description   Final    BLOOD LEFT ANTECUBITAL Performed at [facility] [address] [place], [place] [address]    Special Requests   Final    BOTTLES DRAWN AEROBIC AND ANAEROBIC Blood Culture results may not be optimal due to an excessive volume of blood received in culture bottles Performed at [facility] [address], [place], [place] [address]    Culture   Final    NO GROWTH 3 DAYS Performed at [facility], [place] [address], [place], [place] [address]    Report Status PENDING  Incomplete  Urine culture     Status: Abnormal   Collection Time: 07/07/17  8:26 PM  Result Value Ref Range Status   Specimen Description   Final    URINE, CATHETERIZED Performed at [facility] [address] [place], [place] [address]    Special Requests   Final    Normal Performed at [facility], [place] [address] [place], [place] [address]    Culture MULTIPLE SPECIES PRESENT, SUGGEST RECOLLECTION (A)  Final   Report Status 07/09/2017 FINAL  Final  MRSA PCR Screening     Status: None   Collection Time: 07/08/17  6:25 PM  Result Value Ref Range Status   MRSA by PCR NEGATIVE NEGATIVE Final    Comment:        The GeneXpert MRSA Assay (FDA approved for NASAL specimens only), is one component of a comprehensive MRSA colonization surveillance program. It is not intended to diagnose MRSA infection nor to guide or monitor treatment for MRSA infections. Performed at [facility], [place] [address] [place], [place] [address]      Labs: BNP (last 3 results) No results for input(s): BNP in the last 8760 hours. Basic Metabolic Panel: Recent Labs  Lab  07/08/17 0107 07/08/17 0353 07/08/17 0825 07/08/17 1510 07/08/17 1945 07/09/17 0414 07/09/17 1343 07/10/17 0502  NA 151* 155* 157* 157* 156* 151* 150* 144  K 4.9 3.6 3.7  --   --  3.1*  --  3.4*  CL 115* 123* 124*  --   --  121*  --  115*  CO2 _0 --   --  23  --  25  GLUCOSE 392* 280* 178*  --   --  199*  --  202*  BUN 37* 34* 32*  --   --  20  --  15  CREATININE 1.38* 1.19* 1.26*  --   --  0.86  --  0.78  CALCIUM 9.1 8.1* 8.3*  --   --  8.2*  --  8.1*   Liver Function Tests: Recent Labs  Lab 07/07/17 2024 07/08/17 0107 07/08/17 0353  AST 35 38 30  ALT _0 ALKPHOS 104 84 73  BILITOT 1.3* 1.5* 1.0  PROT 8.1 6.6 5.8*  ALBUMIN 3.5 2.9* 2.5*   No results for input(s): LIPASE, AMYLASE in the last 168 hours. No results for input(s): AMMONIA in the last 168 hours. CBC: Recent Labs  Lab 07/07/17 2024 07/08/17 0107 07/08/17 0353 07/09/17 0414  WBC 16.8* 14.6* 12.6* 9.9  NEUTROABS 10.5* 7.4 6.9  --   HGB 14.2 12.7 11.7* 11.4*  HCT 44.9 40.4 37.4 36.6  MCV 100.7* 101.3* 101.1* 100.5*  PLT 258 194 174 156   Cardiac Enzymes: No results for input(s): CKTOTAL, CKMB, CKMBINDEX, TROPONINI in the last 168 hours. BNP: Invalid input(s): POCBNP CBG: Recent Labs  Lab 07/09/17 1149 07/09/17 1734 07/09/17 2047 07/10/17 0733 07/10/17 1156  GLUCAP 241* 320* 259* 218* 352*   D-Dimer No results for input(s): DDIMER in the last 72 hours. Hgb A1c No results for input(s): HGBA1C in the last 72 hours. Lipid Profile No results for input(s): CHOL, HDL, LDLCALC, TRIG, CHOLHDL, LDLDIRECT in the last 72 hours. Thyroid function studies No results for input(s): TSH, T4TOTAL, T3FREE, THYROIDAB in the last 72 hours.  Invalid input(s): FREET3 Anemia work up No results for input(s): VITAMINB12, FOLATE, FERRITIN, TIBC, IRON, RETICCTPCT in the last 72 hours. Urinalysis    Component Value Date/Time   COLORURINE AMBER (A) 07/07/2017 2026   APPEARANCEUR CLOUDY (A) 07/07/2017  2026   LABSPEC 1.021 07/07/2017 2026   PHURINE 5.0 07/07/2017 2026   GLUCOSEU >=500 (A) 07/07/2017 2026   HGBUR SMALL (A) 07/07/2017 2026   HGBUR negative 01/28/2009 1523   BILIRUBINUR NEGATIVE 07/07/2017 2026   BILIRUBINUR NEG 04/04/2011 0903   KETONESUR NEGATIVE 07/07/2017 2026   PROTEINUR NEGATIVE 07/07/2017 2026   UROBILINOGEN 0.2 04/04/2011 0903   UROBILINOGEN 0.2 01/28/2009 1523   NITRITE NEGATIVE 07/07/2017 2026   LEUKOCYTESUR LARGE (A) 07/07/2017 2026   Sepsis Labs Invalid input(s): PROCALCITONIN,  WBC,  LACTICIDVEN Microbiology Recent Results (from the past 240 hour(s))  Culture, blood (Routine x 2)     Status: None (Preliminary result)   Collection Time: 07/07/17  8:24 PM  Result Value Ref Range Status   Specimen Description   Final    BLOOD RIGHT ANTECUBITAL Performed at [facility], [place] [address], [place], [place] [address]    Special Requests   Final    BOTTLES DRAWN AEROBIC AND ANAEROBIC Blood Culture results may not be optimal due to an excessive volume of blood received in culture bottles Performed at [facility] [address] [place], [place] [address]    Culture   Final    NO GROWTH 3 DAYS Performed at [facility], [place] [address] [place], [place] [address]    Report Status PENDING  Incomplete  Culture, blood (Routine x 2)     Status: None (Preliminary result)   Collection Time: 07/07/17  8:26 PM  Result Value Ref Range Status   Specimen  Description   Final    BLOOD LEFT ANTECUBITAL Performed at [facility] [address] [place], [place] [address]    Special Requests   Final    BOTTLES DRAWN AEROBIC AND ANAEROBIC Blood Culture results may not be optimal due to an excessive volume of blood received in culture bottles Performed at [facility] [address] [place], [place] [address]    Culture   Final    NO GROWTH 3 DAYS Performed at [facility],  [place] [address] [place], [place] [address]    Report Status PENDING  Incomplete  Urine culture     Status: Abnormal   Collection Time: 07/07/17  8:26 PM  Result Value Ref Range Status   Specimen Description   Final    URINE, CATHETERIZED Performed at [facility] [address], [place], [place] [address]    Special Requests   Final    Normal Performed at [facility], [place] [address] [place], [place] [address]    Culture MULTIPLE SPECIES PRESENT, SUGGEST RECOLLECTION (A)  Final   Report Status 07/09/2017 FINAL  Final  MRSA PCR Screening     Status: None   Collection Time: 07/08/17  6:25 PM  Result Value Ref Range Status   MRSA by PCR NEGATIVE NEGATIVE Final    Comment:        The GeneXpert MRSA Assay (FDA approved for NASAL specimens only), is one component of a comprehensive MRSA colonization surveillance program. It is not intended to diagnose MRSA infection nor to guide or monitor treatment for MRSA infections. Performed at [facility], [place] [address] [place], [place] [address]    Time spent: 43mn  SIGNED:   SMarylu Lund MD  [facility] 07/10/2017, 3:05 PM  If 7PM-7AM, please contact night-coverage [email or web address] Password [email or web address]

## 2017-07-10 NOTE — Clinical Social Work Note (Signed)
Clinical Social Work Assessment  Patient Details  Name: [name] MRN: [id number] Date of Birth: 1940/06/01  Date of referral:  07/10/17               Reason for consult:  Discharge Planning, Facility Placement                Permission sought to share information with:    Permission granted to share information::     Name::        Agency::     Relationship::     Contact Information:     Housing/Transportation Living arrangements for the past 2 months:  Skilled Nursing Facility([facility]) Source of Information:  Adult Children(Son - Charlestine MassedJames Holsclaw [phone])) Patient Interpreter Needed:  None Criminal Activity/Legal Involvement Pertinent to Current Situation/Hospitalization:  No - Comment as needed Significant Relationships:  Adult Children Lives with:  Facility Resident Do you feel safe going back to the place where you live?    Need for family participation in patient care:  Yes (Comment)  Care giving concerns:  Patient admitted [facility]. Patient admitted with sepsis. PT consulted, evaluation pending.   Social Worker assessment / plan:  CSW spoke with patient's son Charlestine Massed(James Beightol [phone]) regarding patient's discharge planning, patient only oriented to self and unable to participate in assessment. CSW contacted patient's daughter Angus Palms([name] Gripp [phone]), no answer. CSW requested return phone call. Patient's son reported that the plan is for patient to return to [facility], noting patient has been there for the past 2 months. Patient's son reported that his sister Tad Moore([name]) is in charge and is probably at work. Patient's son reported that patient will need PTAR to return to the facility.  CSW contacted [facility] and spoke with admissions staff Tammy. Staff confirmed patient's ability to return.  CSW will complete patient's FL2 and send to SNF.  CSW will continue to follow and assist with discharge  planning.  Employment status:  Retired Database administratornsurance information:  Managed Medicare PT Recommendations:  Not assessed at this time Information / Referral to community resources:  Skilled Nursing Facility(Patient was admitted from [facility])  Patient/Family's Response to care:  Patient's son appreciative of CSW assistance with discharge planning.   Patient/Family's Understanding of and Emotional Response to Diagnosis, Current Treatment, and Prognosis:  Patient's son involved in patient's care and verbalized plan for patient to return to SNF at discharge. Patient only oriented to self and unable to participate in assessment.   Emotional Assessment Appearance:    Attitude/Demeanor/Rapport:  Unable to Assess Affect (typically observed):  Unable to Assess Orientation:  Oriented to Self Alcohol / Substance use:  Not Applicable Psych involvement (Current and /or in the community):  No (Comment)  Discharge Needs  Concerns to be addressed:  Care Coordination Readmission within the last 30 days:  No Current discharge risk:  None Barriers to Discharge:  No Barriers Identified   Antionette PolesKimberly L Ziya Coonrod, LCSW 07/10/2017, 11:36 AM

## 2017-07-10 NOTE — Progress Notes (Signed)
CSW received return call from patient's daughter Angus Palms([name] Endres [phone]). Patient's daughter confirmed plan for patient to discharge back to [facility]. Patient's daughter reported that patient is a Shalane Florendo term care resident at [facility] and will need PTAR at discharge. CSW will continue to follow and assist with discharge planning.  Celso SickleKimberly Rainbow Salman, [place] Clinical Social Worker [facility] Cell#: [phone]

## 2017-07-10 NOTE — Evaluation (Signed)
Physical Therapy Evaluation Patient Details Name: Michaela BridgeMary H [name] MRN: [id number] DOB: [birth year] Today's Date: 07/10/2017   History of Present Illness  77 y.o. female with medical history significant of advanced dementia, diabetes, hypertension who is a resident of skilled nursing facility brought in secondary to fever up to 102, worsening mental status-->sepsis d/t UTI   Clinical Impression  Pt admitted with above diagnosis. Pt currently with functional limitations due to the deficits listed below (see PT Problem List). Will follow ina cute setting, difficult to determine pt baseline as she is  A poor historian and no family present; recommend return to SNF, may benefit from PT at SNF depending on her baseline Pt will benefit from skilled PT to increase their independence and safety with mobility to allow discharge to the venue listed below.       Follow Up Recommendations SNF    Equipment Recommendations  None recommended by PT    Recommendations for Other Services       Precautions / Restrictions Precautions Precautions: Fall      Mobility  Bed Mobility Overal bed mobility: Needs Assistance Bed Mobility: Sit to Supine;Supine to Sit     Supine to sit: Mod assist;Max assist Sit to supine: Mod assist;Max assist   General bed mobility comments: assist with LEs and trunk in both directions  Transfers                 General transfer comment: not attempted with +1 assist  Ambulation/Gait                Stairs            Wheelchair Mobility    Modified Rankin (Stroke Patients Only)       Balance Overall balance assessment: Needs assistance Sitting-balance support: Feet unsupported;Feet supported;No upper extremity supported Sitting balance-Leahy Scale: Fair Sitting balance - Comments: able to sit EOB x8 min, posteriorly lean initially however pt able to correct with incr time and cues Postural control: Posterior lean     Standing balance  comment: NT                             Pertinent Vitals/Pain Pain Assessment: No/denies pain    Home Living Family/patient expects to be discharged to:: Skilled nursing facility                      Prior Function Level of Independence: Needs assistance   Gait / Transfers Assistance Needed: [facility], pt is poor historian--unable to ascertain pt functional baseline           Hand Dominance        Extremity/Trunk Assessment   Upper Extremity Assessment Upper Extremity Assessment: RUE deficits/detail;LUE deficits/detail RUE Deficits / Details: pt is resistant to imposed movement, grip poor LUE Deficits / Details: pt resistant although moves LE sponstaneously through >50% ROM; grip fair    Lower Extremity Assessment Lower Extremity Assessment: RLE deficits/detail;LLE deficits/detail RLE Deficits / Details: AAROM grossly WFL; stength >2+/5(difficult to test d/t cognition) LLE Deficits / Details: as above       Communication   Communication: No difficulties  Cognition Arousal/Alertness: Awake/alert Behavior During Therapy: WFL for tasks assessed/performed Overall Cognitive Status: No family/caregiver present to determine baseline cognitive functioning  General Comments      Exercises     Assessment/Plan    PT Assessment Patient needs continued PT services  PT Problem List Decreased strength;Decreased activity tolerance;Decreased balance       PT Treatment Interventions Therapeutic activities;Therapeutic exercise;Functional mobility training    PT Goals (Current goals can be found in the Care Plan section)  Acute Rehab PT Goals Patient Stated Goal: to get some rest PT Goal Formulation: With patient Time For Goal Achievement: 07/24/17 Potential to Achieve Goals: Fair    Frequency Min 2X/week   Barriers to discharge        Co-evaluation                AM-PAC PT "6 Clicks" Daily Activity  Outcome Measure Difficulty turning over in bed (including adjusting bedclothes, sheets and blankets)?: Unable Difficulty moving from lying on back to sitting on the side of the bed? : Unable Difficulty sitting down on and standing up from a chair with arms (e.g., wheelchair, bedside commode, etc,.)?: Unable Help needed moving to and from a bed to chair (including a wheelchair)?: A Lot Help needed walking in hospital room?: Total Help needed climbing 3-5 steps with a railing? : Total 6 Click Score: 7    End of Session Equipment Utilized During Treatment: Gait belt Activity Tolerance: Patient tolerated treatment well Patient left: in bed;with call bell/phone within reach;with bed alarm set   PT Visit Diagnosis: Muscle weakness (generalized) (M62.81)    Time: 1610-9604 PT Time Calculation (min) (ACUTE ONLY): 19 min   Charges:   PT Evaluation $PT Eval Low Complexity: 1 Low     PT G CodesDrucilla Chalet, PT Pager: [phone] 07/10/2017   [facility] 07/10/2017, 2:17 PM

## 2017-07-10 NOTE — Progress Notes (Signed)
Patient blood sugar has been evaluated this shift and discussed with Dr. Rhona Leavenshiu. Instructed to stop giving patient ensure since the need for them has been reduced thru IV medications and increased PO intake. Discontinued orders. Will continue to monitor.

## 2017-07-10 NOTE — Care Management Important Message (Signed)
Important Message  Patient Details  Name: [name] MRN: [id number] Date of Birth: November 18, 1940   Medicare Important Message Given:  Yes    Caren MacadamFuller, Novali Vollman 07/10/2017, 3:10 PMImportant Message  Patient Details  Name: Michaela BridgeMary H [name] MRN: [id number] Date of Birth: November 18, 1940   Medicare Important Message Given:  Yes    Caren MacadamFuller, Keisi Eckford 07/10/2017, 3:10 PM

## 2017-07-11 ENCOUNTER — Non-Acute Institutional Stay (SKILLED_NURSING_FACILITY): Payer: Medicare Other | Admitting: Adult Health

## 2017-07-11 ENCOUNTER — Encounter: Payer: Self-pay | Admitting: Adult Health

## 2017-07-11 DIAGNOSIS — E1165 Type 2 diabetes mellitus with hyperglycemia: Secondary | ICD-10-CM | POA: Diagnosis not present

## 2017-07-11 DIAGNOSIS — K219 Gastro-esophageal reflux disease without esophagitis: Secondary | ICD-10-CM | POA: Diagnosis not present

## 2017-07-11 DIAGNOSIS — A419 Sepsis, unspecified organism: Secondary | ICD-10-CM | POA: Diagnosis not present

## 2017-07-11 DIAGNOSIS — Z794 Long term (current) use of insulin: Secondary | ICD-10-CM

## 2017-07-11 DIAGNOSIS — F0281 Dementia in other diseases classified elsewhere with behavioral disturbance: Secondary | ICD-10-CM | POA: Diagnosis not present

## 2017-07-11 DIAGNOSIS — I10 Essential (primary) hypertension: Secondary | ICD-10-CM | POA: Diagnosis not present

## 2017-07-11 DIAGNOSIS — F324 Major depressive disorder, single episode, in partial remission: Secondary | ICD-10-CM | POA: Diagnosis not present

## 2017-07-11 DIAGNOSIS — G301 Alzheimer's disease with late onset: Secondary | ICD-10-CM

## 2017-07-11 DIAGNOSIS — IMO0002 Reserved for concepts with insufficient information to code with codable children: Secondary | ICD-10-CM

## 2017-07-11 NOTE — Progress Notes (Signed)
Location:   [facility] Room Number: 222 B Place of Service:  SNF (31)   CODE STATUS:  Full Code  Allergies  Allergen Reactions  . Losartan Swelling    angioedema    Chief Complaint  Patient presents with  . Hospitalization Follow-up    Hospital follow up    HPI:  She is a 77 year old long term resident of this facility who has been hospitalized from 07-07-17 through 07-10-17. She had been hospitalized for sepsis secondary to UTI which grew out mixed bacteria. She was treated with IV abt and will need two more days of omnicef. She is unable to participate in the hpi or ros . There are no reports of changes in appetite; no uncontrolled pain; no fevers. She will continue to be followed for her chronic illnesses including: hypertension; gerd and dementia. There are no nursing concerns at this time.    Past Medical History:  Diagnosis Date  . 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 Anxiety   . Blepharitis   . Dementia   . Depression   . Diabetes mellitus   . Essential hypertension 01/12/2009   Qualifier: Diagnosis of  By: Alvester Morin MD, Viviann Spare    . GERD (gastroesophageal reflux disease)   . Heart murmur, systolic   . Hepatitis   . Hyperlipidemia   . Hypertension   . Osteoporosis   . Urinary frequency     Past Surgical History:  Procedure Laterality Date  . COLONOSCOPY N/A 02/16/2012   Procedure: COLONOSCOPY;  Surgeon: Vertell Novak., MD;  Location: [facility] ENDOSCOPY;  Service: Endoscopy;  Laterality: N/A;  . ESOPHAGOGASTRODUODENOSCOPY N/A 02/16/2012   Procedure: ESOPHAGOGASTRODUODENOSCOPY (EGD);  Surgeon: Vertell Novak., MD;  Location[facility] ENDOSCOPY;  Service: Endoscopy;  Laterality: N/A;  . THYROIDECTOMY     in 2000    Social History   Socioeconomic History  . Marital status: Widowed    Spouse name: Not on file  . Number of children: Not on file  . Years of education: 4  . Highest education level: Not on file  Occupational History  . Not  on file  Social Needs  . Financial resource strain: Not on file  . Food insecurity:    Worry: Not on file    Inability: Not on file  . Transportation needs:    Medical: Not on file    Non-medical: Not on file  Tobacco Use  . Smoking status: Never Smoker  . Smokeless tobacco: Never Used  Substance and Sexual Activity  . Alcohol use: No    Alcohol/week: 0.0 oz  . Drug use: No  . Sexual activity: Never  Lifestyle  . Physical activity:    Days per week: Not on file    Minutes per session: Not on file  . Stress: Not on file  Relationships  . Social connections:    Talks on phone: Not on file    Gets together: Not on file    Attends religious service: Not on file    Active member of club or organization: Not on file    Attends meetings of clubs or organizations: Not on file    Relationship status: Not on file  . Intimate partner violence:    Fear of current or ex partner: Not on file    Emotionally abused: Not on file    Physically abused: Not on file    Forced sexual activity: Not on file  Other Topics Concern  . Not on  file  Social History Narrative   Lives with daughter and grand-daughter to help informally supervise pt at home.   Pt also in adult daycare during the week.     Family History  Problem Relation Age of Onset  . Cancer Sister        brain  . Diabetes Son   . Hypertension Son   . Diabetes Daughter       VITAL SIGNS BP (!) 141/82   Pulse (!) 102   Temp 97.9 F (36.6 C)   Resp 16   Ht 5\' 1"  (1.549 m)   Wt 129 lb 3.2 oz (58.6 kg)   SpO2 98%   BMI 24.41 kg/m   Outpatient Encounter Medications as of 07/11/2017  Medication Sig  . alendronate (FOSAMAX) 70 MG tablet Take 70 mg by mouth every Tuesday. Take with a full glass of water on an empty stomach.   Marland Kitchen. amLODipine (NORVASC) 5 MG tablet Take 5 mg by mouth daily.  . calcium-vitamin D (OSCAL WITH D) 500-200 MG-UNIT per tablet Take 1 tablet by mouth daily.  . cefdinir (OMNICEF) 300 MG capsule Take 1  capsule (300 mg total) by mouth every 12 (twelve) hours for 2 days.  Marland Kitchen. donepezil (ARICEPT) 5 MG tablet Take 1 tablet (5 mg total) by mouth at bedtime.  . hydrALAZINE (APRESOLINE) 10 MG tablet Take 10 mg by mouth 3 (three) times daily.  . insulin aspart (NOVOLOG FLEXPEN) 100 UNIT/ML FlexPen Inject 6 Units into the skin 3 (three) times daily with meals. <60 or >400 notify MD  . Insulin Glargine (LANTUS SOLOSTAR) 100 UNIT/ML Solostar Pen Inject 20 Units into the skin daily at 10 pm.   . linagliptin (TRADJENTA) 5 MG TABS tablet Take 5 mg by mouth daily.  . memantine (NAMENDA XR) 28 MG CP24 24 hr capsule Take 28 mg by mouth daily.  . metFORMIN (GLUCOPHAGE-XR) 500 MG 24 hr tablet Take 500 mg by mouth every evening.  . metoprolol succinate (TOPROL-XL) 25 MG 24 hr tablet Take 25 mg by mouth daily.  . mirtazapine (REMERON) 15 MG tablet Take 15 mg by mouth at bedtime.  . Multiple Vitamin (MULTIVITAMIN) tablet Take 1 tablet by mouth daily.  . Nutritional Supplements (NUTRITIONAL SUPPLEMENT PO) Low Concentrated Sweets diet - Regular texture, NAS  . omeprazole (PRILOSEC) 40 MG capsule Take 1 capsule (40 mg total) by mouth daily.   No facility-administered encounter medications on file as of 07/11/2017.      SIGNIFICANT DIAGNOSTIC EXAMS  PREVIOUS:  04-24-17: chest x-ray: No active cardiopulmonary disease.   04-24-17: ct of head: Atrophy and small vessel disease, progressive since 2007. No acute intracranial findings.  04-24-17: ct of abdomen and pelvis: 1. Mild diffuse small bowel distention without mechanical bowel obstruction compatible with a mild small bowel enteritis. 2. Coronary arteriosclerosis. 3. Punctate nonobstructing right lower pole renal calculus. No hydroureteronephrosis. 4. Fibroid uterus.  TODAY:   07-07-17: Chest x-ray: Shallow inspiration with linear atelectasis or fibrosis in the lung bases. No focal consolidation.      LABS REVIEWED; PEVIOUS  04-24-17: wbc 9.5; hgb 15.1; hct  45.0; mcv 247; plt 247 glucose 177 bun 30; creat 1.04; k+ 4.7; na++ 145; ca 9.7; liver normal albumin 4.0; urine culture no growth 04-25-17: wbc 7.3; hgb 13.3; hct 40.0; mcv 95.7; plt 194; glucose 105; bun 24; creat 0.97; k+ 4.2; na++ 143; ca 8.6; liver normal albumin 3.2; mag 1.7; phos 2.2; hgb a1c 5.6  05-04-17: wbc 14.2; hgb 14.1; hct 42.7;  mcv 95.2; plt 214; glucose 275; bun 18.5; creat 0.86; k+ 4.4; an++ 140; ca 9.4   TODAY:   06-01-17: urine micro-albumin 1.2 06-29-17: wbc 8.1; hgb 12.2; hct 37; plt 192; vit B 12: 952 07-07-17: wbc 16.8; hgb 14.2 hct 44.9; mcv 100.7; plt 194; glucose 290; bun 38; creat 1.19; k+ 4.1; na++ 158; ca 9.8; total bili 1.3; albumin 3.5 07-08-17: wbc 12.6; hgb 11.7; hct 37.4; mcv 101.1; plt 174 glucose  280; bun 34; creat 1.19; k+ 3.6; na++ 155; ca 8.1; liver normal albumin 2.5 07-10-17: glucose 202; bun 15; creat 0.78; k+ 3.4; na++144    Review of Systems  Unable to perform ROS: Dementia (nonverbal )    Physical Exam  Constitutional: No distress.  Frail   Neck: No thyromegaly present.  Cardiovascular: Normal rate, regular rhythm, normal heart sounds and intact distal pulses.  Pulmonary/Chest: Effort normal and breath sounds normal. No respiratory distress.  Abdominal: Soft. Bowel sounds are normal. She exhibits no distension. There is no tenderness.  Musculoskeletal: She exhibits no edema.  Is able to move all extremities Is out of bed to wheelchair   Lymphadenopathy:    She has no cervical adenopathy.  Neurological:  Is aware   Skin: Skin is warm and dry. She is not diaphoretic.  Psychiatric:  Is awake      ASSESSMENT/ PLAN:  TODAY:   1. Osteoporosis, age related: stable will continue fosamax 70 mg weekly and is on calcium supplement.   2. Essential hypertension: stable b/p 141/82: will continue norvasc 5 mg daily; toprol xl 25 mg daily  and apresoline 10 mg three times daily   3. Insulin dependent type 2 diabetes mellitus uncontrolled: cbgs remain  elevated; medications recently adjusted  hgb a1c 5.6; will continue metformin xr 500 mg daily tradjenta 5 mg daily lantus 20 units nightly and novolog 6 units after meals.    4. Dementia, alzheimer's with behavior disturbance; without change weight is 129 pounds; will continue aricept 5 mg daily and namenda xr 28 mg daily   5.  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 gerd without esophagitis: stable will continue prilosec 40 mg daily   7. Sepsis from presumed UTI: will have her complete her omnicef for total of 2 days post hospitalization    will check cbc; bmp on 07-17-17     MD is aware of resident's narcotic use and is in agreement with current plan of care. We will attempt to wean resident as apropriate   Synthia Innocent NP [facility]  Contact [phone] Monday through Friday 8am- 5pm  After hours call [phone]

## 2017-07-12 LAB — CULTURE, BLOOD (ROUTINE X 2)
Culture: NO GROWTH
Culture: NO GROWTH

## 2017-07-13 ENCOUNTER — Encounter: Payer: Self-pay | Admitting: Internal Medicine

## 2017-07-13 ENCOUNTER — Non-Acute Institutional Stay (SKILLED_NURSING_FACILITY): Payer: Medicare Other | Admitting: Internal Medicine

## 2017-07-13 DIAGNOSIS — G301 Alzheimer's disease with late onset: Secondary | ICD-10-CM

## 2017-07-13 DIAGNOSIS — E1165 Type 2 diabetes mellitus with hyperglycemia: Secondary | ICD-10-CM

## 2017-07-13 DIAGNOSIS — F02818 Dementia in other diseases classified elsewhere, unspecified severity, with other behavioral disturbance: Secondary | ICD-10-CM

## 2017-07-13 DIAGNOSIS — N10 Acute tubulo-interstitial nephritis: Secondary | ICD-10-CM | POA: Diagnosis not present

## 2017-07-13 DIAGNOSIS — I10 Essential (primary) hypertension: Secondary | ICD-10-CM | POA: Diagnosis not present

## 2017-07-13 DIAGNOSIS — Z794 Long term (current) use of insulin: Secondary | ICD-10-CM

## 2017-07-13 DIAGNOSIS — F0281 Dementia in other diseases classified elsewhere with behavioral disturbance: Secondary | ICD-10-CM

## 2017-07-13 DIAGNOSIS — F324 Major depressive disorder, single episode, in partial remission: Secondary | ICD-10-CM

## 2017-07-13 NOTE — Progress Notes (Signed)
Patient ID: [name], female   DOB: 04-22-1940, 77 y.o.   MRN: [id number]  Provider:  DR Elmon KirschnerMONICA S Jamieson Lisa [facility]  [facility] Room Number: 222 B Place of Service:  SNF (31)  PCP: Myrle ShengEstates, [place] Patient Care Team: Myrle Sheng[facility]ates, [place] as PCP - General (Assisted Living Facility)  Extended Emergency Contact Information Primary Emergency Contact: Kathaleen MaserWoodard,[name]          [place] Home Phone: [phone] Relation: Daughter Secondary Emergency Contact: Blain PaisWoodard,James  [place] Home Phone: [phone] Relation: Son  Code Status: Full code Goals of Care: Advanced Directive information Advanced Directives 07/13/2017  Does Patient Have a Medical Advance Directive? No  Would patient like information on creating a medical advance directive? No - Patient declined  Pre-existing out of facility DNR order (yellow form or pink MOST form) -      Chief Complaint  Patient presents with  . Readmit To SNF    Readmission    HPI: Patient is a 77 y.o. female seen today for re-admission to SNF following hospital stay for sepsis 2/2 UTI with acute pyelonephritis, hypernatremia, altered MS, dehydration, AKI, DM, dementia, HTN. She was tx empirically with IV vanco/cefepime -->cefepime alone after BC neg and UC with multiple species. Na tx with D5 1/2 NS. Na 158-->144; Cr peaked 1.38-->0.78; albumin 2.5; Tbili peaked 1.5-->1; WBC peaked 16.8K with abs neutrophils 10.5K-->9.9K with absN 6.9K at d/c. Last A1c 5.6%. She presents to SNF to continue long term care.  Today she reports no concerns. She is a poor historin due to dementia. Hx obtained from chart. No nursing issues. No falls. Appetite ok. Sleeps well.  Osteoporosis- stable on fosamax 70 mg weekly; calcium supplement.   HTN - stable on norvasc 5 mg daily; toprol xl 25 mg daily; apresoline 10 mg three times daily   DM - controlled overall but has fluctuating BS; A1c 5.6%. Takes  metformin xr 500 mg daily; tradjenta 5 mg daily; lantus 20 units nightly; novolog 6 units after meals.   Dementia, alzheimer's with behavior disturbance - stable on aricept 5 mg daily and namenda xr 28 mg daily   MDD -  in partial remission; mood stable on remeron 15 mg nightly. She does benefit from this regimen   GERD - stable on prilosec 40 mg daily    Past Medical History:  Diagnosis Date  . 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 Anxiety   . Blepharitis   . Dementia   . Depression   . Diabetes mellitus   . Essential hypertension 01/12/2009   Qualifier: Diagnosis of  By: Alvester MorinNewton MD, Viviann SpareSteven    . GERD (gastroesophageal reflux disease)   . Heart murmur, systolic   . Hepatitis   . Hyperlipidemia   . Hypertension   . Osteoporosis   . Urinary frequency    Past Surgical History:  Procedure Laterality Date  . COLONOSCOPY N/A 02/16/2012   Procedure: COLONOSCOPY;  Surgeon: Vertell NovakJames L Edwards Jr., MD;  [facility];  Service: Endoscopy;  Laterality: N/A;  . ESOPHAGOGASTRODUODENOSCOPY N/A 02/16/2012   Procedure: ESOPHAGOGASTRODUODENOSCOPY (EGD);  Surgeon: Vertell NovakJames L Edwards Jr., MD;  [facility];  Service: Endoscopy;  Laterality: N/A;  . THYROIDECTOMY     in 2000    reports that she has never smoked. She has never used smokeless tobacco. She reports that she does not drink alcohol or use drugs. Social History   Socioeconomic History  . Marital status: Widowed  Spouse name: Not on file  . Number of children: Not on file  . Years of education: 21  . Highest education level: Not on file  Occupational History  . Not on file  Social Needs  . Financial resource strain: Not on file  . Food insecurity:    Worry: Not on file    Inability: Not on file  . Transportation needs:    Medical: Not on file    Non-medical: Not on file  Tobacco Use  . Smoking status: Never Smoker  . Smokeless tobacco: Never Used  Substance and Sexual Activity  . Alcohol use: No      Alcohol/week: 0.0 oz  . Drug use: No  . Sexual activity: Never  Lifestyle  . Physical activity:    Days per week: Not on file    Minutes per session: Not on file  . Stress: Not on file  Relationships  . Social connections:    Talks on phone: Not on file    Gets together: Not on file    Attends religious service: Not on file    Active member of club or organization: Not on file    Attends meetings of clubs or organizations: Not on file    Relationship status: Not on file  . Intimate partner violence:    Fear of current or ex partner: Not on file    Emotionally abused: Not on file    Physically abused: Not on file    Forced sexual activity: Not on file  Other Topics Concern  . Not on file  Social History Narrative   Lives with daughter and grand-daughter to help informally supervise pt at home.   Pt also in adult daycare during the week.      Functional Status Survey:    Family History  Problem Relation Age of Onset  . Cancer Sister        brain  . Diabetes Son   . Hypertension Son   . Diabetes Daughter     Health Maintenance  Topic Date Due  . FOOT EXAM  05/04/2018 (Originally 07/20/1950)  . OPHTHALMOLOGY EXAM  05/04/2018 (Originally 07/20/1950)  . INFLUENZA VACCINE  08/03/2017  . HEMOGLOBIN A1C  10/25/2017  . URINE MICROALBUMIN  06/02/2018  . TETANUS/TDAP  11/15/2020  . DEXA SCAN  Completed  . PNA vac Low Risk Adult  Discontinued    Allergies  Allergen Reactions  . Losartan Swelling    angioedema    Outpatient Encounter Medications as of 07/13/2017  Medication Sig  . alendronate (FOSAMAX) 70 MG tablet Take 70 mg by mouth every Tuesday. Take with a full glass of water on an empty stomach.   Marland Kitchen amLODipine (NORVASC) 5 MG tablet Take 5 mg by mouth daily.  . calcium-vitamin D (OSCAL WITH D) 500-200 MG-UNIT per tablet Take 1 tablet by mouth daily.  Marland Kitchen donepezil (ARICEPT) 5 MG tablet Take 1 tablet (5 mg total) by mouth at bedtime.  Marland Kitchen GLUCERNA (GLUCERNA) LIQD  Take 237 mLs by mouth. With meals  . hydrALAZINE (APRESOLINE) 10 MG tablet Take 10 mg by mouth 3 (three) times daily.  . insulin aspart (NOVOLOG FLEXPEN) 100 UNIT/ML FlexPen Inject 6 Units into the skin 3 (three) times daily with meals. <60 or >400 notify MD  . Insulin Glargine (LANTUS SOLOSTAR) 100 UNIT/ML Solostar Pen Inject 20 Units into the skin daily at 10 pm.   . linagliptin (TRADJENTA) 5 MG TABS tablet Take 5 mg by mouth daily.  . memantine (  NAMENDA XR) 28 MG CP24 24 hr capsule Take 28 mg by mouth daily.  . metFORMIN (GLUCOPHAGE-XR) 500 MG 24 hr tablet Take 500 mg by mouth every evening.  . metoprolol succinate (TOPROL-XL) 25 MG 24 hr tablet Take 25 mg by mouth daily.  . mirtazapine (REMERON) 15 MG tablet Take 15 mg by mouth at bedtime.  . Multiple Vitamin (MULTIVITAMIN) tablet Take 1 tablet by mouth daily.  . Nutritional Supplements (NUTRITIONAL SUPPLEMENT PO) Low Concentrated Sweets diet - Regular texture, NAS  . Nutritional Supplements (NUTRITIONAL SUPPLEMENT PO) Diet - Pureed texture, regular consistency  . omeprazole (PRILOSEC) 40 MG capsule Take 1 capsule (40 mg total) by mouth daily.   No facility-administered encounter medications on file as of 07/13/2017.     Review of Systems  Unable to perform ROS: Dementia    Vitals:   07/13/17 0934  BP: 130/78  Pulse: 72  Resp: 18  Temp: (!) 97.4 F (36.3 C)  SpO2: 98%  Weight: 129 lb 3.2 oz (58.6 kg)  Height: 5\' 1"  (1.549 m)   Body mass index is 24.41 kg/m. Physical Exam  Constitutional: She appears well-developed and well-nourished.  Asleep and refused to open eyes, frail appearing, sitting in w/c in NAD; she does grimace with sternal rub and moves UE  HENT:  Mouth/Throat: Oropharynx is clear and moist. No oropharyngeal exudate.  MMM; no oral thrush  Eyes: Pupils are equal, round, and reactive to light. No scleral icterus.  Neck: Neck supple. Carotid bruit is not present. No tracheal deviation present. No thyromegaly  present.  Cardiovascular: Normal rate, regular rhythm and intact distal pulses. Exam reveals no gallop and no friction rub.  Murmur (2/6 SEM) heard. Trace LE edema b/l; no calf TTP  Pulmonary/Chest: Effort normal and breath sounds normal. No stridor. No respiratory distress. She has no wheezes. She has no rales.  Abdominal: Soft. Normal appearance and bowel sounds are normal. She exhibits no distension and no mass. There is no hepatomegaly. There is no tenderness. There is no rigidity, no rebound and no guarding. No hernia.  obese  Lymphadenopathy:    She has no cervical adenopathy.  Neurological: She is alert. She has normal reflexes.  Skin: Skin is warm and dry. No rash noted.  Psychiatric: She has a normal mood and affect. Her behavior is normal. She is noncommunicative.    Labs reviewed: Basic Metabolic Panel: Recent Labs    04/25/17 0051  07/08/17 0825  07/09/17 0414 07/09/17 1343 07/10/17 0502  NA 143   < > 157*   < > 151* 150* 144  K 4.2   < > 3.7  --  3.1*  --  3.4*  CL 111   < > 124*  --  121*  --  115*  CO2 22   < > 27  --  23  --  25  GLUCOSE 105*   < > 178*  --  199*  --  202*  BUN 24*   < > 32*  --  20  --  15  CREATININE 0.97   < > 1.26*  --  0.86  --  0.78  CALCIUM 8.6*   < > 8.3*  --  8.2*  --  8.1*  MG 1.7  --   --   --   --   --   --   PHOS 2.2*  --   --   --   --   --   --    < > =  values in this interval not displayed.   Liver Function Tests: Recent Labs    07/07/17 2024 07/08/17 0107 07/08/17 0353  AST 35 38 30  ALT 30 27 22   ALKPHOS 104 84 73  BILITOT 1.3* 1.5* 1.0  PROT 8.1 6.6 5.8*  ALBUMIN 3.5 2.9* 2.5*   Recent Labs    04/24/17 1545  LIPASE 40   No results for input(s): AMMONIA in the last 8760 hours. CBC: Recent Labs    07/07/17 2024 07/08/17 0107 07/08/17 0353 07/09/17 0414  WBC 16.8* 14.6* 12.6* 9.9  NEUTROABS 10.5* 7.4 6.9  --   HGB 14.2 12.7 11.7* 11.4*  HCT 44.9 40.4 37.4 36.6  MCV 100.7* 101.3* 101.1* 100.5*  PLT 258  194 174 156   Cardiac Enzymes: Recent Labs    04/25/17 0051 04/25/17 0617 04/25/17 1325  TROPONINI <0.03 <0.03 <0.03   BNP: Invalid input(s): POCBNP Lab Results  Component Value Date   HGBA1C 5.6 04/25/2017   Lab Results  Component Value Date   TSH 2.247 04/25/2017   Lab Results  Component Value Date   VITAMINB12 952 06/29/2017   Lab Results  Component Value Date   FOLATE >20.0 02/14/2012   Lab Results  Component Value Date   IRON <10 (L) 02/14/2012   TIBC Not calculated due to Iron <10. 02/14/2012   FERRITIN 5 (L) 02/14/2012    Imaging and Procedures obtained prior to SNF admission: Dg Chest 2 View  Result Date: 07/07/2017 CLINICAL DATA:  Abnormal labs and fever. Elevated white cell count. Weakness and lethargy for 2 days. History of dementia. EXAM: CHEST - 2 VIEW COMPARISON:  04/24/2017 FINDINGS: Shallow inspiration with linear atelectasis or fibrosis in the lung bases. This is similar to previous study. No airspace disease or consolidation in the lungs. No blunting of costophrenic angles. No pneumothorax. Mediastinal contours appear intact. Heart size and pulmonary vascularity are normal. IMPRESSION: Shallow inspiration with linear atelectasis or fibrosis in the lung bases. No focal consolidation. Electronically Signed   By: Burman Nieves M.D.   On: 07/07/2017 21:20    Assessment/Plan   ICD-10-CM   1. Late onset Alzheimer's disease with behavioral disturbance G30.1    F02.81   2. Essential hypertension I10   3. Type 2 diabetes mellitus with hyperglycemia, with long-term current use of insulin (HCC) E11.65    Z79.4   4. Acute pyelonephritis N10    resolving  5. Depression, major, single episode, in partial remission (HCC) F32.4     Increase lantus 24 units qhs  Cont other meds as ordered  PT/OT/ST as ordered  Cont nutritional supplements as indicated  GOAL: long term care. Communicated with pt and nursing.    Will follow  Labs/tests ordered: cbc  and bmp for next week    Kiam Bransfield S. Ancil Linsey  [facility] [address] [place], [place] [address] [phone] Cell (Monday-Friday 8 AM - 5 PM) [phone] After 5 PM and follow prompts

## 2017-07-17 LAB — CBC AND DIFFERENTIAL
HCT: 34 — AB (ref 36–46)
Hemoglobin: 11.2 — AB (ref 12.0–16.0)
Neutrophils Absolute: 7
PLATELETS: 328 (ref 150–399)
WBC: 11.2

## 2017-07-17 LAB — BASIC METABOLIC PANEL
BUN: 18 (ref 4–21)
Creatinine: 0.8 (ref 0.5–1.1)
Glucose: 310
POTASSIUM: 3.8 (ref 3.4–5.3)
Sodium: 144 (ref 137–147)

## 2017-07-19 ENCOUNTER — Non-Acute Institutional Stay (SKILLED_NURSING_FACILITY): Payer: Medicare Other | Admitting: Adult Health

## 2017-07-19 ENCOUNTER — Encounter: Payer: Self-pay | Admitting: Adult Health

## 2017-07-19 DIAGNOSIS — IMO0002 Reserved for concepts with insufficient information to code with codable children: Secondary | ICD-10-CM

## 2017-07-19 DIAGNOSIS — M810 Age-related osteoporosis without current pathological fracture: Secondary | ICD-10-CM

## 2017-07-19 DIAGNOSIS — I10 Essential (primary) hypertension: Secondary | ICD-10-CM | POA: Diagnosis not present

## 2017-07-19 DIAGNOSIS — E1165 Type 2 diabetes mellitus with hyperglycemia: Secondary | ICD-10-CM

## 2017-07-19 DIAGNOSIS — Z794 Long term (current) use of insulin: Secondary | ICD-10-CM | POA: Diagnosis not present

## 2017-07-19 NOTE — Progress Notes (Signed)
Location:   [facility] Room Number: 222 B Place of Service:  SNF (31)   CODE STATUS: Full code  Allergies  Allergen Reactions  . Losartan Swelling    angioedema    Chief Complaint  Patient presents with  . Medical Management of Chronic Issues    Hypertension; osteoporosis; diabetes. Weekly follow for the first 30 days post hospitalization     HPI:  She is a 77 year old long term resident of this facility being seen for the management of her chronic illnesses; hypertension; osteoporosis; diabetes. She is unable to participate in the phi or ros. There are no reports of changes in appetite; no hypoglycemia; no changes in behaviors; no uncontrolled pain. There are no nursing concerns at this time.   Past Medical History:  Diagnosis Date  . 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 Anxiety   . Blepharitis   . Dementia   . Depression   . Diabetes mellitus   . Essential hypertension 01/12/2009   Qualifier: Diagnosis of  By: Alvester MorinNewton MD, Viviann SpareSteven    . GERD (gastroesophageal reflux disease)   . Heart murmur, systolic   . Hepatitis   . Hyperlipidemia   . Hypertension   . Osteoporosis   . Urinary frequency     Past Surgical History:  Procedure Laterality Date  . COLONOSCOPY N/A 02/16/2012   Procedure: COLONOSCOPY;  Surgeon: Vertell NovakJames L Edwards Jr., MD;  [facility];  Service: Endoscopy;  Laterality: N/A;  . ESOPHAGOGASTRODUODENOSCOPY N/A 02/16/2012   Procedure: ESOPHAGOGASTRODUODENOSCOPY (EGD);  Surgeon: Vertell NovakJames L Edwards Jr., MD;  [facility];  Service: Endoscopy;  Laterality: N/A;  . THYROIDECTOMY     in 2000    Social History   Socioeconomic History  . Marital status: Widowed    Spouse name: Not on file  . Number of children: Not on file  . Years of education: 7112  . Highest education level: Not on file  Occupational History  . Not on file  Social Needs  . Financial resource strain: Not on file  . Food insecurity:    Worry:  Not on file    Inability: Not on file  . Transportation needs:    Medical: Not on file    Non-medical: Not on file  Tobacco Use  . Smoking status: Never Smoker  . Smokeless tobacco: Never Used  Substance and Sexual Activity  . Alcohol use: No    Alcohol/week: 0.0 oz  . Drug use: No  . Sexual activity: Never  Lifestyle  . Physical activity:    Days per week: Not on file    Minutes per session: Not on file  . Stress: Not on file  Relationships  . Social connections:    Talks on phone: Not on file    Gets together: Not on file    Attends religious service: Not on file    Active member of club or organization: Not on file    Attends meetings of clubs or organizations: Not on file    Relationship status: Not on file  . Intimate partner violence:    Fear of current or ex partner: Not on file    Emotionally abused: Not on file    Physically abused: Not on file    Forced sexual activity: Not on file  Other Topics Concern  . Not on file  Social History Narrative   Lives with daughter and grand-daughter to help informally supervise pt at home.   Pt also in adult  daycare during the week.     Family History  Problem Relation Age of Onset  . Cancer Sister        brain  . Diabetes Son   . Hypertension Son   . Diabetes Daughter       VITAL SIGNS BP 130/78   Pulse 72   Temp (!) 97.4 F (36.3 C)   Resp 18   Ht 5\' 1"  (1.549 m)   Wt 125 lb 3.2 oz (56.8 kg)   SpO2 98%   BMI 23.66 kg/m   Outpatient Encounter Medications as of 07/19/2017  Medication Sig  . alendronate (FOSAMAX) 70 MG tablet Take 70 mg by mouth every Tuesday. Take with a full glass of water on an empty stomach.   Marland Kitchen amLODipine (NORVASC) 5 MG tablet Take 5 mg by mouth daily.  . calcium-vitamin D (OSCAL WITH D) 500-200 MG-UNIT per tablet Take 1 tablet by mouth daily.  Marland Kitchen donepezil (ARICEPT) 5 MG tablet Take 1 tablet (5 mg total) by mouth at bedtime.  Marland Kitchen GLUCERNA (GLUCERNA) LIQD Take 237 mLs by mouth. With meals    . hydrALAZINE (APRESOLINE) 10 MG tablet Take 10 mg by mouth 3 (three) times daily.  . insulin aspart (NOVOLOG FLEXPEN) 100 UNIT/ML FlexPen Inject 6 Units into the skin 3 (three) times daily with meals. <60 or >400 notify MD  . Insulin Glargine (LANTUS SOLOSTAR) 100 UNIT/ML Solostar Pen Inject 24 Units into the skin daily at 10 pm.   . linagliptin (TRADJENTA) 5 MG TABS tablet Take 5 mg by mouth daily.  . memantine (NAMENDA XR) 28 MG CP24 24 hr capsule Take 28 mg by mouth daily.  . metFORMIN (GLUCOPHAGE-XR) 500 MG 24 hr tablet Take 500 mg by mouth every evening.  . metoprolol succinate (TOPROL-XL) 25 MG 24 hr tablet Take 25 mg by mouth daily.  . mirtazapine (REMERON) 15 MG tablet Take 15 mg by mouth at bedtime.  . Multiple Vitamin (MULTIVITAMIN) tablet Take 1 tablet by mouth daily.  . Nutritional Supplements (NUTRITIONAL SUPPLEMENT PO) Low Concentrated Sweets diet - Pureed texture, Regular consistency  . omeprazole (PRILOSEC) 40 MG capsule Take 1 capsule (40 mg total) by mouth daily.  . [DISCONTINUED] Nutritional Supplements (NUTRITIONAL SUPPLEMENT PO) Diet - Pureed texture, regular consistency   No facility-administered encounter medications on file as of 07/19/2017.      SIGNIFICANT DIAGNOSTIC EXAMS  PREVIOUS:  04-24-17: chest x-ray: No active cardiopulmonary disease.   04-24-17: ct of head: Atrophy and small vessel disease, progressive since 2007. No acute intracranial findings.  04-24-17: ct of abdomen and pelvis: 1. Mild diffuse small bowel distention without mechanical bowel obstruction compatible with a mild small bowel enteritis. 2. Coronary arteriosclerosis. 3. Punctate nonobstructing right lower pole renal calculus. No hydroureteronephrosis. 4. Fibroid uterus.  07-07-17: Chest x-ray: Shallow inspiration with linear atelectasis or fibrosis in the lung bases. No focal consolidation.   NO NEW EXAMS    LABS REVIEWED; PEVIOUS  04-24-17: wbc 9.5; hgb 15.1; hct 45.0; mcv 247; plt 247  glucose 177 bun 30; creat 1.04; k+ 4.7; na++ 145; ca 9.7; liver normal albumin 4.0; urine culture no growth 04-25-17: wbc 7.3; hgb 13.3; hct 40.0; mcv 95.7; plt 194; glucose 105; bun 24; creat 0.97; k+ 4.2; na++ 143; ca 8.6; liver normal albumin 3.2; mag 1.7; phos 2.2; hgb a1c 5.6  05-04-17: wbc 14.2; hgb 14.1; hct 42.7; mcv 95.2; plt 214; glucose 275; bun 18.5; creat 0.86; k+ 4.4; an++ 140; ca 9.4  06-01-17: urine micro-albumin  1.2 06-29-17: wbc 8.1; hgb 12.2; hct 37; plt 192; vit B 12: 952 07-07-17: wbc 16.8; hgb 14.2 hct 44.9; mcv 100.7; plt 194; glucose 290; bun 38; creat 1.19; k+ 4.1; na++ 158; ca 9.8; total bili 1.3; albumin 3.5 07-08-17: wbc 12.6; hgb 11.7; hct 37.4; mcv 101.1; plt 174 glucose  280; bun 34; creat 1.19; k+ 3.6; na++ 155; ca 8.1; liver normal albumin 2.5 07-10-17: glucose 202; bun 15; creat 0.78; k+ 3.4; na++144  TODAY:   07-17-17: wbc 11.2; hgb 11.2; hct 34; plt 328; glucose 310; bun 18; creat 0.8; k+ 3.8; na++144  Review of Systems  Unable to perform ROS: Dementia (nonverbal )   Physical Exam  Constitutional: No distress.  Frail   Neck: No thyromegaly present.  Cardiovascular: Normal rate, regular rhythm, normal heart sounds and intact distal pulses.  Pulmonary/Chest: Effort normal and breath sounds normal. No respiratory distress.  Abdominal: Soft. Bowel sounds are normal. She exhibits no distension. There is no tenderness.  Musculoskeletal: She exhibits no edema.  Is able to move all extremities Is out of bed to wheelchair   Lymphadenopathy:    She has no cervical adenopathy.  Neurological:  Is aware  Skin: Skin is warm and dry. She is not diaphoretic.  Psychiatric:  Is awake     ASSESSMENT/ PLAN:  TODAY:   1. Osteoporosis, age related: stable will continue fosamax 70 mg weekly and is on calcium supplement.   2. Essential hypertension: stable b/p 130/78: will continue norvasc 5 mg daily; toprol xl 25 mg daily  and apresoline 10 mg three times daily   3.  Insulin dependent type 2 diabetes mellitus uncontrolled: is stable   hgb a1c 5.6; will continue metformin xr 500 mg daily tradjenta 5 mg daily lantus 20 units nightly and novolog 6 units after meals.   PREVIOUS   4. Dementia, alzheimer's with behavior disturbance; without change weight is 125 pounds; will continue aricept 5 mg daily and namenda xr 28 mg daily   5.  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 gerd without esophagitis: stable will continue prilosec 40 mg daily         MD is aware of resident's narcotic use and is in agreement with current plan of care. We will attempt to wean resident as apropriate   Synthia Innocent NP [facility]  Contact [phone] Monday through Friday 8am- 5pm  After hours call [phone]

## 2017-07-25 ENCOUNTER — Encounter: Payer: Self-pay | Admitting: Adult Health

## 2017-07-25 ENCOUNTER — Non-Acute Institutional Stay (SKILLED_NURSING_FACILITY): Payer: Medicare Other | Admitting: Adult Health

## 2017-07-25 DIAGNOSIS — F324 Major depressive disorder, single episode, in partial remission: Secondary | ICD-10-CM | POA: Diagnosis not present

## 2017-07-25 DIAGNOSIS — K219 Gastro-esophageal reflux disease without esophagitis: Secondary | ICD-10-CM

## 2017-07-25 DIAGNOSIS — G301 Alzheimer's disease with late onset: Secondary | ICD-10-CM | POA: Diagnosis not present

## 2017-07-25 DIAGNOSIS — F0281 Dementia in other diseases classified elsewhere with behavioral disturbance: Secondary | ICD-10-CM

## 2017-07-25 NOTE — Progress Notes (Signed)
Location:   [facility] Room Number: 222 B Place of Service:  SNF (31)   CODE STATUS: Full Code  Allergies  Allergen Reactions  . Losartan Swelling    angioedema    Chief Complaint  Patient presents with  . Medical Management of Chronic Issues    Gerd; dementia; depression. Weekly follow up for the first 30 days post hospitalization     HPI:  She is a 77 year old long term resident of this facility being seen for the management of her chronic illnesses: gerd; dementia; depression. She is unable to participate in the hpi or ros. There are no reports of anxiety; no change in appetite; no pain. There are no nursing concerns at this time.   Past Medical History:  Diagnosis Date  . 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 Anxiety   . Blepharitis   . Dementia   . Depression   . Diabetes mellitus   . Essential hypertension 01/12/2009   Qualifier: Diagnosis of  By: Alvester MorinNewton MD, Viviann SpareSteven    . GERD (gastroesophageal reflux disease)   . Heart murmur, systolic   . Hepatitis   . Hyperlipidemia   . Hypertension   . Osteoporosis   . Urinary frequency     Past Surgical History:  Procedure Laterality Date  . COLONOSCOPY N/A 02/16/2012   Procedure: COLONOSCOPY;  Surgeon: Vertell NovakJames L Edwards Jr., MD;  [facility];  Service: Endoscopy;  Laterality: N/A;  . ESOPHAGOGASTRODUODENOSCOPY N/A 02/16/2012   Procedure: ESOPHAGOGASTRODUODENOSCOPY (EGD);  Surgeon: Vertell NovakJames L Edwards Jr., MD;  [facility];  Service: Endoscopy;  Laterality: N/A;  . THYROIDECTOMY     in 2000    Social History   Socioeconomic History  . Marital status: Widowed    Spouse name: Not on file  . Number of children: Not on file  . Years of education: 4612  . Highest education level: Not on file  Occupational History  . Not on file  Social Needs  . Financial resource strain: Not on file  . Food insecurity:    Worry: Not on file    Inability: Not on file  . Transportation  needs:    Medical: Not on file    Non-medical: Not on file  Tobacco Use  . Smoking status: Never Smoker  . Smokeless tobacco: Never Used  Substance and Sexual Activity  . Alcohol use: No    Alcohol/week: 0.0 oz  . Drug use: No  . Sexual activity: Never  Lifestyle  . Physical activity:    Days per week: Not on file    Minutes per session: Not on file  . Stress: Not on file  Relationships  . Social connections:    Talks on phone: Not on file    Gets together: Not on file    Attends religious service: Not on file    Active member of club or organization: Not on file    Attends meetings of clubs or organizations: Not on file    Relationship status: Not on file  . Intimate partner violence:    Fear of current or ex partner: Not on file    Emotionally abused: Not on file    Physically abused: Not on file    Forced sexual activity: Not on file  Other Topics Concern  . Not on file  Social History Narrative   Lives with daughter and grand-daughter to help informally supervise pt at home.   Pt also in adult daycare during the week.  Family History  Problem Relation Age of Onset  . Cancer Sister        brain  . Diabetes Son   . Hypertension Son   . Diabetes Daughter       VITAL SIGNS BP 130/76   Pulse 72   Temp (!) 97.4 F (36.3 C)   Resp 18   Ht 5\' 1"  (1.549 m)   Wt 125 lb 3.2 oz (56.8 kg)   SpO2 98%   BMI 23.66 kg/m   Outpatient Encounter Medications as of 07/25/2017  Medication Sig  . alendronate (FOSAMAX) 70 MG tablet Take 70 mg by mouth every Tuesday. Take with a full glass of water on an empty stomach.   Marland Kitchen amLODipine (NORVASC) 5 MG tablet Take 5 mg by mouth daily.  . calcium-vitamin D (OSCAL WITH D) 500-200 MG-UNIT per tablet Take 1 tablet by mouth daily.  Marland Kitchen donepezil (ARICEPT) 5 MG tablet Take 1 tablet (5 mg total) by mouth at bedtime.  Marland Kitchen GLUCERNA (GLUCERNA) LIQD Take 237 mLs by mouth. With meals  . hydrALAZINE (APRESOLINE) 10 MG tablet Take 10 mg by  mouth 3 (three) times daily.  . insulin aspart (NOVOLOG FLEXPEN) 100 UNIT/ML FlexPen Inject 8 Units into the skin 3 (three) times daily with meals. <60 or >400 notify MD  . Insulin Glargine (LANTUS SOLOSTAR) 100 UNIT/ML Solostar Pen Inject 24 Units into the skin daily at 10 pm.   . linagliptin (TRADJENTA) 5 MG TABS tablet Take 5 mg by mouth daily.  . memantine (NAMENDA XR) 28 MG CP24 24 hr capsule Take 28 mg by mouth daily.  . metFORMIN (GLUCOPHAGE-XR) 500 MG 24 hr tablet Take 500 mg by mouth every evening.  . metoprolol succinate (TOPROL-XL) 25 MG 24 hr tablet Take 25 mg by mouth daily.  . mirtazapine (REMERON) 15 MG tablet Take 15 mg by mouth at bedtime.  . Multiple Vitamin (MULTIVITAMIN) tablet Take 1 tablet by mouth daily.  . Nutritional Supplements (NUTRITIONAL SUPPLEMENT PO) Low Concentrated Sweets diet - Pureed texture, Regular consistency  . omeprazole (PRILOSEC) 40 MG capsule Take 1 capsule (40 mg total) by mouth daily.   No facility-administered encounter medications on file as of 07/25/2017.      SIGNIFICANT DIAGNOSTIC EXAMS  PREVIOUS:  04-24-17: chest x-ray: No active cardiopulmonary disease.   04-24-17: ct of head: Atrophy and small vessel disease, progressive since 2007. No acute intracranial findings.  04-24-17: ct of abdomen and pelvis: 1. Mild diffuse small bowel distention without mechanical bowel obstruction compatible with a mild small bowel enteritis. 2. Coronary arteriosclerosis. 3. Punctate nonobstructing right lower pole renal calculus. No hydroureteronephrosis. 4. Fibroid uterus.  07-07-17: Chest x-ray: Shallow inspiration with linear atelectasis or fibrosis in the lung bases. No focal consolidation.   NO NEW EXAMS    LABS REVIEWED; PEVIOUS  04-24-17: wbc 9.5; hgb 15.1; hct 45.0; mcv 247; plt 247 glucose 177 bun 30; creat 1.04; k+ 4.7; na++ 145; ca 9.7; liver normal albumin 4.0; urine culture no growth 04-25-17: wbc 7.3; hgb 13.3; hct 40.0; mcv 95.7; plt 194;  glucose 105; bun 24; creat 0.97; k+ 4.2; na++ 143; ca 8.6; liver normal albumin 3.2; mag 1.7; phos 2.2; hgb a1c 5.6  05-04-17: wbc 14.2; hgb 14.1; hct 42.7; mcv 95.2; plt 214; glucose 275; bun 18.5; creat 0.86; k+ 4.4; an++ 140; ca 9.4  06-01-17: urine micro-albumin 1.2 06-29-17: wbc 8.1; hgb 12.2; hct 37; plt 192; vit B 12: 952 07-07-17: wbc 16.8; hgb 14.2 hct 44.9; mcv 100.7;  plt 194; glucose 290; bun 38; creat 1.19; k+ 4.1; na++ 158; ca 9.8; total bili 1.3; albumin 3.5 07-08-17: wbc 12.6; hgb 11.7; hct 37.4; mcv 101.1; plt 174 glucose  280; bun 34; creat 1.19; k+ 3.6; na++ 155; ca 8.1; liver normal albumin 2.5 07-10-17: glucose 202; bun 15; creat 0.78; k+ 3.4; na++144 07-17-17: wbc 11.2; hgb 11.2; hct 34; plt 328; glucose 310; bun 18; creat 0.8; k+ 3.8; na++144  NO NEW LABS.   Review of Systems  Unable to perform ROS: Dementia (nonverbal )   Physical Exam  Constitutional: She appears well-developed and well-nourished. No distress.  Neck: No thyromegaly present.  Cardiovascular: Normal rate, regular rhythm, normal heart sounds and intact distal pulses.  Pulmonary/Chest: Effort normal and breath sounds normal. No respiratory distress.  Abdominal: Soft. Bowel sounds are normal. She exhibits no distension. There is no tenderness.  Musculoskeletal: She exhibits no edema.  Is able to move all extremities Is out of bed to wheelchair    Lymphadenopathy:    She has no cervical adenopathy.  Neurological:  aware  Skin: Skin is warm and dry. She is not diaphoretic.  Psychiatric:  Awake       ASSESSMENT/ PLAN:  TODAY:   1. Dementia, alzheimer's with behavior disturbance; without change weight is 125 pounds; will continue aricept 5 mg daily and namenda xr 28 mg daily   2.  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 gerd without esophagitis: stable will continue prilosec 40 mg daily   PREVIOUS  4. Osteoporosis, age related: stable  will continue fosamax 70 mg weekly and is on calcium supplement.   5. Essential hypertension: stable b/p 130/76: will continue norvasc 5 mg daily; toprol xl 25 mg daily  and apresoline 10 mg three times daily   6. Insulin dependent type 2 diabetes mellitus uncontrolled: is stable   hgb a1c 5.6; will continue metformin xr 500 mg daily tradjenta 5 mg daily lantus 20 units nightly and novolog 6 units after meals.           MD is aware of resident's narcotic use and is in agreement with current plan of care. We will attempt to wean resident as apropriate   Synthia Innocent NP [facility]  Contact [phone] Monday through Friday 8am- 5pm  After hours call [phone]

## 2017-07-31 ENCOUNTER — Encounter: Payer: Self-pay | Admitting: Internal Medicine

## 2017-07-31 ENCOUNTER — Non-Acute Institutional Stay (SKILLED_NURSING_FACILITY): Payer: Medicare Other | Admitting: Internal Medicine

## 2017-07-31 DIAGNOSIS — D72829 Elevated white blood cell count, unspecified: Secondary | ICD-10-CM

## 2017-07-31 DIAGNOSIS — R509 Fever, unspecified: Secondary | ICD-10-CM

## 2017-07-31 DIAGNOSIS — R4182 Altered mental status, unspecified: Secondary | ICD-10-CM

## 2017-07-31 DIAGNOSIS — E870 Hyperosmolality and hypernatremia: Secondary | ICD-10-CM

## 2017-07-31 DIAGNOSIS — F0281 Dementia in other diseases classified elsewhere with behavioral disturbance: Secondary | ICD-10-CM

## 2017-07-31 DIAGNOSIS — E860 Dehydration: Secondary | ICD-10-CM | POA: Diagnosis not present

## 2017-07-31 DIAGNOSIS — G301 Alzheimer's disease with late onset: Secondary | ICD-10-CM

## 2017-07-31 LAB — BASIC METABOLIC PANEL
BUN: 25 — AB (ref 4–21)
Creatinine: 1 (ref 0.5–1.1)
GLUCOSE: 153
POTASSIUM: 3.6 (ref 3.4–5.3)
Sodium: 154 — AB (ref 137–147)

## 2017-07-31 LAB — HEPATIC FUNCTION PANEL
ALT: 23 (ref 7–35)
AST: 32 (ref 13–35)
Alkaline Phosphatase: 136 — AB (ref 25–125)
Bilirubin, Total: 2.3

## 2017-07-31 LAB — CBC AND DIFFERENTIAL
HEMATOCRIT: 36 (ref 36–46)
Hemoglobin: 11.6 — AB (ref 12.0–16.0)
Neutrophils Absolute: 13
Platelets: 337 (ref 150–399)
WBC: 18.5

## 2017-07-31 NOTE — Progress Notes (Signed)
Patient ID: [name], female   DOB: [birth year], 77 y.o.   MRN: [id number]  Location:  [place] Room Number: 809 B Place of Service:  SNF (31) Provider:  [place], [place]  Patient Care Team: Margreta Journey as PCP - General ([place])  Extended Emergency Contact Information Primary Emergency Contact: Lynford Humphrey of [place] Phone: [phone] Relation: Daughter Secondary Emergency Contact: [place] of [place] Phone: [phone] Relation: Son  Code Status:  Full Code Goals of care: Advanced Directive information Advanced Directives 07/31/2017  Does Patient Have a Medical Advance Directive? No  Would patient like information on creating a medical advance directive? No - Patient declined  Pre-existing out of facility DNR order (yellow form or pink MOST form) -     Chief Complaint  Patient presents with  . Acute Visit    Change in status    HPI:  Pt is a 77 y.o. female seen today for an acute visit for fever (Tm 101.80f, tachy @ 141, change in mental status and pocketing food. No N/V. No change in bowel/bladder habits. Appetite poor. She is a poor historian due to dementia and altered mental status. Hx obtained from chart.   Past Medical History:  Diagnosis Date  . 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 Anxiety   . Blepharitis   . Dementia   . Depression   . Diabetes mellitus   . Essential hypertension 01/12/2009   Qualifier: Diagnosis of  By: NErnestina PatchesMD, SRemo Lipps   . GERD (gastroesophageal reflux disease)   . Heart murmur, systolic   . Hepatitis   . Hyperlipidemia   . Hypertension   . Osteoporosis   . Urinary frequency    Past Surgical History:  Procedure Laterality Date  . COLONOSCOPY N/A 02/16/2012   Procedure: COLONOSCOPY;  Surgeon: JWinfield Cunas, MD;  [facility];  Service: Endoscopy;  Laterality: N/A;    . ESOPHAGOGASTRODUODENOSCOPY N/A 02/16/2012   Procedure: ESOPHAGOGASTRODUODENOSCOPY (EGD);  Surgeon: JWinfield Cunas, MD;  [facility];  Service: Endoscopy;  Laterality: N/A;  . THYROIDECTOMY     in 2000    Allergies  Allergen Reactions  . Losartan Swelling    angioedema    Outpatient Encounter Medications as of 07/31/2017  Medication Sig  . alendronate (FOSAMAX) 70 MG tablet Take 70 mg by mouth every Tuesday. Take with a full glass of water on an empty stomach.   .Marland KitchenamLODipine (NORVASC) 5 MG tablet Take 5 mg by mouth daily.  . calcium-vitamin D (OSCAL WITH D) 500-200 MG-UNIT per tablet Take 1 tablet by mouth daily.  .Marland Kitchendonepezil (ARICEPT) 5 MG tablet Take 1 tablet (5 mg total) by mouth at bedtime.  .Marland KitchenGLUCERNA (GLUCERNA) LIQD Take 120 mLs by mouth 3 (three) times daily between meals. With meals   . hydrALAZINE (APRESOLINE) 10 MG tablet Take 10 mg by mouth 3 (three) times daily.  . insulin aspart (NOVOLOG FLEXPEN) 100 UNIT/ML FlexPen Inject 8 Units into the skin 3 (three) times daily with meals. <60 or >400 notify MD  . Insulin Glargine (LANTUS SOLOSTAR) 100 UNIT/ML Solostar Pen Inject 24 Units into the skin daily at 10 pm.   . linagliptin (TRADJENTA) 5 MG TABS tablet Take 5 mg by mouth daily.  . memantine (NAMENDA XR) 28 MG CP24 24 hr capsule Take 28 mg by mouth daily.  .Marland Kitchen  metFORMIN (GLUCOPHAGE-XR) 500 MG 24 hr tablet Take 500 mg by mouth every evening.  . metoprolol succinate (TOPROL-XL) 25 MG 24 hr tablet Take 25 mg by mouth daily.  . mirtazapine (REMERON) 15 MG tablet Take 15 mg by mouth at bedtime.  . Multiple Vitamin (MULTIVITAMIN) tablet Take 1 tablet by mouth daily.  . Nutritional Supplements (NUTRITIONAL SUPPLEMENT PO) Low Concentrated Sweets diet - Pureed texture, Regular consistency  . omeprazole (PRILOSEC) 40 MG capsule Take 1 capsule (40 mg total) by mouth daily.   No facility-administered encounter medications on file as of 07/31/2017.     Review of Systems   Unable to perform ROS: Dementia    Immunization History  Administered Date(s) Administered  . Influenza Split 11/16/2010  . Influenza Whole 01/12/2009, 11/25/2009  . PPD Test 05/02/2017  . Pneumococcal Polysaccharide-23 01/12/2009  . Tdap 11/16/2010   Pertinent  Health Maintenance Due  Topic Date Due  . FOOT EXAM  05/04/2018 (Originally 07/20/1950)  . OPHTHALMOLOGY EXAM  05/04/2018 (Originally 07/20/1950)  . INFLUENZA VACCINE  08/03/2017  . HEMOGLOBIN A1C  10/25/2017  . URINE MICROALBUMIN  06/02/2018  . DEXA SCAN  Completed  . PNA vac Low Risk Adult  Discontinued   No flowsheet data found. Functional Status Survey:    Vitals:   07/31/17 1128  BP: 130/90  Pulse: (!) 141  Resp: 18  Temp: (!) 101.7 F (38.7 C)  SpO2: 98%  Weight: 121 lb 9.6 oz (55.2 kg)  Height: '5\' 1"'$  (1.549 m)   Body mass index is 22.98 kg/m. Physical Exam  Constitutional: She appears well-developed. She appears lethargic.  Frail appearing in NAD, lying in bed. She is nonverbal but responds to sternal rub by opening eyes and grimacing  HENT:  MM dry; no oral thrush  Eyes: Pupils are equal, round, and reactive to light. No scleral icterus.  Neck: Neck supple.  Cardiovascular: Regular rhythm and intact distal pulses. Exam reveals no gallop and no friction rub.  Murmur (1/6 SEM) heard. Tachy @ 128 bpm; no LE edema b/l. No calf TTP  Pulmonary/Chest: Breath sounds normal. No stridor. No respiratory distress. She has no wheezes. She has no rales. She exhibits no tenderness.  CTA anteriorly  Abdominal: Soft. Bowel sounds are normal. She exhibits no distension and no mass. There is no tenderness. There is no rebound and no guarding. No hernia.  Feels full on left but no obvious mass palpable; no suprapubic TTP  Lymphadenopathy:    She has no cervical adenopathy.  Neurological: She appears lethargic.  Skin: Skin is warm and dry.  (+) tenting; left hand with large firm bullae; no redness  Psychiatric: She  has a normal mood and affect. Her behavior is normal. Thought content normal. Cognition and memory are impaired. She is noncommunicative.    Labs reviewed: Recent Labs    04/25/17 0051  07/08/17 0825  07/09/17 0414 07/09/17 1343 07/10/17 0502 07/17/17  NA 143   < > 157*   < > 151* 150* 144 144  K 4.2   < > 3.7  --  3.1*  --  3.4* 3.8  CL 111   < > 124*  --  121*  --  115*  --   CO2 22   < > 27  --  23  --  25  --   GLUCOSE 105*   < > 178*  --  199*  --  202*  --   BUN 24*   < > 32*  --  20  --  15 18  CREATININE 0.97   < > 1.26*  --  0.86  --  0.78 0.8  CALCIUM 8.6*   < > 8.3*  --  8.2*  --  8.1*  --   MG 1.7  --   --   --   --   --   --   --   PHOS 2.2*  --   --   --   --   --   --   --    < > = values in this interval not displayed.   Recent Labs    07/07/17 2024 07/08/17 0107 07/08/17 0353  AST 35 38 30  ALT '30 27 22  '$ ALKPHOS 104 84 73  BILITOT 1.3* 1.5* 1.0  PROT 8.1 6.6 5.8*  ALBUMIN 3.5 2.9* 2.5*   Recent Labs    07/08/17 0107 07/08/17 0353 07/09/17 0414 07/17/17  WBC 14.6* 12.6* 9.9 11.2  NEUTROABS 7.4 6.9  --  7  HGB 12.7 11.7* 11.4* 11.2*  HCT 40.4 37.4 36.6 34*  MCV 101.3* 101.1* 100.5*  --   PLT 194 174 156 328   Lab Results  Component Value Date   TSH 2.247 04/25/2017   Lab Results  Component Value Date   HGBA1C 5.6 04/25/2017   Lab Results  Component Value Date   CHOL 206 (H) 01/12/2009   HDL 57 01/12/2009   LDLCALC 115 (H) 01/12/2009   LDLDIRECT 111 (H) 04/25/2011   TRIG 169 (H) 01/12/2009   CHOLHDL 3.6 Ratio 01/12/2009    Significant Diagnostic Results in last 30 days:  Dg Chest 2 View  Result Date: 07/07/2017 CLINICAL DATA:  Abnormal labs and fever. Elevated white cell count. Weakness and lethargy for 2 days. History of dementia. EXAM: CHEST - 2 VIEW COMPARISON:  04/24/2017 FINDINGS: Shallow inspiration with linear atelectasis or fibrosis in the lung bases. This is similar to previous study. No airspace disease or consolidation in the  lungs. No blunting of costophrenic angles. No pneumothorax. Mediastinal contours appear intact. Heart size and pulmonary vascularity are normal. IMPRESSION: Shallow inspiration with linear atelectasis or fibrosis in the lung bases. No focal consolidation. Electronically Signed   By: Lucienne Capers M.D.   On: 07/07/2017 21:20    Assessment/Plan   ICD-10-CM   1. Dehydration E86.0   2. Altered mental status, unspecified altered mental status type R41.82   3. Fever, unspecified fever cause R50.9   4. Leukocytosis, unspecified type D72.829   5. Hypernatremia E87.0   6. Late onset Alzheimer's disease with behavioral disturbance G30.1    F02.81      START NSS VIA IV  X 2 LITERS THE RUN @ 80 CC/HR X 2 LITERS. GET PERIPHERAL IV WHILE AWAIT PLACEMENT OF MIDLINE  STAT LABS ORDERED (CBC, BMP, UA C&S VIA IN/OUT CATH, BLOOD CX X 2) - WBC 18.5K with 12.5K abs neutrophils; Hgb 11.6; Na 154; K 3.6; HCO3 level 23; Cr 0.95; BUN 25; Tbili 2.3; albumin 3.2; alk phos 136.  START ROCEPHIN 1 GM IV DAILY X 7 DAYS IN LIGHT OF LAB RESULTS - AWAIT BC RESULTS  Give probiotic '250mg'$  BID x 2 weeks   PALLIATIVE CARE REFERRAL DUE TO CHANGE IN CONDITION  CHECK CXR TO R/O PNEUMONIA, CHF  Will follow  Labs/tests ordered: CXR pending   Jazma Pickel S. Perlie Gold  [facility] [address] [place], [place] [address] [phone] Cell (Monday-Friday 8  AM - 5 PM) [phone] After 5 PM and follow prompts

## 2017-08-02 ENCOUNTER — Non-Acute Institutional Stay (SKILLED_NURSING_FACILITY): Payer: Medicare Other | Admitting: Adult Health

## 2017-08-02 ENCOUNTER — Non-Acute Institutional Stay: Payer: Self-pay | Admitting: Internal Medicine

## 2017-08-02 ENCOUNTER — Encounter: Payer: Self-pay | Admitting: Adult Health

## 2017-08-02 DIAGNOSIS — Z515 Encounter for palliative care: Secondary | ICD-10-CM

## 2017-08-02 DIAGNOSIS — N179 Acute kidney failure, unspecified: Secondary | ICD-10-CM

## 2017-08-02 DIAGNOSIS — G301 Alzheimer's disease with late onset: Secondary | ICD-10-CM | POA: Diagnosis not present

## 2017-08-02 DIAGNOSIS — E870 Hyperosmolality and hypernatremia: Secondary | ICD-10-CM | POA: Diagnosis not present

## 2017-08-02 DIAGNOSIS — R531 Weakness: Secondary | ICD-10-CM

## 2017-08-02 DIAGNOSIS — F0281 Dementia in other diseases classified elsewhere with behavioral disturbance: Secondary | ICD-10-CM | POA: Diagnosis not present

## 2017-08-02 LAB — CBC AND DIFFERENTIAL
HEMATOCRIT: 33 — AB (ref 36–46)
HEMOGLOBIN: 10.6 — AB (ref 12.0–16.0)
NEUTROS ABS: 8
Platelets: 270 (ref 150–399)
WBC: 12.7

## 2017-08-02 LAB — BASIC METABOLIC PANEL
BUN: 17 (ref 4–21)
Creatinine: 0.4 — AB (ref 0.5–1.1)
GLUCOSE: 153
Potassium: 3.7 (ref 3.4–5.3)
SODIUM: 158 — AB (ref 137–147)

## 2017-08-02 LAB — HEPATIC FUNCTION PANEL
ALT: 20 (ref 7–35)
AST: 28 (ref 13–35)
Alkaline Phosphatase: 116 (ref 25–125)
BILIRUBIN, TOTAL: 0.5

## 2017-08-02 NOTE — Progress Notes (Signed)
PALLIATIVE CARE CONSULT VISIT   PATIENT NAME: Michaela BridgeMary H [name] DOB: 09/27/1940 MRN: [id number]  PRIMARY CARE PROVIDER:   Dr. Laurann Montanaynthia White  REFERRING PROVIDER:  Wyatt Portelaebbie Green, NP               [facility]  RESPONSIBLE PARTY:    Elonda HuskyCassandra Hasley(daughter)  [phone]    RECOMMENDATIONS and PLAN:  1.  Weakness generalized  R53.1:  Multifactorial as related to advanced dementia, dehydration, angioedema and weight loss,   FAST stage 7c.  Bedbound.  9# weight loss since April 2019.  Current weight is 121 as of 07/25/17   Albumin is 2.8 without improvement from use of Remeron.  Pt is appropriate for comfort/Hospice Care and recommendations will be explained to daughter.  2.  Palliative care encounter Z51.5:  Full Code.  Patient is in an advanced state of decline. Joint phone discussion with daughter and Wyatt PortelaDebbie Green, NP to discuss current pt status and goals of care.  Attempted to review advanced care planning.  Daughter requested time to speak to her siblings today prior to any further discussion related to planning future care for [name].  I spent 60 minutes providing this consultation,  From 11:30am to 12:30pm at [facility]. More than 50% of the time in this consultation was spent coordinating communication patient, NP and daughter Elonda HuskyCassandra via phone.   HISTORY OF PRESENT ILLNESS:  Michaela BridgeMary H [name] is a 77 y.o. year old female with multiple medical problems including alzheimer's, T2 diabetes. She was admitted to the hospital from 7/5-07/10/17 due to uro sepsis with fever, hyponatremia, dehydration and metabolic encephalopathy.  She was also admitted in March and April of this year for dehydration, angioedema and pneumonia.  Staff reports that she has additional cognitive decline with pocketing of food, little oral hydration, non-ambulatory along with being totally dependent upon staff for all ADLs.  She is incontinent of B&B.  She has been febrile this week and required IV infusion(4L)  due to dehydration.  Unable to assess urine sample thus far. A CXR has been evaluated already without reporst of PNA.  .   Palliative Care was asked to help address goals of care and advanced care planning.  CODE STATUS: FULL CODE  PPS: 20%  Sips only.  Pocketing food HOSPICE ELIGIBILITY/DIAGNOSIS: YES/ protein calorie malnutrition/advanced dementia  PAST MEDICAL HISTORY:  Past Medical History:  Diagnosis Date  . 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 Anxiety   . Blepharitis   . Dementia   . Depression   . Diabetes mellitus   . Essential hypertension 01/12/2009   Qualifier: Diagnosis of  By: Alvester MorinNewton MD, Viviann SpareSteven    . GERD (gastroesophageal reflux disease)   . Heart murmur, systolic   . Hepatitis   . Hyperlipidemia   . Hypertension   . Osteoporosis   . Urinary frequency     SOCIAL HX:  Social History   Tobacco Use  . Smoking status: Never Smoker  . Smokeless tobacco: Never Used  Substance Use Topics  . Alcohol use: No    Alcohol/week: 0.0 oz    ALLERGIES:  Allergies  Allergen Reactions  . Losartan Swelling    angioedema     PERTINENT MEDICATIONS:  Outpatient Encounter Medications as of 08/02/2017  Medication Sig  . alendronate (FOSAMAX) 70 MG tablet Take 70 mg by mouth every Tuesday. Take with a full glass of water on an empty stomach.   Marland Kitchen. amLODipine (NORVASC) 5 MG tablet Take 5 mg by mouth daily.  .Marland Kitchen  calcium-vitamin D (OSCAL WITH D) 500-200 MG-UNIT per tablet Take 1 tablet by mouth daily.  Marland Kitchen donepezil (ARICEPT) 5 MG tablet Take 1 tablet (5 mg total) by mouth at bedtime.  Marland Kitchen GLUCERNA (GLUCERNA) LIQD Take 120 mLs by mouth 3 (three) times daily between meals. With meals   . hydrALAZINE (APRESOLINE) 10 MG tablet Take 10 mg by mouth 3 (three) times daily.  . insulin aspart (NOVOLOG FLEXPEN) 100 UNIT/ML FlexPen Inject 8 Units into the skin 3 (three) times daily with meals. <60 or >400 notify MD  . Insulin Glargine (LANTUS SOLOSTAR) 100 UNIT/ML Solostar Pen Inject 24  Units into the skin daily at 10 pm.   . linagliptin (TRADJENTA) 5 MG TABS tablet Take 5 mg by mouth daily.  . memantine (NAMENDA XR) 28 MG CP24 24 hr capsule Take 28 mg by mouth daily.  . metFORMIN (GLUCOPHAGE-XR) 500 MG 24 hr tablet Take 500 mg by mouth every evening.  . metoprolol succinate (TOPROL-XL) 25 MG 24 hr tablet Take 25 mg by mouth daily.  . mirtazapine (REMERON) 15 MG tablet Take 15 mg by mouth at bedtime.  . Multiple Vitamin (MULTIVITAMIN) tablet Take 1 tablet by mouth daily.  . Nutritional Supplements (NUTRITIONAL SUPPLEMENT PO) Low Concentrated Sweets diet - Pureed texture, Regular consistency  . omeprazole (PRILOSEC) 40 MG capsule Take 1 capsule (40 mg total) by mouth daily.   No facility-administered encounter medications on file as of 08/02/2017.     PHYSICAL EXAM:   BP 118/70  P 100  R 24  T99.1 axillary  Sats 98% on rm air  General: Cachectic. Frail and fragile appearing, thin. Cardiovascular: Systolic murmur, regular rhythm with tachycardia Pulmonary: clear ant fields Abdomen: flat, soft, nontender, + bowel sounds GU: no suprapubic tenderness Extremities: Mild edema L hand.  PICC line in place at RUE. Skin: Warm and dry. Exposed skin is intact Neurological: Alert and speaks in 1-2 words.  Unable to determine orientation due to cognitive deficits.  Does not follow commands.Weakness   Margaretha Sheffield, NP

## 2017-08-02 NOTE — Progress Notes (Signed)
Location:   [facility] Room Number: 222 B Place of Service:  SNF (31)   CODE STATUS: Full Code  Allergies  Allergen Reactions  . Losartan Swelling    angioedema    Chief Complaint  Patient presents with  . Acute Visit    Folllow up status    HPI:  Palliative care and myself have spoken with her daughter regarding her status. She continues to lose weight. Her daughter at this time desires for her to remain a full code. She continues to hope to get "her mother back". Her po intake is very poor; she is unable to participate in the hpi or ros. There are no reports of cough; shortness of breath; or uncontrolled pain.   Past Medical History:  Diagnosis Date  . 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 Anxiety   . Blepharitis   . Dementia   . Depression   . Diabetes mellitus   . Essential hypertension 01/12/2009   Qualifier: Diagnosis of  By: Alvester Morin MD, Viviann Spare    . GERD (gastroesophageal reflux disease)   . Heart murmur, systolic   . Hepatitis   . Hyperlipidemia   . Hypertension   . Osteoporosis   . Urinary frequency     Past Surgical History:  Procedure Laterality Date  . COLONOSCOPY N/A 02/16/2012   Procedure: COLONOSCOPY;  Surgeon: Vertell Novak., MD;  Location: [facility] ENDOSCOPY;  Service: Endoscopy;  Laterality: N/A;  . ESOPHAGOGASTRODUODENOSCOPY N/A 02/16/2012   Procedure: ESOPHAGOGASTRODUODENOSCOPY (EGD);  Surgeon: Vertell Novak., MD;  Location: [facility] ENDOSCOPY;  Service: Endoscopy;  Laterality: N/A;  . THYROIDECTOMY     in 2000    Social History   Socioeconomic History  . Marital status: Widowed    Spouse name: Not on file  . Number of children: Not on file  . Years of education: 71  . Highest education level: Not on file  Occupational History  . Not on file  Social Needs  . Financial resource strain: Not on file  . Food insecurity:    Worry: Not on file    Inability: Not on file  . Transportation needs:    Medical: Not on  file    Non-medical: Not on file  Tobacco Use  . Smoking status: Never Smoker  . Smokeless tobacco: Never Used  Substance and Sexual Activity  . Alcohol use: No    Alcohol/week: 0.0 oz  . Drug use: No  . Sexual activity: Never  Lifestyle  . Physical activity:    Days per week: Not on file    Minutes per session: Not on file  . Stress: Not on file  Relationships  . Social connections:    Talks on phone: Not on file    Gets together: Not on file    Attends religious service: Not on file    Active member of club or organization: Not on file    Attends meetings of clubs or organizations: Not on file    Relationship status: Not on file  . Intimate partner violence:    Fear of current or ex partner: Not on file    Emotionally abused: Not on file    Physically abused: Not on file    Forced sexual activity: Not on file  Other Topics Concern  . Not on file  Social History Narrative   Lives with daughter and grand-daughter to help informally supervise pt at home.   Pt also in adult daycare during the week.  Family History  Problem Relation Age of Onset  . Cancer Sister        brain  . Diabetes Son   . Hypertension Son   . Diabetes Daughter       VITAL SIGNS BP 118/70   Pulse 100   Temp 99.1 F (37.3 C) Comment: axillary  Resp 20   Ht 5\' 1"  (1.549 m)   Wt 121 lb 9.6 oz (55.2 kg)   SpO2 97%   BMI 22.98 kg/m   Outpatient Encounter Medications as of 08/02/2017  Medication Sig  . alendronate (FOSAMAX) 70 MG tablet Take 70 mg by mouth every Tuesday. Take with a full glass of water on an empty stomach.   Marland Kitchen amLODipine (NORVASC) 5 MG tablet Take 5 mg by mouth daily.  . calcium-vitamin D (OSCAL WITH D) 500-200 MG-UNIT per tablet Take 1 tablet by mouth daily.  . cefTRIAXone 1 g in dextrose 5 % 50 mL Inject 1 g into the vein every evening. X 7 days  . donepezil (ARICEPT) 5 MG tablet Take 1 tablet (5 mg total) by mouth at bedtime.  Marland Kitchen GLUCERNA (GLUCERNA) LIQD Take 120 mLs by  mouth 3 (three) times daily between meals. With meals   . hydrALAZINE (APRESOLINE) 10 MG tablet Take 10 mg by mouth 3 (three) times daily.  . insulin aspart (NOVOLOG FLEXPEN) 100 UNIT/ML FlexPen Inject 8 Units into the skin 3 (three) times daily with meals. <60 or >400 notify MD  . Insulin Glargine (LANTUS SOLOSTAR) 100 UNIT/ML Solostar Pen Inject 24 Units into the skin daily at 10 pm.   . linagliptin (TRADJENTA) 5 MG TABS tablet Take 5 mg by mouth daily.  . memantine (NAMENDA XR) 28 MG CP24 24 hr capsule Take 28 mg by mouth daily.  . metFORMIN (GLUCOPHAGE-XR) 500 MG 24 hr tablet Take 500 mg by mouth every evening.  . metoprolol succinate (TOPROL-XL) 25 MG 24 hr tablet Take 25 mg by mouth daily.  . mirtazapine (REMERON) 15 MG tablet Take 15 mg by mouth at bedtime.  . Multiple Vitamin (MULTIVITAMIN) tablet Take 1 tablet by mouth daily.  . Nutritional Supplements (NUTRITIONAL SUPPLEMENT PO) Low Concentrated Sweets diet - Pureed texture, Regular consistency  . omeprazole (PRILOSEC) 40 MG capsule Take 1 capsule (40 mg total) by mouth daily.  . Povidone-Iodine (BETADINE EX) Apply to left wrist topically daily for wound and wrap with Kerlix  . Probiotic Product (PROBIOTIC DAILY) CAPS Take 1 capsule by mouth 2 (two) times daily.   No facility-administered encounter medications on file as of 08/02/2017.      SIGNIFICANT DIAGNOSTIC EXAMS  PREVIOUS:  04-24-17: chest x-ray: No active cardiopulmonary disease.   04-24-17: ct of head: Atrophy and small vessel disease, progressive since 2007. No acute intracranial findings.  04-24-17: ct of abdomen and pelvis: 1. Mild diffuse small bowel distention without mechanical bowel obstruction compatible with a mild small bowel enteritis. 2. Coronary arteriosclerosis. 3. Punctate nonobstructing right lower pole renal calculus. No hydroureteronephrosis. 4. Fibroid uterus.  07-07-17: Chest x-ray: Shallow inspiration with linear atelectasis or fibrosis in the lung  bases. No focal consolidation.   NO NEW EXAMS    LABS REVIEWED; PEVIOUS  04-24-17: wbc 9.5; hgb 15.1; hct 45.0; mcv 247; plt 247 glucose 177 bun 30; creat 1.04; k+ 4.7; na++ 145; ca 9.7; liver normal albumin 4.0; urine culture no growth 04-25-17: wbc 7.3; hgb 13.3; hct 40.0; mcv 95.7; plt 194; glucose 105; bun 24; creat 0.97; k+ 4.2; na++ 143; ca 8.6;  liver normal albumin 3.2; mag 1.7; phos 2.2; hgb a1c 5.6  05-04-17: wbc 14.2; hgb 14.1; hct 42.7; mcv 95.2; plt 214; glucose 275; bun 18.5; creat 0.86; k+ 4.4; an++ 140; ca 9.4  06-01-17: urine micro-albumin 1.2 06-29-17: wbc 8.1; hgb 12.2; hct 37; plt 192; vit B 12: 952 07-07-17: wbc 16.8; hgb 14.2 hct 44.9; mcv 100.7; plt 194; glucose 290; bun 38; creat 1.19; k+ 4.1; na++ 158; ca 9.8; total bili 1.3; albumin 3.5 07-08-17: wbc 12.6; hgb 11.7; hct 37.4; mcv 101.1; plt 174 glucose  280; bun 34; creat 1.19; k+ 3.6; na++ 155; ca 8.1; liver normal albumin 2.5 07-10-17: glucose 202; bun 15; creat 0.78; k+ 3.4; na++144 07-17-17: wbc 11.2; hgb 11.2; hct 34; plt 328; glucose 310; bun 18; creat 0.8; k+ 3.8; na++144  TODAY ;  08-02-17: wbc 12.7; hgb 10.6; hct 32.6; mcv 95.7; plt 270; glucose 153; bun 17.1; creat 0.43; k+ 3.7; na++ 158; liver normal albumin 2.8     Review of Systems  Unable to perform ROS: Dementia (nonverbal )    Physical Exam  Constitutional: No distress.  Frail   Neck: No thyromegaly present.  Cardiovascular: Normal rate, regular rhythm, normal heart sounds and intact distal pulses.  Pulmonary/Chest: Effort normal and breath sounds normal. No respiratory distress.  Abdominal: Soft. Bowel sounds are normal. She exhibits no distension. There is no tenderness.  Musculoskeletal: She exhibits no edema.  Is not moving extremities   Lymphadenopathy:    She has no cervical adenopathy.  Neurological:  Is not aware   Skin: Skin is warm and dry. She is not diaphoretic.  Psychiatric:  Sleeping      ASSESSMENT/ PLAN:  TODAY:   1.  Dementia, alzheimer's with behavior disturbance;  2. Acute renal failure 3. Hypernatremia  Her status is declining  Will stop the following medications: novolog; fosamax; aricept; tradjenta; metformin; remeron;  Will begin 1/2 NS 100 cc per hour for 2 liters On 08-03-17; will repeat BMP stat   MD is aware of resident's narcotic use and is in agreement with current plan of care. We will attempt to wean resident as apropriate   Synthia Innocenteborah Green NP [facility]  Contact [phone] Monday through Friday 8am- 5pm  After hours call [phone]

## 2017-08-03 LAB — BASIC METABOLIC PANEL
BUN: 15 (ref 4–21)
CREATININE: 0.6 (ref 0.5–1.1)
GLUCOSE: 306
POTASSIUM: 3.9 (ref 3.4–5.3)
Sodium: 153 — AB (ref 137–147)

## 2017-08-04 ENCOUNTER — Non-Acute Institutional Stay (SKILLED_NURSING_FACILITY): Payer: Medicare Other | Admitting: Adult Health

## 2017-08-04 ENCOUNTER — Encounter: Payer: Self-pay | Admitting: Adult Health

## 2017-08-04 DIAGNOSIS — K219 Gastro-esophageal reflux disease without esophagitis: Secondary | ICD-10-CM

## 2017-08-04 DIAGNOSIS — G301 Alzheimer's disease with late onset: Secondary | ICD-10-CM | POA: Diagnosis not present

## 2017-08-04 DIAGNOSIS — F0281 Dementia in other diseases classified elsewhere with behavioral disturbance: Secondary | ICD-10-CM

## 2017-08-04 DIAGNOSIS — F324 Major depressive disorder, single episode, in partial remission: Secondary | ICD-10-CM

## 2017-08-04 DIAGNOSIS — E870 Hyperosmolality and hypernatremia: Secondary | ICD-10-CM | POA: Diagnosis not present

## 2017-08-04 DIAGNOSIS — F02818 Dementia in other diseases classified elsewhere, unspecified severity, with other behavioral disturbance: Secondary | ICD-10-CM

## 2017-08-04 NOTE — Progress Notes (Signed)
[facility] Room Number: 222B Place of Service:  SNF (31)   CODE STATUS: FULL  Allergies  Allergen Reactions  . Losartan Swelling    angioedema    Chief Complaint  Patient presents with  . Medical Management of Chronic Issues    Gerd; dementia; depression hypernatremia. Weekly follow up for the first 30 days post hospitalization     HPI:  She is a long term resident of this facility being seen for the management of her chronic illnesses; gerd; dementia; depression. She is not eating or drinking; with very little po intake. There are no reports of fevers present. She is not responsive to verbal stimuli. I have spoken with her daughter at great length regarding her status. She wants her mother to remain a full code. We will try IV fluids and will continue to monitor her status.   Past Medical History:  Diagnosis Date  . 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 Anxiety   . Blepharitis   . Dementia   . Depression   . Diabetes mellitus   . Essential hypertension 01/12/2009   Qualifier: Diagnosis of  By: Alvester MorinNewton MD, Viviann SpareSteven    . GERD (gastroesophageal reflux disease)   . Heart murmur, systolic   . Hepatitis   . Hyperlipidemia   . Hypertension   . Osteoporosis   . Urinary frequency     Past Surgical History:  Procedure Laterality Date  . COLONOSCOPY N/A 02/16/2012   Procedure: COLONOSCOPY;  Surgeon: Vertell NovakJames L Edwards Jr., MD;  [facility];  Service: Endoscopy;  Laterality: N/A;  . ESOPHAGOGASTRODUODENOSCOPY N/A 02/16/2012   Procedure: ESOPHAGOGASTRODUODENOSCOPY (EGD);  Surgeon: Vertell NovakJames L Edwards Jr., MD;  [facility];  Service: Endoscopy;  Laterality: N/A;  . THYROIDECTOMY     in 2000    Social History   Socioeconomic History  . Marital status: Widowed    Spouse name: Not on file  . Number of children: Not on file  . Years of education: 3812  . Highest education level: Not on file  Occupational History  . Not on  file  Social Needs  . Financial resource strain: Not on file  . Food insecurity:    Worry: Not on file    Inability: Not on file  . Transportation needs:    Medical: Not on file    Non-medical: Not on file  Tobacco Use  . Smoking status: Never Smoker  . Smokeless tobacco: Never Used  Substance and Sexual Activity  . Alcohol use: No    Alcohol/week: 0.0 oz  . Drug use: No  . Sexual activity: Never  Lifestyle  . Physical activity:    Days per week: Not on file    Minutes per session: Not on file  . Stress: Not on file  Relationships  . Social connections:    Talks on phone: Not on file    Gets together: Not on file    Attends religious service: Not on file    Active member of club or organization: Not on file    Attends meetings of clubs or organizations: Not on file    Relationship status: Not on file  . Intimate partner violence:    Fear of current or ex partner: Not on file    Emotionally abused: Not on file    Physically abused: Not on file    Forced sexual activity: Not on file  Other Topics Concern  . Not on file  Social History  Narrative   Lives with daughter and grand-daughter to help informally supervise pt at home.   Pt also in adult daycare during the week.     Family History  Problem Relation Age of Onset  . Cancer Sister        brain  . Diabetes Son   . Hypertension Son   . Diabetes Daughter       VITAL SIGNS BP 130/90   Pulse (!) 137   Temp (!) 101.7 F (38.7 C) (Oral)   Resp 20   Ht 5\' 1"  (1.549 m)   Wt 121 lb 9.6 oz (55.2 kg)   SpO2 97%   BMI 22.98 kg/m   Outpatient Encounter Medications as of 08/04/2017  Medication Sig  . alendronate (FOSAMAX) 70 MG tablet Take 70 mg by mouth every Tuesday. Take with a full glass of water on an empty stomach.   Marland Kitchen amLODipine (NORVASC) 5 MG tablet Take 5 mg by mouth daily.  . calcium-vitamin D (OSCAL WITH D) 500-200 MG-UNIT per tablet Take 1 tablet by mouth daily.  . cefTRIAXone 1 g in dextrose 5 % 50 mL  Inject 1 g into the vein every evening. X 7 days  . donepezil (ARICEPT) 5 MG tablet Take 1 tablet (5 mg total) by mouth at bedtime.  Marland Kitchen GLUCERNA (GLUCERNA) LIQD Take 120 mLs by mouth 3 (three) times daily between meals. With meals   . hydrALAZINE (APRESOLINE) 10 MG tablet Take 10 mg by mouth 3 (three) times daily.  . metoprolol succinate (TOPROL-XL) 25 MG 24 hr tablet Take 25 mg by mouth daily.  . Multiple Vitamin (MULTIVITAMIN) tablet Take 1 tablet by mouth daily.  . Nutritional Supplements (NUTRITIONAL SUPPLEMENT PO) Low Concentrated Sweets diet - Pureed texture, Regular consistency  . omeprazole (PRILOSEC) 40 MG capsule Take 1 capsule (40 mg total) by mouth daily.  . Povidone-Iodine (BETADINE EX) Apply to left wrist topically daily for wound and wrap with Kerlix  . Probiotic Product (PROBIOTIC DAILY) CAPS Take 1 capsule by mouth 2 (two) times daily.  . [DISCONTINUED] insulin aspart (NOVOLOG FLEXPEN) 100 UNIT/ML FlexPen Inject 8 Units into the skin 3 (three) times daily with meals. <60 or >400 notify MD  . [DISCONTINUED] Insulin Glargine (LANTUS SOLOSTAR) 100 UNIT/ML Solostar Pen Inject 24 Units into the skin daily at 10 pm.   . [DISCONTINUED] linagliptin (TRADJENTA) 5 MG TABS tablet Take 5 mg by mouth daily.  . [DISCONTINUED] memantine (NAMENDA XR) 28 MG CP24 24 hr capsule Take 28 mg by mouth daily.  . [DISCONTINUED] metFORMIN (GLUCOPHAGE-XR) 500 MG 24 hr tablet Take 500 mg by mouth every evening.  . [DISCONTINUED] mirtazapine (REMERON) 15 MG tablet Take 15 mg by mouth at bedtime.   No facility-administered encounter medications on file as of 08/04/2017.      SIGNIFICANT DIAGNOSTIC EXAMS   PREVIOUS:  04-24-17: chest x-ray: No active cardiopulmonary disease.   04-24-17: ct of head: Atrophy and small vessel disease, progressive since 2007. No acute intracranial findings.  04-24-17: ct of abdomen and pelvis: 1. Mild diffuse small bowel distention without mechanical bowel obstruction  compatible with a mild small bowel enteritis. 2. Coronary arteriosclerosis. 3. Punctate nonobstructing right lower pole renal calculus. No hydroureteronephrosis. 4. Fibroid uterus.  07-07-17: Chest x-ray: Shallow inspiration with linear atelectasis or fibrosis in the lung bases. No focal consolidation.   NO NEW EXAMS    LABS REVIEWED; PEVIOUS  04-24-17: wbc 9.5; hgb 15.1; hct 45.0; mcv 247; plt 247 glucose 177 bun 30; creat  1.04; k+ 4.7; na++ 145; ca 9.7; liver normal albumin 4.0; urine culture no growth 04-25-17: wbc 7.3; hgb 13.3; hct 40.0; mcv 95.7; plt 194; glucose 105; bun 24; creat 0.97; k+ 4.2; na++ 143; ca 8.6; liver normal albumin 3.2; mag 1.7; phos 2.2; hgb a1c 5.6  05-04-17: wbc 14.2; hgb 14.1; hct 42.7; mcv 95.2; plt 214; glucose 275; bun 18.5; creat 0.86; k+ 4.4; an++ 140; ca 9.4  06-01-17: urine micro-albumin 1.2 06-29-17: wbc 8.1; hgb 12.2; hct 37; plt 192; vit B 12: 952 07-07-17: wbc 16.8; hgb 14.2 hct 44.9; mcv 100.7; plt 194; glucose 290; bun 38; creat 1.19; k+ 4.1; na++ 158; ca 9.8; total bili 1.3; albumin 3.5 07-08-17: wbc 12.6; hgb 11.7; hct 37.4; mcv 101.1; plt 174 glucose  280; bun 34; creat 1.19; k+ 3.6; na++ 155; ca 8.1; liver normal albumin 2.5 07-10-17: glucose 202; bun 15; creat 0.78; k+ 3.4; na++144 07-17-17: wbc 11.2; hgb 11.2; hct 34; plt 328; glucose 310; bun 18; creat 0.8; k+ 3.8; na++144 08-02-17: wbc 12.7; hgb 10.6; hct 32.6; mcv 95.7; plt 270; glucose 153; bun 17.1; creat 0.43; k+ 3.7; na++ 158; liver normal albumin 2.8   TODAY:   08-03-17: glucose 30; bun 15.4; creat 0.57; k+ 3.7; na++ 153; ca 9.0     Review of Systems  Unable to perform ROS: Dementia (nonverbal )    Physical Exam  Constitutional: No distress.  Frail   Neck: No thyromegaly present.  Cardiovascular: Normal rate, regular rhythm, normal heart sounds and intact distal pulses.  Pulmonary/Chest: Effort normal and breath sounds normal. No respiratory distress.  Abdominal: Soft. Bowel sounds are  normal. She exhibits no distension. There is no tenderness.  Musculoskeletal: She exhibits no edema.  Is not moving extremities   Lymphadenopathy:    She has no cervical adenopathy.  Neurological:  Not aware   Skin: Skin is warm and dry. She is not diaphoretic.    ASSESSMENT/ PLAN:   TODAY:   1. Dementia, alzheimer's with behavior disturbance; without change weight is 121 pounds is losing weight ; will continue aricept 5 mg daily   2.  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 gerd without esophagitis: stable will continue prilosec 40 mg daily   4. Hypernatremia: is slightly improved: will give 1/2 NS 100 cc per hour for 3 liters and will check bmp   PREVIOUS  5. Osteoporosis, age related: stable will continue fosamax 70 mg weekly and is on calcium supplement.   6. Essential hypertension: stable b/p 130/76: will continue norvasc 5 mg daily; toprol xl 25 mg daily  and apresoline 10 mg three times daily   7. Insulin dependent type 2 diabetes mellitus uncontrolled: is stable   hgb a1c 5.6; is presently off medications.     MD is aware of resident's narcotic use and is in agreement with current plan of care. We will attempt to wean resident as apropriate   Synthia Innocent NP [facility]  Contact [phone] Monday through Friday 8am- 5pm  After hours call [phone]

## 2017-08-06 DIAGNOSIS — E119 Type 2 diabetes mellitus without complications: Secondary | ICD-10-CM | POA: Insufficient documentation

## 2017-08-07 NOTE — Progress Notes (Signed)
[id number] 

## 2017-08-08 LAB — BASIC METABOLIC PANEL
BUN: 4 (ref 4–21)
Creatinine: 0.5 (ref 0.5–1.1)
GLUCOSE: 306
POTASSIUM: 3.7 (ref 3.4–5.3)
Sodium: 138 (ref 137–147)

## 2017-08-09 ENCOUNTER — Non-Acute Institutional Stay: Payer: Self-pay | Admitting: Internal Medicine

## 2017-08-09 DIAGNOSIS — Z515 Encounter for palliative care: Secondary | ICD-10-CM

## 2017-08-09 DIAGNOSIS — R531 Weakness: Secondary | ICD-10-CM

## 2017-08-10 ENCOUNTER — Encounter: Payer: Self-pay | Admitting: Adult Health

## 2017-08-10 ENCOUNTER — Non-Acute Institutional Stay (SKILLED_NURSING_FACILITY): Payer: Medicare Other | Admitting: Adult Health

## 2017-08-10 DIAGNOSIS — E1165 Type 2 diabetes mellitus with hyperglycemia: Secondary | ICD-10-CM | POA: Diagnosis not present

## 2017-08-10 DIAGNOSIS — I10 Essential (primary) hypertension: Secondary | ICD-10-CM

## 2017-08-10 DIAGNOSIS — IMO0002 Reserved for concepts with insufficient information to code with codable children: Secondary | ICD-10-CM

## 2017-08-10 DIAGNOSIS — E870 Hyperosmolality and hypernatremia: Secondary | ICD-10-CM

## 2017-08-10 DIAGNOSIS — Z794 Long term (current) use of insulin: Secondary | ICD-10-CM

## 2017-08-10 NOTE — Progress Notes (Signed)
Location:   [facility] Room Number: 222 B Place of Service:  SNF (31)   CODE STATUS: Full Code  Allergies  Allergen Reactions  . Losartan Swelling    angioedema    Chief Complaint  Patient presents with  . Medical Management of Chronic Issues    Hypertension; diabetes; hypernatremia     HPI:  She is a 77 year old long term resident of this facility being seen for the management of her chronic illnesses: hypertension; diabetes; hypernatremia. She is unable to participate in the hpi or ros. Her daughter has not yet made a decision regarding her code status. She has a very poor po intake. Her ivf have been stopped and will need repeat labs. There are no reports of uncontrolled pain; no agitation.   Past Medical History:  Diagnosis Date  . 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 Anxiety   . Blepharitis   . Dementia   . Depression   . Diabetes mellitus   . Essential hypertension 01/12/2009   Qualifier: Diagnosis of  By: Alvester Morin MD, Viviann Spare    . GERD (gastroesophageal reflux disease)   . Heart murmur, systolic   . Hepatitis   . Hyperlipidemia   . Hypertension   . Osteoporosis   . Urinary frequency     Past Surgical History:  Procedure Laterality Date  . COLONOSCOPY N/A 02/16/2012   Procedure: COLONOSCOPY;  Surgeon: Vertell Novak., MD;  Location: [facility] ENDOSCOPY;  Service: Endoscopy;  Laterality: N/A;  . ESOPHAGOGASTRODUODENOSCOPY N/A 02/16/2012   Procedure: ESOPHAGOGASTRODUODENOSCOPY (EGD);  Surgeon: Vertell Novak., MD;  Location: [facility] ENDOSCOPY;  Service: Endoscopy;  Laterality: N/A;  . THYROIDECTOMY     in 2000    Social History   Socioeconomic History  . Marital status: Widowed    Spouse name: Not on file  . Number of children: Not on file  . Years of education: 76  . Highest education level: Not on file  Occupational History  . Not on file  Social Needs  . Financial resource strain: Not on file  . Food insecurity:    Worry:  Not on file    Inability: Not on file  . Transportation needs:    Medical: Not on file    Non-medical: Not on file  Tobacco Use  . Smoking status: Never Smoker  . Smokeless tobacco: Never Used  Substance and Sexual Activity  . Alcohol use: No    Alcohol/week: 0.0 standard drinks  . Drug use: No  . Sexual activity: Never  Lifestyle  . Physical activity:    Days per week: Not on file    Minutes per session: Not on file  . Stress: Not on file  Relationships  . Social connections:    Talks on phone: Not on file    Gets together: Not on file    Attends religious service: Not on file    Active member of club or organization: Not on file    Attends meetings of clubs or organizations: Not on file    Relationship status: Not on file  . Intimate partner violence:    Fear of current or ex partner: Not on file    Emotionally abused: Not on file    Physically abused: Not on file    Forced sexual activity: Not on file  Other Topics Concern  . Not on file  Social History Narrative   Lives with daughter and grand-daughter to help informally supervise pt at home.  Pt also in adult daycare during the week.     Family History  Problem Relation Age of Onset  . Cancer Sister        brain  . Diabetes Son   . Hypertension Son   . Diabetes Daughter       VITAL SIGNS Ht 5\' 1"  (1.549 m)   Wt 117 lb (53.1 kg)   BMI 22.11 kg/m   Outpatient Encounter Medications as of 08/10/2017  Medication Sig  . amLODipine (NORVASC) 5 MG tablet Take 5 mg by mouth daily.  . calcium-vitamin D (OSCAL WITH D) 500-200 MG-UNIT per tablet Take 1 tablet by mouth daily.  . Emollient (SKIN REPAIR EX) Apply to blister on left wrist daily  . GLUCERNA (GLUCERNA) LIQD Take 120 mLs by mouth 3 (three) times daily between meals. With meals   . hydrALAZINE (APRESOLINE) 10 MG tablet Take 10 mg by mouth 3 (three) times daily.  . metoprolol succinate (TOPROL-XL) 25 MG 24 hr tablet Take 25 mg by mouth daily.  . Multiple  Vitamin (MULTIVITAMIN) tablet Take 1 tablet by mouth daily.  . Nutritional Supplements (NUTRITIONAL SUPPLEMENT PO) Low Concentrated Sweets diet - Pureed texture, Regular consistency  . Nutritional Supplements (NUTRITIONAL SUPPLEMENT PO) Low concentrated Sweets Diet - Pureed texture, Regular consistency  . omeprazole (PRILOSEC) 40 MG capsule Take 1 capsule (40 mg total) by mouth daily.  . Probiotic Product (PROBIOTIC DAILY) CAPS Take 1 capsule by mouth 2 (two) times daily.  . [DISCONTINUED] alendronate (FOSAMAX) 70 MG tablet Take 70 mg by mouth every Tuesday. Take with a full glass of water on an empty stomach.   . [DISCONTINUED] donepezil (ARICEPT) 5 MG tablet Take 1 tablet (5 mg total) by mouth at bedtime. (Patient not taking: Reported on 08/10/2017)  . [DISCONTINUED] Povidone-Iodine (BETADINE EX) Apply to left wrist topically daily for wound and wrap with Kerlix   No facility-administered encounter medications on file as of 08/10/2017.      SIGNIFICANT DIAGNOSTIC EXAMS   PREVIOUS:  04-24-17: chest x-ray: No active cardiopulmonary disease.   04-24-17: ct of head: Atrophy and small vessel disease, progressive since 2007. No acute intracranial findings.  04-24-17: ct of abdomen and pelvis: 1. Mild diffuse small bowel distention without mechanical bowel obstruction compatible with a mild small bowel enteritis. 2. Coronary arteriosclerosis. 3. Punctate nonobstructing right lower pole renal calculus. No hydroureteronephrosis. 4. Fibroid uterus.  07-07-17: Chest x-ray: Shallow inspiration with linear atelectasis or fibrosis in the lung bases. No focal consolidation.   NO NEW EXAMS    LABS REVIEWED; PEVIOUS  04-24-17: wbc 9.5; hgb 15.1; hct 45.0; mcv 247; plt 247 glucose 177 bun 30; creat 1.04; k+ 4.7; na++ 145; ca 9.7; liver normal albumin 4.0; urine culture no growth 04-25-17: wbc 7.3; hgb 13.3; hct 40.0; mcv 95.7; plt 194; glucose 105; bun 24; creat 0.97; k+ 4.2; na++ 143; ca 8.6; liver normal  albumin 3.2; mag 1.7; phos 2.2; hgb a1c 5.6  05-04-17: wbc 14.2; hgb 14.1; hct 42.7; mcv 95.2; plt 214; glucose 275; bun 18.5; creat 0.86; k+ 4.4; an++ 140; ca 9.4  06-01-17: urine micro-albumin 1.2 06-29-17: wbc 8.1; hgb 12.2; hct 37; plt 192; vit B 12: 952 07-07-17: wbc 16.8; hgb 14.2 hct 44.9; mcv 100.7; plt 194; glucose 290; bun 38; creat 1.19; k+ 4.1; na++ 158; ca 9.8; total bili 1.3; albumin 3.5 07-08-17: wbc 12.6; hgb 11.7; hct 37.4; mcv 101.1; plt 174 glucose  280; bun 34; creat 1.19; k+ 3.6; na++ 155; ca 8.1;  liver normal albumin 2.5 07-10-17: glucose 202; bun 15; creat 0.78; k+ 3.4; na++144 07-17-17: wbc 11.2; hgb 11.2; hct 34; plt 328; glucose 310; bun 18; creat 0.8; k+ 3.8; na++144 08-02-17: wbc 12.7; hgb 10.6; hct 32.6; mcv 95.7; plt 270; glucose 153; bun 17.1; creat 0.43; k+ 3.7; na++ 158; liver normal albumin 2.8  08-03-17: glucose 30; bun 15.4; creat 0.57; k+ 3.7; na++ 153; ca 9.0    TODAY:   08-10-17: glucose 306; bun 40; creat 0.46; k+ 3.7; na++138; ca 8.5    Review of Systems  Unable to perform ROS: Dementia (nonverbal )    Physical Exam  Constitutional: No distress.  Cachexia   Neck: No thyromegaly present.  Cardiovascular: Normal rate, regular rhythm, normal heart sounds and intact distal pulses.  Pulmonary/Chest: Effort normal and breath sounds normal. No respiratory distress.  Abdominal: Soft. Bowel sounds are normal. She exhibits no distension. There is no tenderness.  Musculoskeletal: She exhibits no edema.  Is not moving extremities   Lymphadenopathy:    She has no cervical adenopathy.  Neurological:  Not aware  Skin: Skin is warm and dry. She is not diaphoretic.    ASSESSMENT/ PLAN:  TODAY:   1. Essential hypertension: stable will continue norvasc 5 mg daily; toprol xl 25 mg daily  and apresoline 10 mg three times daily   2. Insulin dependent type 2 diabetes mellitus uncontrolled: is stable   hgb a1c 5.6; is presently off medications.   3. Hypernatremia: without  change; will stop IVF will repeat cbc bmp on 08-14-17;   PREVIOUS  4. Osteoporosis, age related: stable will continue  calcium supplement.fosamax has been stopped  5. Dementia, alzheimer's with behavior disturbance; without change weight is 121 pounds is losing weight ; aricept has been stopped.   6.  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 gerd without esophagitis: stable will continue prilosec 40 mg daily     MD is aware of resident's narcotic use and is in agreement with current plan of care. We will attempt to wean resident as apropriate   Synthia Innocent NP [facility]  Contact [phone] Monday through Friday 8am- 5pm  After hours call [phone]

## 2017-08-15 ENCOUNTER — Encounter: Payer: Self-pay | Admitting: Adult Health

## 2017-08-15 ENCOUNTER — Non-Acute Institutional Stay (SKILLED_NURSING_FACILITY): Payer: Medicare Other | Admitting: Adult Health

## 2017-08-15 DIAGNOSIS — N170 Acute kidney failure with tubular necrosis: Secondary | ICD-10-CM | POA: Diagnosis not present

## 2017-08-15 DIAGNOSIS — F0281 Dementia in other diseases classified elsewhere with behavioral disturbance: Secondary | ICD-10-CM

## 2017-08-15 DIAGNOSIS — G301 Alzheimer's disease with late onset: Secondary | ICD-10-CM

## 2017-08-15 DIAGNOSIS — F02818 Dementia in other diseases classified elsewhere, unspecified severity, with other behavioral disturbance: Secondary | ICD-10-CM

## 2017-08-15 NOTE — Progress Notes (Signed)
PALLIATIVE CARE CONSULT VISIT   PATIENT NAME: [name] DOB: Feb 27, 1940 MRN: [id number]  PRIMARY CARE PROVIDER:   Dr. Laurann Montanaynthia White  REFERRING PROVIDER:  Wyatt Portelaebbie Green, NP               [facility]  RESPONSIBLE PARTY:    Elonda HuskyCassandra Zanders(daughter)  [phone]    RECOMMENDATIONS and PLAN:  1.  Weakness generalized  R53.1: Unchanged even with use of recurrent IV infusions.   Multifactorial as related to end stage dementia, dehydration,    FAST stage 7c.  Bedbound.   2.  Palliative care encounter Z51.5:  Per provider, daughter requested to continue IV fluid infusions and maintain code status as attempt CPR.  Pt. Remains in a poor state of health and goals for improvement may only be short term if any.  Her nutritional intake has not improved.  We will continue to support patient and family.  Provide comfort measures.  Pt is quite appropriate for transition to Hospice care if daughter changes her mind.   I spent 15 minutes providing this consultation,  From 11:00am to 11:15am [facility]. More than 50% of the time in this consultation was spent coordinating communication patient and facility staff.   HISTORY OF PRESENT ILLNESS: Followup with [name] finds that she remains in a state of decline.  She continues to pocket food and is dehydrated despite IV infusions.  No improvement of overall health and remains in a total care status.  CODE STATUS: FULL CODE  PPS: 20%  Sips only.  Pocketing food HOSPICE ELIGIBILITY/DIAGNOSIS: YES/ protein calorie malnutrition/advanced dementia  PAST MEDICAL HISTORY:  Past Medical History:  Diagnosis Date  . 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 Anxiety   . Blepharitis   . Dementia   . Depression   . Diabetes mellitus   . Essential hypertension 01/12/2009   Qualifier: Diagnosis of  By: Alvester MorinNewton MD, Viviann SpareSteven    . GERD (gastroesophageal reflux disease)   . Heart murmur, systolic   . Hepatitis   . Hyperlipidemia   .  Hypertension   . Osteoporosis   . Urinary frequency     SOCIAL HX:  Social History   Tobacco Use  . Smoking status: Never Smoker  . Smokeless tobacco: Never Used  Substance Use Topics  . Alcohol use: No    Alcohol/week: 0.0 oz    ALLERGIES:  Allergies  Allergen Reactions  . Losartan Swelling    angioedema     PERTINENT MEDICATIONS:  Outpatient Encounter Medications as of 08/02/2017  Medication Sig  . alendronate (FOSAMAX) 70 MG tablet Take 70 mg by mouth every Tuesday. Take with a full glass of water on an empty stomach.   Marland Kitchen. amLODipine (NORVASC) 5 MG tablet Take 5 mg by mouth daily.  . calcium-vitamin D (OSCAL WITH D) 500-200 MG-UNIT per tablet Take 1 tablet by mouth daily.  Marland Kitchen. donepezil (ARICEPT) 5 MG tablet Take 1 tablet (5 mg total) by mouth at bedtime.  Marland Kitchen. GLUCERNA (GLUCERNA) LIQD Take 120 mLs by mouth 3 (three) times daily between meals. With meals   . hydrALAZINE (APRESOLINE) 10 MG tablet Take 10 mg by mouth 3 (three) times daily.  . insulin aspart (NOVOLOG FLEXPEN) 100 UNIT/ML FlexPen Inject 8 Units into the skin 3 (three) times daily with meals. <60 or >400 notify MD  . Insulin Glargine (LANTUS SOLOSTAR) 100 UNIT/ML Solostar Pen Inject 24 Units into the skin daily at 10 pm.   . linagliptin (TRADJENTA) 5  MG TABS tablet Take 5 mg by mouth daily.  . memantine (NAMENDA XR) 28 MG CP24 24 hr capsule Take 28 mg by mouth daily.  . metFORMIN (GLUCOPHAGE-XR) 500 MG 24 hr tablet Take 500 mg by mouth every evening.  . metoprolol succinate (TOPROL-XL) 25 MG 24 hr tablet Take 25 mg by mouth daily.  . mirtazapine (REMERON) 15 MG tablet Take 15 mg by mouth at bedtime.  . Multiple Vitamin (MULTIVITAMIN) tablet Take 1 tablet by mouth daily.  . Nutritional Supplements (NUTRITIONAL SUPPLEMENT PO) Low Concentrated Sweets diet - Pureed texture, Regular consistency  . omeprazole (PRILOSEC) 40 MG capsule Take 1 capsule (40 mg total) by mouth daily.   No facility-administered encounter  medications on file as of 08/02/2017.     PHYSICAL EXAM:     P 96 R 24    Sats 96% on rm air  General: Cachectic. Frail and fragile appearing. Reclining in bed.  In NAD. Cardiovascular: Systolic murmur, RRR Pulmonary: clear ant fields Abdomen: flat, soft, nontender, + bowel sounds Extremities: PICC line in place at RUE. IV of 1/2 normal saline IV infusion noted Skin: Poor skin turgor. Warm and dry. Exposed skin is intact Neurological:Somnolent.  Opens eyes, speaks in 1-2 words.  Unable to determine orientation due to cognitive deficits.  Does not follow commands.Weakness   Michaela SheffieldShirley Lacee Grey, NP-C

## 2017-08-15 NOTE — Progress Notes (Signed)
[facility]   [facility] Room Number: 222 B Place of Service:  SNF (31)   CODE STATUS: DNR (Effective 08/15/17)  Allergies  Allergen Reactions  . Losartan Swelling    angioedema    Chief Complaint  Patient presents with  . Acute Visit    Change in Status    HPI:  She has had a change in her status. She is not eating or drinking. She is nonverbal and is unaware of surroundings. Her daughter is present. We have had a prolonged discussion about her quality of life. Her state is terminal. She had agreed to make her a DNR at this time. She is struggling to make these decisions. She has agreed to a hospice consult. She is unable to take her medications at this time.   Past Medical History:  Diagnosis Date  . 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 Anxiety   . Blepharitis   . Dementia   . Depression   . Diabetes mellitus   . Essential hypertension 01/12/2009   Qualifier: Diagnosis of  By: Alvester MorinNewton MD, Viviann SpareSteven    . GERD (gastroesophageal reflux disease)   . Heart murmur, systolic   . Hepatitis   . Hyperlipidemia   . Hypertension   . Osteoporosis   . Urinary frequency     Past Surgical History:  Procedure Laterality Date  . COLONOSCOPY N/A 02/16/2012   Procedure: COLONOSCOPY;  Surgeon: Vertell NovakJames L Edwards Jr., MD;  [facility];  Service: Endoscopy;  Laterality: N/A;  . ESOPHAGOGASTRODUODENOSCOPY N/A 02/16/2012   Procedure: ESOPHAGOGASTRODUODENOSCOPY (EGD);  Surgeon: Vertell NovakJames L Edwards Jr., MD;  [facility];  Service: Endoscopy;  Laterality: N/A;  . THYROIDECTOMY     in 2000    Social History   Socioeconomic History  . Marital status: Widowed    Spouse name: Not on file  . Number of children: Not on file  . Years of education: 3712  . Highest education level: Not on file  Occupational History  . Not on file  Social Needs  . Financial resource strain: Not on file  . Food insecurity:    Worry: Not on file    Inability: Not on file    . Transportation needs:    Medical: Not on file    Non-medical: Not on file  Tobacco Use  . Smoking status: Never Smoker  . Smokeless tobacco: Never Used  Substance and Sexual Activity  . Alcohol use: No    Alcohol/week: 0.0 standard drinks  . Drug use: No  . Sexual activity: Never  Lifestyle  . Physical activity:    Days per week: Not on file    Minutes per session: Not on file  . Stress: Not on file  Relationships  . Social connections:    Talks on phone: Not on file    Gets together: Not on file    Attends religious service: Not on file    Active member of club or organization: Not on file    Attends meetings of clubs or organizations: Not on file    Relationship status: Not on file  . Intimate partner violence:    Fear of current or ex partner: Not on file    Emotionally abused: Not on file    Physically abused: Not on file    Forced sexual activity: Not on file  Other Topics Concern  . Not on file  Social History Narrative   Lives with daughter and grand-daughter to help informally supervise pt at  home.   Pt also in adult daycare during the week.     Family History  Problem Relation Age of Onset  . Cancer Sister        brain  . Diabetes Son   . Hypertension Son   . Diabetes Daughter       VITAL SIGNS BP 118/73   Pulse (!) 113   Temp 98.8 F (37.1 C)   Resp 16   Ht 5\' 1"  (1.549 m)   Wt 117 lb (53.1 kg)   SpO2 97%   BMI 22.11 kg/m   Outpatient Encounter Medications as of 08/15/2017  Medication Sig  . amLODipine (NORVASC) 5 MG tablet Take 5 mg by mouth daily.  . calcium-vitamin D (OSCAL WITH D) 500-200 MG-UNIT per tablet Take 1 tablet by mouth daily.  . Emollient (SKIN REPAIR EX) Apply to blister on left wrist daily  . GLUCERNA (GLUCERNA) LIQD Take 120 mLs by mouth 3 (three) times daily between meals. With meals   . hydrALAZINE (APRESOLINE) 10 MG tablet Take 10 mg by mouth 3 (three) times daily.  . metoprolol succinate (TOPROL-XL) 25 MG 24 hr tablet  Take 25 mg by mouth daily.  . Multiple Vitamin (MULTIVITAMIN) tablet Take 1 tablet by mouth daily.  . Nutritional Supplements (NUTRITIONAL SUPPLEMENT PO) Low Concentrated Sweets diet - Pureed texture, Regular consistency  . omeprazole (PRILOSEC) 40 MG capsule Take 1 capsule (40 mg total) by mouth daily.  . [DISCONTINUED] Nutritional Supplements (NUTRITIONAL SUPPLEMENT PO) Low concentrated Sweets Diet - Pureed texture, Regular consistency   No facility-administered encounter medications on file as of 08/15/2017.      SIGNIFICANT DIAGNOSTIC EXAMS   PREVIOUS:  04-24-17: chest x-ray: No active cardiopulmonary disease.   04-24-17: ct of head: Atrophy and small vessel disease, progressive since 2007. No acute intracranial findings.  04-24-17: ct of abdomen and pelvis: 1. Mild diffuse small bowel distention without mechanical bowel obstruction compatible with a mild small bowel enteritis. 2. Coronary arteriosclerosis. 3. Punctate nonobstructing right lower pole renal calculus. No hydroureteronephrosis. 4. Fibroid uterus.  07-07-17: Chest x-ray: Shallow inspiration with linear atelectasis or fibrosis in the lung bases. No focal consolidation.   NO NEW EXAMS    LABS REVIEWED; PEVIOUS  04-24-17: wbc 9.5; hgb 15.1; hct 45.0; mcv 247; plt 247 glucose 177 bun 30; creat 1.04; k+ 4.7; na++ 145; ca 9.7; liver normal albumin 4.0; urine culture no growth 04-25-17: wbc 7.3; hgb 13.3; hct 40.0; mcv 95.7; plt 194; glucose 105; bun 24; creat 0.97; k+ 4.2; na++ 143; ca 8.6; liver normal albumin 3.2; mag 1.7; phos 2.2; hgb a1c 5.6  05-04-17: wbc 14.2; hgb 14.1; hct 42.7; mcv 95.2; plt 214; glucose 275; bun 18.5; creat 0.86; k+ 4.4; an++ 140; ca 9.4  06-01-17: urine micro-albumin 1.2 06-29-17: wbc 8.1; hgb 12.2; hct 37; plt 192; vit B 12: 952 07-07-17: wbc 16.8; hgb 14.2 hct 44.9; mcv 100.7; plt 194; glucose 290; bun 38; creat 1.19; k+ 4.1; na++ 158; ca 9.8; total bili 1.3; albumin 3.5 07-08-17: wbc 12.6; hgb 11.7; hct  37.4; mcv 101.1; plt 174 glucose  280; bun 34; creat 1.19; k+ 3.6; na++ 155; ca 8.1; liver normal albumin 2.5 07-10-17: glucose 202; bun 15; creat 0.78; k+ 3.4; na++144 07-17-17: wbc 11.2; hgb 11.2; hct 34; plt 328; glucose 310; bun 18; creat 0.8; k+ 3.8; na++144 08-02-17: wbc 12.7; hgb 10.6; hct 32.6; mcv 95.7; plt 270; glucose 153; bun 17.1; creat 0.43; k+ 3.7; na++ 158; liver normal albumin 2.8  08-03-17: glucose 30; bun 15.4; creat 0.57; k+ 3.7; na++ 153; ca 9.0   08-10-17: glucose 306; bun 40; creat 0.46; k+ 3.7; na++138; ca 8.5   NO NEW LABS.    Review of Systems  Unable to perform ROS: Dementia (nonresponsive )    Physical Exam  Constitutional: No distress.  Cachexia   HENT:  Mouth is dry   Neck: No thyromegaly present.  Cardiovascular: Normal rate, regular rhythm, normal heart sounds and intact distal pulses.  Pulmonary/Chest: Effort normal and breath sounds normal. No respiratory distress.  Abdominal: Soft. Bowel sounds are normal. She exhibits no distension.  Musculoskeletal: She exhibits no edema.  Lymphadenopathy:    She has no cervical adenopathy.  Neurological:  Non-responsive   Skin: Skin is warm and dry. She is not diaphoretic.  Poor skin turgor    ASSESSMENT/ PLAN:  TODAY:   1. Late onset dementia, alzheimer's with behavior disturbance  2. ARF  Will stop her medications at this time Will setup a hospice consult Will initiate DNR    MD is aware of resident's narcotic use and is in agreement with current plan of care. We will attempt to wean resident as apropriate   Synthia Innocent NP [facility] Adult Medicine  Contact [phone] Monday through Friday 8am- 5pm  After hours call [phone]

## 2017-09-03 DEATH — deceased
